# Patient Record
Sex: Female | Born: 1935 | Race: White | Hispanic: No | Marital: Married | Smoking: Never smoker
Health system: Southern US, Community
[De-identification: ages and names within clinical notes are randomized; demographics above are authoritative.]

## PROBLEM LIST (undated history)

## (undated) DIAGNOSIS — T8859XA Other complications of anesthesia, initial encounter: Secondary | ICD-10-CM

## (undated) DIAGNOSIS — I495 Sick sinus syndrome: Secondary | ICD-10-CM

## (undated) DIAGNOSIS — N183 Chronic kidney disease, stage 3 unspecified: Secondary | ICD-10-CM

## (undated) DIAGNOSIS — I209 Angina pectoris, unspecified: Secondary | ICD-10-CM

## (undated) DIAGNOSIS — M1990 Unspecified osteoarthritis, unspecified site: Secondary | ICD-10-CM

## (undated) DIAGNOSIS — M4800 Spinal stenosis, site unspecified: Secondary | ICD-10-CM

## (undated) DIAGNOSIS — I8390 Asymptomatic varicose veins of unspecified lower extremity: Secondary | ICD-10-CM

## (undated) DIAGNOSIS — I10 Essential (primary) hypertension: Secondary | ICD-10-CM

## (undated) DIAGNOSIS — R5383 Other fatigue: Secondary | ICD-10-CM

## (undated) DIAGNOSIS — F0280 Dementia in other diseases classified elsewhere without behavioral disturbance: Secondary | ICD-10-CM

## (undated) DIAGNOSIS — Z950 Presence of cardiac pacemaker: Secondary | ICD-10-CM

## (undated) DIAGNOSIS — D649 Anemia, unspecified: Secondary | ICD-10-CM

## (undated) DIAGNOSIS — E119 Type 2 diabetes mellitus without complications: Secondary | ICD-10-CM

## (undated) DIAGNOSIS — K219 Gastro-esophageal reflux disease without esophagitis: Secondary | ICD-10-CM

## (undated) DIAGNOSIS — K5790 Diverticulosis of intestine, part unspecified, without perforation or abscess without bleeding: Secondary | ICD-10-CM

## (undated) DIAGNOSIS — T4145XA Adverse effect of unspecified anesthetic, initial encounter: Secondary | ICD-10-CM

## (undated) DIAGNOSIS — C50919 Malignant neoplasm of unspecified site of unspecified female breast: Secondary | ICD-10-CM

## (undated) DIAGNOSIS — G309 Alzheimer's disease, unspecified: Secondary | ICD-10-CM

## (undated) DIAGNOSIS — I482 Chronic atrial fibrillation, unspecified: Secondary | ICD-10-CM

## (undated) DIAGNOSIS — N200 Calculus of kidney: Secondary | ICD-10-CM

## (undated) DIAGNOSIS — Z8673 Personal history of transient ischemic attack (TIA), and cerebral infarction without residual deficits: Secondary | ICD-10-CM

## (undated) DIAGNOSIS — N390 Urinary tract infection, site not specified: Secondary | ICD-10-CM

## (undated) DIAGNOSIS — E039 Hypothyroidism, unspecified: Secondary | ICD-10-CM

## (undated) HISTORY — DX: Chronic atrial fibrillation, unspecified: I48.20

## (undated) HISTORY — PX: INSERT / REPLACE / REMOVE PACEMAKER: SUR710

## (undated) HISTORY — DX: Personal history of transient ischemic attack (TIA), and cerebral infarction without residual deficits: Z86.73

## (undated) HISTORY — DX: Presence of cardiac pacemaker: Z95.0

## (undated) HISTORY — DX: Asymptomatic varicose veins of unspecified lower extremity: I83.90

## (undated) HISTORY — DX: Diverticulosis of intestine, part unspecified, without perforation or abscess without bleeding: K57.90

## (undated) HISTORY — DX: Anemia, unspecified: D64.9

## (undated) HISTORY — PX: BREAST LUMPECTOMY: SHX2

## (undated) HISTORY — DX: Other fatigue: R53.83

## (undated) HISTORY — DX: Spinal stenosis, site unspecified: M48.00

## (undated) HISTORY — DX: Gastro-esophageal reflux disease without esophagitis: K21.9

## (undated) HISTORY — PX: TONSILLECTOMY: SUR1361

## (undated) HISTORY — DX: Sick sinus syndrome: I49.5

## (undated) HISTORY — DX: Urinary tract infection, site not specified: N39.0

## (undated) HISTORY — DX: Essential (primary) hypertension: I10

## (undated) HISTORY — DX: Unspecified osteoarthritis, unspecified site: M19.90

## (undated) HISTORY — PX: EYE SURGERY: SHX253

---
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
## 1998-02-03 ENCOUNTER — Other Ambulatory Visit: Admission: RE | Admit: 1998-02-03 | Discharge: 1998-02-03 | Payer: Self-pay | Admitting: *Deleted

## 1998-02-22 ENCOUNTER — Inpatient Hospital Stay (HOSPITAL_COMMUNITY): Admission: EM | Admit: 1998-02-22 | Discharge: 1998-03-02 | Payer: Self-pay | Admitting: Emergency Medicine

## 1998-03-20 ENCOUNTER — Ambulatory Visit (HOSPITAL_COMMUNITY): Admission: RE | Admit: 1998-03-20 | Discharge: 1998-03-20 | Payer: Self-pay | Admitting: *Deleted

## 1998-04-22 ENCOUNTER — Ambulatory Visit (HOSPITAL_BASED_OUTPATIENT_CLINIC_OR_DEPARTMENT_OTHER): Admission: RE | Admit: 1998-04-22 | Discharge: 1998-04-22 | Payer: Self-pay | Admitting: Orthopedic Surgery

## 1998-06-18 ENCOUNTER — Ambulatory Visit (HOSPITAL_COMMUNITY): Admission: RE | Admit: 1998-06-18 | Discharge: 1998-06-18 | Payer: Self-pay | Admitting: Cardiology

## 1998-06-18 ENCOUNTER — Encounter: Payer: Self-pay | Admitting: Cardiology

## 1998-07-22 ENCOUNTER — Ambulatory Visit (HOSPITAL_COMMUNITY): Admission: RE | Admit: 1998-07-22 | Discharge: 1998-07-22 | Payer: Self-pay | Admitting: *Deleted

## 1998-10-21 ENCOUNTER — Encounter: Payer: Self-pay | Admitting: *Deleted

## 1998-10-21 ENCOUNTER — Ambulatory Visit (HOSPITAL_COMMUNITY): Admission: RE | Admit: 1998-10-21 | Discharge: 1998-10-21 | Payer: Self-pay | Admitting: *Deleted

## 1999-03-11 ENCOUNTER — Other Ambulatory Visit: Admission: RE | Admit: 1999-03-11 | Discharge: 1999-03-11 | Payer: Self-pay | Admitting: *Deleted

## 1999-08-24 ENCOUNTER — Encounter: Admission: RE | Admit: 1999-08-24 | Discharge: 1999-08-24 | Payer: Self-pay | Admitting: *Deleted

## 1999-08-24 ENCOUNTER — Encounter: Payer: Self-pay | Admitting: *Deleted

## 1999-10-23 ENCOUNTER — Ambulatory Visit (HOSPITAL_COMMUNITY): Admission: RE | Admit: 1999-10-23 | Discharge: 1999-10-23 | Payer: Self-pay | Admitting: Cardiology

## 1999-10-23 ENCOUNTER — Encounter: Payer: Self-pay | Admitting: Cardiology

## 2000-03-23 ENCOUNTER — Other Ambulatory Visit: Admission: RE | Admit: 2000-03-23 | Discharge: 2000-03-23 | Payer: Self-pay | Admitting: *Deleted

## 2000-03-28 ENCOUNTER — Encounter: Admission: RE | Admit: 2000-03-28 | Discharge: 2000-03-28 | Payer: Self-pay | Admitting: *Deleted

## 2000-03-28 ENCOUNTER — Encounter: Payer: Self-pay | Admitting: *Deleted

## 2000-04-28 ENCOUNTER — Encounter: Payer: Self-pay | Admitting: Emergency Medicine

## 2000-04-28 ENCOUNTER — Inpatient Hospital Stay (HOSPITAL_COMMUNITY): Admission: EM | Admit: 2000-04-28 | Discharge: 2000-04-30 | Payer: Self-pay | Admitting: Emergency Medicine

## 2000-04-29 ENCOUNTER — Encounter: Payer: Self-pay | Admitting: *Deleted

## 2000-07-01 ENCOUNTER — Encounter: Payer: Self-pay | Admitting: Neurology

## 2000-07-01 ENCOUNTER — Encounter: Admission: RE | Admit: 2000-07-01 | Discharge: 2000-07-01 | Payer: Self-pay | Admitting: Neurology

## 2000-08-14 ENCOUNTER — Inpatient Hospital Stay (HOSPITAL_COMMUNITY): Admission: AD | Admit: 2000-08-14 | Discharge: 2000-08-15 | Payer: Self-pay | Admitting: Internal Medicine

## 2000-08-14 ENCOUNTER — Encounter: Payer: Self-pay | Admitting: Internal Medicine

## 2000-09-09 ENCOUNTER — Ambulatory Visit (HOSPITAL_COMMUNITY): Admission: RE | Admit: 2000-09-09 | Discharge: 2000-09-09 | Payer: Self-pay | Admitting: Cardiology

## 2001-04-25 ENCOUNTER — Encounter: Payer: Self-pay | Admitting: Cardiology

## 2001-04-25 ENCOUNTER — Ambulatory Visit (HOSPITAL_COMMUNITY): Admission: RE | Admit: 2001-04-25 | Discharge: 2001-04-25 | Payer: Self-pay | Admitting: Cardiology

## 2001-05-05 ENCOUNTER — Other Ambulatory Visit: Admission: RE | Admit: 2001-05-05 | Discharge: 2001-05-05 | Payer: Self-pay | Admitting: *Deleted

## 2001-06-06 ENCOUNTER — Encounter: Payer: Self-pay | Admitting: Cardiology

## 2001-06-06 ENCOUNTER — Encounter: Payer: Self-pay | Admitting: *Deleted

## 2001-06-06 ENCOUNTER — Emergency Department (HOSPITAL_COMMUNITY): Admission: EM | Admit: 2001-06-06 | Discharge: 2001-06-06 | Payer: Self-pay | Admitting: *Deleted

## 2001-12-20 ENCOUNTER — Encounter: Payer: Self-pay | Admitting: *Deleted

## 2001-12-20 ENCOUNTER — Ambulatory Visit (HOSPITAL_COMMUNITY): Admission: RE | Admit: 2001-12-20 | Discharge: 2001-12-20 | Payer: Self-pay | Admitting: *Deleted

## 2002-01-02 ENCOUNTER — Inpatient Hospital Stay (HOSPITAL_COMMUNITY): Admission: RE | Admit: 2002-01-02 | Discharge: 2002-01-03 | Payer: Self-pay | Admitting: Family Medicine

## 2002-01-03 ENCOUNTER — Encounter: Payer: Self-pay | Admitting: Internal Medicine

## 2002-01-11 ENCOUNTER — Encounter: Admission: RE | Admit: 2002-01-11 | Discharge: 2002-01-11 | Payer: Self-pay | Admitting: *Deleted

## 2002-01-11 ENCOUNTER — Encounter: Payer: Self-pay | Admitting: *Deleted

## 2002-04-18 ENCOUNTER — Inpatient Hospital Stay (HOSPITAL_COMMUNITY): Admission: AD | Admit: 2002-04-18 | Discharge: 2002-04-20 | Payer: Self-pay | Admitting: Cardiology

## 2002-05-16 ENCOUNTER — Other Ambulatory Visit: Admission: RE | Admit: 2002-05-16 | Discharge: 2002-05-16 | Payer: Self-pay | Admitting: *Deleted

## 2002-06-08 ENCOUNTER — Encounter: Payer: Self-pay | Admitting: Emergency Medicine

## 2002-06-09 ENCOUNTER — Inpatient Hospital Stay (HOSPITAL_COMMUNITY): Admission: EM | Admit: 2002-06-09 | Discharge: 2002-06-11 | Payer: Self-pay | Admitting: Emergency Medicine

## 2002-06-09 ENCOUNTER — Encounter: Payer: Self-pay | Admitting: Endocrinology

## 2002-06-16 ENCOUNTER — Emergency Department (HOSPITAL_COMMUNITY): Admission: EM | Admit: 2002-06-16 | Discharge: 2002-06-16 | Payer: Self-pay | Admitting: Emergency Medicine

## 2002-06-16 ENCOUNTER — Encounter: Payer: Self-pay | Admitting: Emergency Medicine

## 2002-10-02 ENCOUNTER — Encounter: Admission: RE | Admit: 2002-10-02 | Discharge: 2002-10-02 | Payer: Self-pay | Admitting: Family Medicine

## 2002-10-02 ENCOUNTER — Encounter: Payer: Self-pay | Admitting: Family Medicine

## 2003-06-06 ENCOUNTER — Encounter: Payer: Self-pay | Admitting: Cardiology

## 2003-06-06 ENCOUNTER — Inpatient Hospital Stay (HOSPITAL_COMMUNITY): Admission: RE | Admit: 2003-06-06 | Discharge: 2003-06-08 | Payer: Self-pay | Admitting: Cardiology

## 2003-06-07 ENCOUNTER — Encounter: Payer: Self-pay | Admitting: Cardiology

## 2003-06-08 ENCOUNTER — Encounter: Payer: Self-pay | Admitting: Cardiology

## 2004-09-01 ENCOUNTER — Other Ambulatory Visit: Admission: RE | Admit: 2004-09-01 | Discharge: 2004-09-01 | Payer: Self-pay | Admitting: *Deleted

## 2004-09-22 ENCOUNTER — Encounter: Admission: RE | Admit: 2004-09-22 | Discharge: 2004-09-22 | Payer: Self-pay | Admitting: Family Medicine

## 2005-05-15 ENCOUNTER — Emergency Department (HOSPITAL_COMMUNITY): Admission: EM | Admit: 2005-05-15 | Discharge: 2005-05-15 | Payer: Self-pay | Admitting: Emergency Medicine

## 2005-08-31 ENCOUNTER — Encounter: Admission: RE | Admit: 2005-08-31 | Discharge: 2005-08-31 | Payer: Self-pay | Admitting: Family Medicine

## 2005-10-21 ENCOUNTER — Encounter: Admission: RE | Admit: 2005-10-21 | Discharge: 2005-10-21 | Payer: Self-pay | Admitting: Orthopedic Surgery

## 2006-01-18 ENCOUNTER — Other Ambulatory Visit: Admission: RE | Admit: 2006-01-18 | Discharge: 2006-01-18 | Payer: Self-pay | Admitting: Obstetrics & Gynecology

## 2006-03-07 ENCOUNTER — Ambulatory Visit (HOSPITAL_COMMUNITY): Admission: RE | Admit: 2006-03-07 | Discharge: 2006-03-07 | Payer: Self-pay | Admitting: Orthopaedic Surgery

## 2006-04-07 ENCOUNTER — Encounter: Admission: RE | Admit: 2006-04-07 | Discharge: 2006-04-07 | Payer: Self-pay | Admitting: Orthopaedic Surgery

## 2006-05-18 ENCOUNTER — Encounter (INDEPENDENT_AMBULATORY_CARE_PROVIDER_SITE_OTHER): Payer: Self-pay | Admitting: Diagnostic Radiology

## 2006-05-18 ENCOUNTER — Encounter (INDEPENDENT_AMBULATORY_CARE_PROVIDER_SITE_OTHER): Payer: Self-pay | Admitting: Specialist

## 2006-05-18 ENCOUNTER — Encounter: Admission: RE | Admit: 2006-05-18 | Discharge: 2006-05-18 | Payer: Self-pay | Admitting: Family Medicine

## 2006-06-14 ENCOUNTER — Encounter: Admission: RE | Admit: 2006-06-14 | Discharge: 2006-06-14 | Payer: Self-pay | Admitting: Surgery

## 2006-06-15 ENCOUNTER — Ambulatory Visit (HOSPITAL_BASED_OUTPATIENT_CLINIC_OR_DEPARTMENT_OTHER): Admission: RE | Admit: 2006-06-15 | Discharge: 2006-06-15 | Payer: Self-pay | Admitting: Surgery

## 2006-06-15 ENCOUNTER — Encounter (INDEPENDENT_AMBULATORY_CARE_PROVIDER_SITE_OTHER): Payer: Self-pay | Admitting: *Deleted

## 2006-06-15 ENCOUNTER — Encounter: Admission: RE | Admit: 2006-06-15 | Discharge: 2006-06-15 | Payer: Self-pay | Admitting: Surgery

## 2006-07-05 ENCOUNTER — Ambulatory Visit: Payer: Self-pay | Admitting: Oncology

## 2006-07-20 LAB — COMPREHENSIVE METABOLIC PANEL
Albumin: 3.9 g/dL (ref 3.5–5.2)
Alkaline Phosphatase: 92 U/L (ref 39–117)
CO2: 27 mEq/L (ref 19–32)
Glucose, Bld: 121 mg/dL — ABNORMAL HIGH (ref 70–99)
Potassium: 4.4 mEq/L (ref 3.5–5.3)
Sodium: 136 mEq/L (ref 135–145)
Total Protein: 6.4 g/dL (ref 6.0–8.3)

## 2006-07-20 LAB — CBC WITH DIFFERENTIAL/PLATELET
Eosinophils Absolute: 0.1 10*3/uL (ref 0.0–0.5)
MONO#: 0.6 10*3/uL (ref 0.1–0.9)
MONO%: 12.5 % (ref 0.0–13.0)
NEUT#: 3.2 10*3/uL (ref 1.5–6.5)
RBC: 3.68 10*6/uL — ABNORMAL LOW (ref 3.70–5.32)
RDW: 15.6 % — ABNORMAL HIGH (ref 11.3–14.5)
WBC: 4.8 10*3/uL (ref 3.9–10.0)
lymph#: 0.9 10*3/uL (ref 0.9–3.3)

## 2006-07-20 LAB — LACTATE DEHYDROGENASE: LDH: 221 U/L (ref 94–250)

## 2006-08-31 ENCOUNTER — Encounter: Admission: RE | Admit: 2006-08-31 | Discharge: 2006-08-31 | Payer: Self-pay | Admitting: Family Medicine

## 2006-10-09 ENCOUNTER — Inpatient Hospital Stay (HOSPITAL_COMMUNITY): Admission: EM | Admit: 2006-10-09 | Discharge: 2006-10-10 | Payer: Self-pay | Admitting: Emergency Medicine

## 2006-10-19 ENCOUNTER — Ambulatory Visit: Payer: Self-pay | Admitting: Oncology

## 2006-11-02 ENCOUNTER — Ambulatory Visit (HOSPITAL_COMMUNITY): Admission: RE | Admit: 2006-11-02 | Discharge: 2006-11-02 | Payer: Self-pay | Admitting: Gastroenterology

## 2006-11-08 ENCOUNTER — Encounter (HOSPITAL_COMMUNITY): Admission: RE | Admit: 2006-11-08 | Discharge: 2007-02-06 | Payer: Self-pay | Admitting: Gastroenterology

## 2006-12-21 ENCOUNTER — Encounter: Admission: RE | Admit: 2006-12-21 | Discharge: 2006-12-21 | Payer: Self-pay | Admitting: Family Medicine

## 2007-01-05 ENCOUNTER — Ambulatory Visit: Payer: Self-pay | Admitting: Oncology

## 2007-01-10 LAB — COMPREHENSIVE METABOLIC PANEL
Albumin: 3.7 g/dL (ref 3.5–5.2)
Alkaline Phosphatase: 95 U/L (ref 39–117)
CO2: 27 mEq/L (ref 19–32)
Calcium: 9 mg/dL (ref 8.4–10.5)
Chloride: 101 mEq/L (ref 96–112)
Glucose, Bld: 116 mg/dL — ABNORMAL HIGH (ref 70–99)
Potassium: 4.3 mEq/L (ref 3.5–5.3)
Sodium: 138 mEq/L (ref 135–145)
Total Protein: 6.5 g/dL (ref 6.0–8.3)

## 2007-01-10 LAB — CBC WITH DIFFERENTIAL/PLATELET
Basophils Absolute: 0 10*3/uL (ref 0.0–0.1)
Eosinophils Absolute: 0 10*3/uL (ref 0.0–0.5)
HGB: 10.5 g/dL — ABNORMAL LOW (ref 11.6–15.9)
MONO#: 0.6 10*3/uL (ref 0.1–0.9)
NEUT#: 2.5 10*3/uL (ref 1.5–6.5)
RBC: 3.86 10*6/uL (ref 3.70–5.32)
RDW: 16.6 % — ABNORMAL HIGH (ref 11.3–14.5)
WBC: 4.2 10*3/uL (ref 3.9–10.0)
lymph#: 1 10*3/uL (ref 0.9–3.3)

## 2007-01-10 LAB — CANCER ANTIGEN 27.29: CA 27.29: 41 U/mL — ABNORMAL HIGH (ref 0–39)

## 2007-01-27 ENCOUNTER — Other Ambulatory Visit: Admission: RE | Admit: 2007-01-27 | Discharge: 2007-01-27 | Payer: Self-pay | Admitting: Obstetrics & Gynecology

## 2007-04-18 ENCOUNTER — Encounter: Admission: RE | Admit: 2007-04-18 | Discharge: 2007-04-18 | Payer: Self-pay | Admitting: Cardiology

## 2007-06-26 ENCOUNTER — Encounter: Admission: RE | Admit: 2007-06-26 | Discharge: 2007-06-26 | Payer: Self-pay | Admitting: Family Medicine

## 2007-07-28 ENCOUNTER — Encounter: Admission: RE | Admit: 2007-07-28 | Discharge: 2007-07-28 | Payer: Self-pay | Admitting: Obstetrics & Gynecology

## 2007-09-10 ENCOUNTER — Inpatient Hospital Stay (HOSPITAL_COMMUNITY): Admission: EM | Admit: 2007-09-10 | Discharge: 2007-09-11 | Payer: Self-pay | Admitting: Emergency Medicine

## 2008-02-27 ENCOUNTER — Ambulatory Visit: Payer: Self-pay | Admitting: Oncology

## 2008-04-03 ENCOUNTER — Ambulatory Visit: Payer: Self-pay | Admitting: Oncology

## 2008-04-05 ENCOUNTER — Encounter: Admission: RE | Admit: 2008-04-05 | Discharge: 2008-04-05 | Payer: Self-pay | Admitting: Family Medicine

## 2008-04-08 LAB — CBC WITH DIFFERENTIAL/PLATELET
BASO%: 0.4 % (ref 0.0–2.0)
EOS%: 1.1 % (ref 0.0–7.0)
MCH: 31.2 pg (ref 26.0–34.0)
MCV: 92.1 fL (ref 81.0–101.0)
MONO%: 13.9 % — ABNORMAL HIGH (ref 0.0–13.0)
RBC: 3.82 10*6/uL (ref 3.70–5.32)
RDW: 13.8 % (ref 11.3–14.5)

## 2008-05-04 ENCOUNTER — Ambulatory Visit (HOSPITAL_COMMUNITY): Admission: RE | Admit: 2008-05-04 | Discharge: 2008-05-04 | Payer: Self-pay | Admitting: Family Medicine

## 2008-07-19 ENCOUNTER — Encounter: Admission: RE | Admit: 2008-07-19 | Discharge: 2008-07-19 | Payer: Self-pay | Admitting: Obstetrics & Gynecology

## 2009-01-02 ENCOUNTER — Ambulatory Visit: Payer: Self-pay | Admitting: Oncology

## 2009-01-06 LAB — CANCER ANTIGEN 27.29: CA 27.29: 35 U/mL (ref 0–39)

## 2009-01-06 LAB — COMPREHENSIVE METABOLIC PANEL
ALT: 14 U/L (ref 0–35)
AST: 21 U/L (ref 0–37)
Albumin: 3.6 g/dL (ref 3.5–5.2)
BUN: 19 mg/dL (ref 6–23)
CO2: 29 mEq/L (ref 19–32)
Calcium: 8.7 mg/dL (ref 8.4–10.5)
Chloride: 103 mEq/L (ref 96–112)
Potassium: 4.2 mEq/L (ref 3.5–5.3)

## 2009-01-06 LAB — CBC WITH DIFFERENTIAL/PLATELET
Basophils Absolute: 0 10*3/uL (ref 0.0–0.1)
EOS%: 0.8 % (ref 0.0–7.0)
HCT: 33.9 % — ABNORMAL LOW (ref 34.8–46.6)
HGB: 11.4 g/dL — ABNORMAL LOW (ref 11.6–15.9)
MCH: 30.7 pg (ref 25.1–34.0)
MONO#: 0.5 10*3/uL (ref 0.1–0.9)
NEUT#: 2.1 10*3/uL (ref 1.5–6.5)
RDW: 13.4 % (ref 11.2–14.5)
WBC: 3.7 10*3/uL — ABNORMAL LOW (ref 3.9–10.3)
lymph#: 1 10*3/uL (ref 0.9–3.3)

## 2009-01-22 ENCOUNTER — Encounter: Admission: RE | Admit: 2009-01-22 | Discharge: 2009-01-22 | Payer: Self-pay | Admitting: Cardiology

## 2009-02-05 ENCOUNTER — Encounter: Admission: RE | Admit: 2009-02-05 | Discharge: 2009-02-05 | Payer: Self-pay | Admitting: Family Medicine

## 2009-03-28 ENCOUNTER — Emergency Department (HOSPITAL_COMMUNITY): Admission: EM | Admit: 2009-03-28 | Discharge: 2009-03-29 | Payer: Self-pay | Admitting: Emergency Medicine

## 2009-05-21 ENCOUNTER — Ambulatory Visit (HOSPITAL_COMMUNITY): Admission: RE | Admit: 2009-05-21 | Discharge: 2009-05-21 | Payer: Self-pay | Admitting: Gastroenterology

## 2009-10-29 ENCOUNTER — Encounter: Admission: RE | Admit: 2009-10-29 | Discharge: 2009-10-29 | Payer: Self-pay | Admitting: Obstetrics & Gynecology

## 2009-10-30 ENCOUNTER — Ambulatory Visit: Payer: Self-pay | Admitting: Diagnostic Radiology

## 2009-10-30 ENCOUNTER — Emergency Department (HOSPITAL_BASED_OUTPATIENT_CLINIC_OR_DEPARTMENT_OTHER)
Admission: EM | Admit: 2009-10-30 | Discharge: 2009-10-30 | Payer: Self-pay | Source: Home / Self Care | Admitting: Emergency Medicine

## 2010-01-06 ENCOUNTER — Ambulatory Visit: Payer: Self-pay | Admitting: Interventional Radiology

## 2010-01-06 ENCOUNTER — Ambulatory Visit (HOSPITAL_BASED_OUTPATIENT_CLINIC_OR_DEPARTMENT_OTHER): Admission: RE | Admit: 2010-01-06 | Discharge: 2010-01-06 | Payer: Self-pay | Admitting: Family Medicine

## 2010-01-22 ENCOUNTER — Ambulatory Visit: Payer: Self-pay | Admitting: Diagnostic Radiology

## 2010-01-22 ENCOUNTER — Ambulatory Visit (HOSPITAL_BASED_OUTPATIENT_CLINIC_OR_DEPARTMENT_OTHER)
Admission: RE | Admit: 2010-01-22 | Discharge: 2010-01-22 | Payer: Self-pay | Source: Home / Self Care | Admitting: Family Medicine

## 2010-07-07 ENCOUNTER — Inpatient Hospital Stay (HOSPITAL_COMMUNITY): Admission: EM | Admit: 2010-07-07 | Discharge: 2010-07-07 | Payer: Self-pay | Admitting: Emergency Medicine

## 2010-08-14 ENCOUNTER — Ambulatory Visit (HOSPITAL_BASED_OUTPATIENT_CLINIC_OR_DEPARTMENT_OTHER)
Admission: RE | Admit: 2010-08-14 | Discharge: 2010-08-14 | Payer: Self-pay | Source: Home / Self Care | Admitting: Family Medicine

## 2010-10-03 ENCOUNTER — Encounter: Payer: Self-pay | Admitting: Surgery

## 2010-10-04 ENCOUNTER — Encounter: Payer: Self-pay | Admitting: Family Medicine

## 2010-11-25 LAB — DIFFERENTIAL
Basophils Absolute: 0 10*3/uL (ref 0.0–0.1)
Eosinophils Absolute: 0 10*3/uL (ref 0.0–0.7)
Lymphocytes Relative: 27 % (ref 12–46)
Neutro Abs: 1.6 10*3/uL — ABNORMAL LOW (ref 1.7–7.7)
Neutrophils Relative %: 60 % (ref 43–77)

## 2010-11-25 LAB — COMPREHENSIVE METABOLIC PANEL
ALT: 12 U/L (ref 0–35)
AST: 18 U/L (ref 0–37)
BUN: 10 mg/dL (ref 6–23)
CO2: 29 mEq/L (ref 19–32)
GFR calc Af Amer: >60 mL/min (ref >60)
Glucose, Bld: 99 mg/dL (ref 70–99)
Potassium: 4 mEq/L (ref 3.5–5.1)
Total Bilirubin: 0.8 mg/dL (ref 0.3–1.2)

## 2010-11-25 LAB — POCT CARDIAC MARKERS
CKMB, poc: 1.8 ng/mL (ref 1.0–8.0)
CKMB, poc: 2 ng/mL (ref 1.0–8.0)
Myoglobin, poc: 88.6 ng/mL (ref 12–200)
Troponin i, poc: <0.05 ng/mL (ref 0.00–0.09)
Troponin i, poc: <0.05 ng/mL (ref 0.00–0.09)

## 2010-11-25 LAB — CBC
Hemoglobin: 10.5 g/dL — ABNORMAL LOW (ref 12.0–15.0)
MCH: 28.7 pg (ref 26.0–34.0)
MCHC: 31.4 g/dL (ref 30.0–36.0)
RBC: 3.66 MIL/uL — ABNORMAL LOW (ref 3.87–5.11)

## 2010-11-26 ENCOUNTER — Emergency Department (HOSPITAL_BASED_OUTPATIENT_CLINIC_OR_DEPARTMENT_OTHER)
Admission: EM | Admit: 2010-11-26 | Discharge: 2010-11-26 | Disposition: A | Discharge status: Nursing Home (Includes ICF) | Payer: Medicare Other | Source: Home / Self Care | Attending: Emergency Medicine | Admitting: Emergency Medicine

## 2010-11-26 ENCOUNTER — Observation Stay (HOSPITAL_COMMUNITY)
Admission: AD | Admit: 2010-11-26 | Discharge: 2010-11-28 | Disposition: A | Discharge status: Home / Self Care | Payer: Medicare Other | Source: Other Acute Inpatient Hospital | Attending: Internal Medicine | Admitting: Internal Medicine

## 2010-11-26 ENCOUNTER — Emergency Department (INDEPENDENT_AMBULATORY_CARE_PROVIDER_SITE_OTHER): Payer: Medicare Other

## 2010-11-26 DIAGNOSIS — D649 Anemia, unspecified: Secondary | ICD-10-CM | POA: Insufficient documentation

## 2010-11-26 DIAGNOSIS — R0609 Other forms of dyspnea: Secondary | ICD-10-CM | POA: Insufficient documentation

## 2010-11-26 DIAGNOSIS — R079 Chest pain, unspecified: Secondary | ICD-10-CM

## 2010-11-26 DIAGNOSIS — Z8673 Personal history of transient ischemic attack (TIA), and cerebral infarction without residual deficits: Secondary | ICD-10-CM | POA: Insufficient documentation

## 2010-11-26 DIAGNOSIS — M4800 Spinal stenosis, site unspecified: Secondary | ICD-10-CM | POA: Insufficient documentation

## 2010-11-26 DIAGNOSIS — I4891 Unspecified atrial fibrillation: Secondary | ICD-10-CM | POA: Insufficient documentation

## 2010-11-26 DIAGNOSIS — I2510 Atherosclerotic heart disease of native coronary artery without angina pectoris: Secondary | ICD-10-CM | POA: Insufficient documentation

## 2010-11-26 DIAGNOSIS — I779 Disorder of arteries and arterioles, unspecified: Secondary | ICD-10-CM | POA: Insufficient documentation

## 2010-11-26 DIAGNOSIS — R0989 Other specified symptoms and signs involving the circulatory and respiratory systems: Secondary | ICD-10-CM | POA: Insufficient documentation

## 2010-11-26 DIAGNOSIS — Z86718 Personal history of other venous thrombosis and embolism: Secondary | ICD-10-CM | POA: Insufficient documentation

## 2010-11-26 DIAGNOSIS — Z950 Presence of cardiac pacemaker: Secondary | ICD-10-CM | POA: Insufficient documentation

## 2010-11-26 DIAGNOSIS — R1013 Epigastric pain: Secondary | ICD-10-CM

## 2010-11-26 DIAGNOSIS — R0602 Shortness of breath: Secondary | ICD-10-CM | POA: Insufficient documentation

## 2010-11-26 DIAGNOSIS — Z79899 Other long term (current) drug therapy: Secondary | ICD-10-CM | POA: Insufficient documentation

## 2010-11-26 DIAGNOSIS — M1990 Unspecified osteoarthritis, unspecified site: Secondary | ICD-10-CM | POA: Insufficient documentation

## 2010-11-26 DIAGNOSIS — E785 Hyperlipidemia, unspecified: Secondary | ICD-10-CM | POA: Insufficient documentation

## 2010-11-26 DIAGNOSIS — K449 Diaphragmatic hernia without obstruction or gangrene: Secondary | ICD-10-CM | POA: Insufficient documentation

## 2010-11-26 DIAGNOSIS — I10 Essential (primary) hypertension: Secondary | ICD-10-CM | POA: Insufficient documentation

## 2010-11-26 DIAGNOSIS — H8109 Meniere's disease, unspecified ear: Secondary | ICD-10-CM | POA: Insufficient documentation

## 2010-11-26 DIAGNOSIS — Z8679 Personal history of other diseases of the circulatory system: Secondary | ICD-10-CM | POA: Insufficient documentation

## 2010-11-26 LAB — POCT CARDIAC MARKERS
CKMB, poc: 2.5 ng/mL (ref 1.0–8.0)
Troponin i, poc: <0.05 ng/mL (ref 0.00–0.09)

## 2010-11-26 LAB — URINALYSIS, ROUTINE W REFLEX MICROSCOPIC
Bilirubin Urine: NEGATIVE
Hgb urine dipstick: NEGATIVE
Ketones, ur: NEGATIVE mg/dL
Nitrite: NEGATIVE
Protein, ur: NEGATIVE mg/dL
Urobilinogen, UA: 0.2 mg/dL (ref 0.0–1.0)

## 2010-11-26 LAB — CBC
Platelets: 238 10*3/uL (ref 150–400)
RBC: 3.48 MIL/uL — ABNORMAL LOW (ref 3.87–5.11)
WBC: 3.9 10*3/uL — ABNORMAL LOW (ref 4.0–10.5)

## 2010-11-26 LAB — DIFFERENTIAL
Basophils Relative: 0 % (ref 0–1)
Eosinophils Absolute: 0 10*3/uL (ref 0.0–0.7)
Eosinophils Relative: 1 % (ref 0–5)
Lymphocytes Relative: 26 % (ref 12–46)
Lymphs Abs: 1 10*3/uL (ref 0.7–4.0)
Monocytes Absolute: 0.5 10*3/uL (ref 0.1–1.0)
Monocytes Relative: 14 % — ABNORMAL HIGH (ref 3–12)
Neutro Abs: 2.3 10*3/uL (ref 1.7–7.7)

## 2010-11-27 LAB — COMPREHENSIVE METABOLIC PANEL WITH GFR
ALT: 14 U/L (ref 0–35)
AST: 22 U/L (ref 0–37)
Albumin: 3.2 g/dL — ABNORMAL LOW (ref 3.5–5.2)
Alkaline Phosphatase: 75 U/L (ref 39–117)
BUN: 14 mg/dL (ref 6–23)
CO2: 30 meq/L (ref 19–32)
Calcium: 8.5 mg/dL (ref 8.4–10.5)
Chloride: 106 meq/L (ref 96–112)
Creatinine, Ser: 1.19 mg/dL (ref 0.4–1.2)
GFR calc non Af Amer: 43 mL/min — ABNORMAL LOW
Glucose, Bld: 199 mg/dL — ABNORMAL HIGH (ref 70–99)
Potassium: 3.7 meq/L (ref 3.5–5.1)
Sodium: 141 meq/L (ref 135–145)
Total Bilirubin: 0.6 mg/dL (ref 0.3–1.2)
Total Protein: 5.7 g/dL — ABNORMAL LOW (ref 6.0–8.3)

## 2010-11-27 LAB — CARDIAC PANEL(CRET KIN+CKTOT+MB+TROPI)
CK, MB: 4.8 ng/mL — ABNORMAL HIGH (ref 0.3–4.0)
Total CK: 136 U/L (ref 7–177)
Total CK: 126 U/L (ref 7–177)
Troponin I: 0.02 ng/mL (ref 0.00–0.06)

## 2010-11-27 LAB — CBC
HCT: 28.5 % — ABNORMAL LOW (ref 36.0–46.0)
Hemoglobin: 8.7 g/dL — ABNORMAL LOW (ref 12.0–15.0)
MCH: 25.9 pg — ABNORMAL LOW (ref 26.0–34.0)
MCHC: 30.5 g/dL (ref 30.0–36.0)
MCV: 84.8 fL (ref 78.0–100.0)
Platelets: 215 10*3/uL (ref 150–400)
RBC: 3.36 MIL/uL — ABNORMAL LOW (ref 3.87–5.11)
RDW: 16.5 % — ABNORMAL HIGH (ref 11.5–15.5)
WBC: 4 10*3/uL (ref 4.0–10.5)

## 2010-11-27 LAB — LIPID PANEL
HDL: 47 mg/dL
Total CHOL/HDL Ratio: 5.1 ratio
Triglycerides: 118 mg/dL
VLDL: 24 mg/dL (ref 0–40)

## 2010-11-27 LAB — FOLATE: Folate: >20 ng/mL

## 2010-11-27 LAB — FERRITIN: Ferritin: 16 ng/mL (ref 10–291)

## 2010-11-27 LAB — VITAMIN B12: Vitamin B-12: 308 pg/mL (ref 211–911)

## 2010-11-27 LAB — MAGNESIUM: Magnesium: 2.1 mg/dL (ref 1.5–2.5)

## 2010-11-27 LAB — D-DIMER, QUANTITATIVE

## 2010-11-28 LAB — URINE CULTURE
Culture: NO GROWTH
Special Requests: NEGATIVE

## 2010-11-28 LAB — PROTIME-INR: Prothrombin Time: 17.8 seconds — ABNORMAL HIGH (ref 11.6–15.2)

## 2010-12-03 LAB — PROTIME-INR
INR: 1.85 — ABNORMAL HIGH (ref 0.00–1.49)
Prothrombin Time: 21.2 seconds — ABNORMAL HIGH (ref 11.6–15.2)

## 2010-12-03 LAB — POCT CARDIAC MARKERS
CKMB, poc: 2.5 ng/mL (ref 1.0–8.0)
Myoglobin, poc: 72.1 ng/mL (ref 12–200)
Troponin i, poc: <0.05 ng/mL (ref 0.00–0.09)

## 2010-12-03 LAB — CBC
MCV: 89.4 fL (ref 78.0–100.0)
Platelets: 164 10*3/uL (ref 150–400)
RBC: 3.58 MIL/uL — ABNORMAL LOW (ref 3.87–5.11)
WBC: 3.9 10*3/uL — ABNORMAL LOW (ref 4.0–10.5)

## 2010-12-03 LAB — DIFFERENTIAL
Eosinophils Absolute: 0 10*3/uL (ref 0.0–0.7)
Lymphocytes Relative: 21 % (ref 12–46)
Lymphs Abs: 0.8 10*3/uL (ref 0.7–4.0)
Monocytes Relative: 14 % — ABNORMAL HIGH (ref 3–12)
Neutrophils Relative %: 63 % (ref 43–77)

## 2010-12-03 LAB — BASIC METABOLIC PANEL
BUN: 15 mg/dL (ref 6–23)
Chloride: 103 mEq/L (ref 96–112)
Creatinine, Ser: 0.9 mg/dL (ref 0.4–1.2)
GFR calc Af Amer: >60 mL/min (ref >60)
GFR calc non Af Amer: 59 mL/min — ABNORMAL LOW (ref >60)
Potassium: 4 mEq/L (ref 3.5–5.1)

## 2010-12-10 NOTE — Discharge Summary (Signed)
  [name], [name]           ACCOUNT NO.:  [id number]  MEDICAL RECORD NO.:  [id number]           PATIENT TYPE:  I  LOCATION:  4733                         FACILITY:  [facility]  PHYSICIAN:  Zannie Cove, MD     DATE OF BIRTH:  Jul 31, 1920  DATE OF ADMISSION:  11/26/2010 DATE OF DISCHARGE:  11/28/2010                              DISCHARGE SUMMARY   PRIMARY CARE PHYSICIAN:  Robert L. Foy Guadalajara, MD  CARDIOLOGIST:  Jake Bathe, MD  DISCHARGE DIAGNOSES: 1. Transient chest tightness and shortness of breath resolved. 2. History of paroxysmal atrial fibrillation status post pacemaker for     tachybrady syndrome. 3. History of cerebrovascular accident. 4. Remote history of deep venous thrombosis. 5. History of pacemaker. 6. Hiatal hernia. 7. History of carotid artery disease. 8. History of osteoarthritis. 9. History of spinal stenosis. 10.History of Meniere's disease. 11.Dyslipidemia.  DIAGNOSTIC INVESTIGATIONS: 1. Chest x-ray March 15, no acute abnormalities.  D-dimer negative. 2. A 2-D echocardiogram March 16 showed LV cavity size normal, mild     LVH, ejection fraction 55-60%, no regional wall motion     abnormalities and no significant valvular disease.  HOSPITAL COURSE: 1. [name] is an 77 year old female with paroxysmal atrial     fibrillation status post pacemaker on Coumadin presented to the     hospital with transient chest discomfort and shortness of breath.     She was ruled out for acute MI based on EKG, normal cardiac     enzymes, and normal 2-D echocardiogram.  She also had a D-dimer     done which was negative.  Her symptoms actually improved prior to     arrival to the emergency room.  She was admitted for observation     for this.  The patient has been adamant to leave the hospital since     clinical improvement and hence we will defer the need for possible     outpatient for possible stress testing to primary cardiologist and     primary physician.   The patient is clinically stable and I do not     suspect acute MI as the etiology of her symptoms.  However, we will     defer the need for stress testing to her regular doctor since she     is adamant to leave at this point. 2. Paroxysmal atrial fibrillation rate controlled.  Continued on     diltiazem and Coumadin.  It is recommended to get her INR checked     in 1 week.  Rest of chronic medical problems remained stable.  DISCHARGE CONDITION:  Stable.  Vital Signs:  Temperature is 97, pulse 87, blood pressure 140/87, respirations 18, satting 98% room air.  DISCHARGE FOLLOWUP: 1. Primary physician, Dr. Marinda Elk in 1 week. 2. Dr. Donato Schultz in 2 weeks.     Zannie Cove, MD     PJ/MEDQ  D:  11/28/2010  T:  11/29/2010  Job:  [id number]  cc:   Molly Maduro L. Foy Gu[place]ara, M.D. Jake Bathe, MD  Electronically Signed by Zannie Cove  on 12/10/2010 05:27:59 PM

## 2010-12-14 NOTE — H&P (Signed)
[name], [name]           ACCOUNT NO.:  [id number]  MEDICAL RECORD NO.:  [id number]           PATIENT TYPE:  LOCATION:                                 FACILITY:  PHYSICIAN:  [name], MDDATE OF BIRTH:  1920-06-28  DATE OF ADMISSION: DATE OF DISCHARGE:                             HISTORY & PHYSICAL   PRIMARY CARE PHYSICIAN:  Dawn A. Reade, MD  CHIEF COMPLAINTS:  Chest discomfort and shortness of breath.  HISTORY OF PRESENT ILLNESS:  An 77 year old female with known history of atrial fibrillation status post pacemaker placement, hypertension, history of cardiac surgery for a septal defect in 1965.  The patient does not know to tell exactly what was the reason.  Previous history of DVT presents with complaint of sudden onset of shortness of breath and chest pain.  The patient states the whole thing happened around 2 o'clock afternoon, lasted until she came to the ER and presently she is symptom free.  The chest pain was retrosternal, radiating to throat, had no associated diaphoresis, nausea, vomiting.  Denies any dizziness or loss of consciousness.  Along with that, she had mild shortness of breath which was present even at rest.  In the ER, the patient had cardiac enzymes which were negative.  EKG did not show anything acute. Cardiology on-call, Dr. Aldona Bryan, was called and Dr. Mayford Bryan had advised medical admission.  The patient denies any nausea, vomiting, abdominal pain, dysuria, discharge, diarrhea.  Denies any focal deficit.  Presently, the patient is not short of breath, is having her dinner.  PAST MEDICAL HISTORY: 1. Meniere disease. 2. Arthritis. 3. Carotid disease. 4. DVT. 5. Headache. 6. Hiatal hernia. 7. Hypertension. 8. Pacemaker.  PAST SURGICAL HISTORY: 1. Pacemaker placement. 2. Appendectomy. 3. Tumor removal from ovary. 4. Has had colonoscopy in 2010. 5. Three surgeries for Meniere disease.  MEDICATIONS ON ADMISSION:  At  this time, we do not know the exact dose. Medications include diltiazem, Levoxyl, Coumadin, oxycodone, and ursodiol.  FAMILY HISTORY:  Nothing contributory.  ALLERGIES:  AMIODARONE, ASPIRIN, ATIVAN, CAFFEINE, CODEINE, DARVOCET, DILAUDID, DIMETAPP, DYAZIDE.  SOCIAL HISTORY:  The patient lives alone.  Her husband just died couple of months ago.  Denies smoking cigarettes, drinking alcohol, or using illegal drugs.  The patient is a full code.  The patient does not have any family members in [place].  Does not have children.  REVIEW OF SYSTEMS:  As per history of present illness nothing else significant.  PHYSICAL EXAMINATION:  GENERAL:  The patient examined at bedside, not in acute distress. VITAL SIGNS:  Blood pressure is 154/86, pulse is 89 per minute, temperature 98.2, respiration is 20 per minute, O2 sat is 100%. HEENT:  Anicteric.  No pallor.  No discharge from ears, eyes, nose, or mouth. CHEST:  Bilateral air entry present.  No rhonchi.  No crepitation. HEART:  S1 and S2 heard. ABDOMEN:  Soft.  There is mild tenderness in the right upper quadrant which the patient states is chronic from her chronic gallstones.  Bowel sounds present.  No guarding.  No rigidity. CNS:  The patient alert, awake, oriented to time, place, and person. Moves  upper and lower extremities 5/5. EXTREMITIES:  Peripheral pulses felt.  There is no acute ischemic changes, cyanosis, or clubbing.  LABORATORY DATA:  EKG shows atrial fibrillation versus junctional rhythm, heart rate is around 84 beats per minute.  Narrow QRS complex with nonspecific ST-T changes.  Chest x-ray shows no acute abnormalities, probable pulmonary arterial hypertension, and chronic cardiomegaly.  CBC, WBC is 3.9, hemoglobin is 9.2, hematocrit is 29.2, platelets 238,000.  PT and INR is 22.1 and 1.9.  CK-MB is 2.5, troponin- I less than 0.05.  Hemoglobin 17.4.  UA shows moderate leukocytes, wbc 7- 10, bacteria  rare.  ASSESSMENT: 1. Shortness of breath with chest discomfort rule out acute coronary     syndrome. 2. History of atrial fibrillation status post pacemaker placed on     Coumadin. 3. Anemia which is chronic, guaiac is negative. 4. History of hypertension. 5. History of hypothyroidism. 6. Previous history of deep vein thrombosis. 7. Possible urinary tract infection.  PLAN: 1. At this time, we will admit the patient to telemetry. 2. For chest discomfort and shortness of breath, at this time we are     going to get d-Dimer.  We will cycle cardiac markers, 2-D echo.     The patient allergic to ASPIRIN, so we will continue on the     Coumadin as per pharmacy at this time. 3. If her D-dimer is positive, we will need to get a CT angio chest or     VQ scan based on her creatinine for which I am going to order a     stat CMET which has not been done yet.  We will recheck CBC and     make sure her hemoglobin is stable. 4. History of hypertension, hypothyroidism, and chronic gallstones.     We will continue present medications.  Her     present medication dose is not known.  We need to restart as     appropriate. 5. At this time, Coumadin will be continued as per pharmacy. 6. Further recommendation as condition evolves.  The patient is a full     code.     Dawn Clos, MD     ANK/MEDQ  D:  11/27/2010  T:  11/27/2010  Job:  [id number]  cc:   [name], M.D.  Electronically Signed by Midge Minium MD on 12/14/2010 07:51:12 AM

## 2011-01-11 ENCOUNTER — Other Ambulatory Visit: Payer: Self-pay | Admitting: Family Medicine

## 2011-01-11 DIAGNOSIS — N6321 Unspecified lump in the left breast, upper outer quadrant: Secondary | ICD-10-CM

## 2011-01-20 ENCOUNTER — Ambulatory Visit
Admission: RE | Admit: 2011-01-20 | Discharge: 2011-01-20 | Disposition: A | Discharge status: Home / Self Care | Payer: Medicare Other | Source: Ambulatory Visit | Attending: Family Medicine | Admitting: Family Medicine

## 2011-01-20 ENCOUNTER — Ambulatory Visit
Admission: RE | Admit: 2011-01-20 | Discharge: 2011-01-20 | Disposition: A | Discharge status: Home / Self Care | Payer: BC Managed Care – PPO | Source: Ambulatory Visit | Attending: Family Medicine | Admitting: Family Medicine

## 2011-01-20 ENCOUNTER — Other Ambulatory Visit: Payer: Self-pay | Admitting: Family Medicine

## 2011-01-20 DIAGNOSIS — N6321 Unspecified lump in the left breast, upper outer quadrant: Secondary | ICD-10-CM

## 2011-01-26 NOTE — Discharge Summary (Signed)
[name], [name]           ACCOUNT NO.:  [id number]   MEDICAL RECORD NO.:  [id number]          PATIENT TYPE:  INP   LOCATION:  3730                         FACILITY:  [facility]   PHYSICIAN:  Corinna L. Lendell Caprice, MDDATE OF BIRTH:  05-Apr-1920   DATE OF ADMISSION:  09/10/2007  DATE OF DISCHARGE:  09/10/2007                               DISCHARGE SUMMARY   DISCHARGE DIAGNOSES:  1. Left shoulder and jaw pain, myocardial infarction ruled out.  2. History of spinal stenosis.  3. History of atrial fibrillation on Coumadin.  4. Pacemaker.  5. Hypertension.  6. Hypothyroidism.  7. Meniere's disease.  8. Hyperlipidemia.   DISCHARGE MEDICATIONS:  Are the same as upon admission.   CONDITION ON DISCHARGE:  Stable.   ACTIVITY:  Ad lib.   FOLLOWUP:  Followup with Dr. Foy Bryan as needed.   PROCEDURES:  None.   DIET:  Diet should be cardiac, low cholesterol.   PERTINENT LABS:  Serial cardiac enzymes were negative.  CBC significant  for white blood cell count of 3.4 with 52% neutrophils, 31% lymphocytes,  14% monocytes, otherwise unremarkable CBC.  INR 2.6.  Basic metabolic  panel unremarkable.   SPECIAL STUDIES AND RADIOLOGY:  EKG showed no change from previous per  H&P.  The EKG is not currently on the chart.   HISTORY AND HOSPITAL COURSE:  [name] is an 77 year old white  female who presented to the emergency room with left shoulder and jaw  pain.  Please see H&P for complete admission details.  She was  admitted to rule out anginal equivalent. Her pain resolved  spontaneously.  She had serial cardiac enzymes, which were unremarkable.  At the time of discharge she was feeling much better.  I suspect this  was musculoskeletal in nature. Her other medical problems remained  stable during her hospitalization.      Corinna L. Lendell Caprice, MD  Electronically Signed     CLS/MEDQ  D:  09/11/2007  T:  09/11/2007  Job:  [id number]   cc:   [name], M.D.  [name],  M.D.

## 2011-01-26 NOTE — H&P (Signed)
NAME:  [name], [name]           ACCOUNT NO.:  [id number]   MEDICAL RECORD NO.:  [id number]          PATIENT TYPE:  EMS   LOCATION:  MAJO                         FACILITY:  [facility]   PHYSICIAN:  Michelene Gardener, MD    DATE OF BIRTH:  [birth year]   DATE OF ADMISSION:  09/10/2007  DATE OF DISCHARGE:                              HISTORY & PHYSICAL   PRIMARY PHYSICIAN:  Robert L. Foy Guadalajara, M.D.   CHIEF COMPLAINT:  Left shoulder and jaw pain.   HISTORY OF PRESENT ILLNESS:  This is an 77 year old female with a past  medical history of arthritis, chronic headache, hypertension, and atrial  fibrillation who presented with the above-mentioned complaint.  The  patient's last hospitalization was on January 28th and at that time she  was admitted with a transient ischemic attack.  She woke up around 5 in  the morning today with severe pain and numbness that affected her left  upper extremity and extended to her left shoulder.  Her pain was 7-8 out  of 10, as mentioned it radiated to her shoulder and her left jaw.  She  denied chest pain.  She denied shortness of breath.  There were no  palpitations and there was no syncope.  The patient has never had such  kind of pain before.  She came into the hospital where her EKG was done  and it showed the same findings from her previous.  The hospitalist  service was called for further evaluation.   PAST MEDICAL HISTORY:  1. History of atrial fibrillation, status post pacemaker around 3-1/2      years ago.  Currently, the patient is on chronic Coumadin.  2. Hypertension.  3. Hypothyroidism.  4. Spinal stenosis.  5. Meniere's disease.  6. History of hyperlipidemia which is diet-controlled.   CURRENT MEDICATIONS:  1. Betapace 80 mg once a day.  2. Mavik 4 mg once a day.  3. Diltiazem 120 mg once a day.  4. Coumadin dosed by INR.  5. Multivitamin one tab p.o. once daily.  6. The patient thinks that she might be on thyroid medication.   PAST SURGICAL  HISTORY:  1. Status post appendectomy at age 53.  2. Status post tonsillectomy.   ALLERGIES:  The patient has multiple allergies close to 40 medications,  some of those medications include:  1. PENICILLIN.  2. CODEINE.  3. PREDNISONE.  4. DILAUDID.  5. NITROGLYCERIN.  6. AMIODARONE.  7. ASPIRIN.  8. ATIVAN.  9. CAFFEINE.  10.DARVOCET.  11.DYAZIDE.  The rest of the medications were already listed on this patient's  previous records.   SOCIAL HISTORY:  Denies smoking, denies alcohol drinking, and denies  recreational drugs.   FAMILY HISTORY:  Noncontributory.   REVIEW OF SYSTEMS:  As per HPI.   PHYSICAL EXAMINATION:  VITAL SIGNS:  Temperature is 97.1, blood pressure  is 148/84, pulse is 92, respiratory rate 18.  GENERAL APPEARANCE:  This is an elderly Caucasian female, not in acute  distress.  HEENT:  Her conjunctivae are pink.  Her pupils are equal, reactive to  light.  There is no ptosis.  Hearing  is intact. There is no ear  discharge or infection.  There is no nose discharge, infection, or  bleeding.  Oral mucosa is dry.  No pharyngeal erythema.  NECK:  Supple.  No JVD.  No carotid bruits.  No lymphadenopathy.  No  thyroid enlargement.  No thyroid tenderness.  CARDIOVASCULAR:  S1 and S2 are regular.  There are no murmurs, no  gallops, and no thrills.  RESPIRATORY:  The patient is breathing between 16-18.  There is no use  of accessory muscles.  There are no intercostal retractions.  There is  no dullness, no rales, no rhonchi, no wheeze.  ABDOMEN:  The abdomen is soft, not distended, no tenderness, no  hepatosplenomegaly.  Bowel sounds are normal.  The umbilicus is central.  LOWER EXTREMITIES:  No edema.  No rash.  No varicose veins.  SKIN:  No rash and no erythema.  NEURO:  Cranial nerves are intact from II-XII.  There are no motor or  sensory deficits.   LABORATORY RESULTS:  Sodium 138, potassium 4.1, chloride 106, bicarb  28.3, BUN 12, creatinine is 1.1.  WBC  3.4, hemoglobin 12.5, hematocrit  38.8, platelet count is 243.  Myoglobin 115, CK-MB 1.7, troponin less  than 0.05.  INR 2.6.  EKG showed no acute findings and no change from  the last EKG.   IMPRESSION:  1. Acute coronary syndrome.  2. Atrial fibrillation.  3. Hypertension.  4. Hypothyroidism.  5. Spinal stenosis.  6. Meniere's disease.   PLAN:  This patient presented with pain and numbness that include her  left upper extremity and her left shoulder and the left side of her jaw.  With her age and her atrial fibrillation, definitely I would like to  rule her out for an MI.  I will admit her to telemetry.  We will get 3  sets of troponin and cardiac enzymes.  We will get serial EKGs.  This  patient has multiple allergies, close to 40 medications.  I will not  start her on any new medications at this time because of her multiple  allergies.  She is already allergic to aspirin.  She is allergic to  __________  .  She is allergic to nitroglycerin.  I will discontinue her  Betapace for now and will watch her very closely.  If her condition  worsens or we detect anything abnormal in her pending tests, then we  will consider a cardiology evaluation with her cardiologist, Dr.  Amil Amen.  This patient had an admission in January 2008, for left sided numbness  and at that time she was ruled out and there is no evidence of any  neurological abnormalities.  Her symptoms were attributed to her spinal  stenosis.  This again might explain her current condition but first of  all I would like to rule her out and if she develops any neurological  symptoms, then I will rule her out for transient ischemic attack.   DISPOSITION:  Otherwise, other medical conditions remain stable and we  will continue the rest of her medications taken at home.   Total assessment time is 1 hour.      Michelene Gardener, MD  Electronically Signed     NAE/MEDQ  D:  09/10/2007  T:  09/10/2007  Job:  [id number]   cc:    Molly Maduro L. Foy [place], M.D.

## 2011-01-29 NOTE — Op Note (Signed)
[name], [name]           ACCOUNT NO.:  [id number]   MEDICAL RECORD NO.:  [id number]          PATIENT TYPE:  AMB   LOCATION:  DSC                          FACILITY:  [facility]   PHYSICIAN:  Thomas A. Cornett, M.D.DATE OF BIRTH:  1920/07/12   DATE OF PROCEDURE:  06/15/2006  DATE OF DISCHARGE:  06/15/2006                                 OPERATIVE REPORT   PREOPERATIVE DIAGNOSIS:  Right breast cancer.   POSTOPERATIVE DIAGNOSIS:  Right breast cancer.   PROCEDURE:  1. Right breast lumpectomy and needle localization.  2. Right sentinel lymph node mapping with injection of 4 mL of methylene      blue dye.   SURGEON:  Maisie Fus A. Cornett, M.D.   ANESTHESIA:  LMA with 20 mL of 0.25% bupivacaine with epinephrine.   ESTIMATED BLOOD LOSS:  20 mL.   SPECIMEN:  1. Right axillary sentinel lymph nodes x2, negative by touch prep for any      significant disease, some question of cellular atypia but not enough to      call it metastatic disease.  2. Right breast lumpectomy specimen verified to be adequate by      radiographic analysis of the specimen.   DRAINS:  None.   INDICATIONS FOR PROCEDURE:  The patient is an 77 year old female with a very  small right breast cancer in the upper mid breast on the right.  She is  brought in today for lumpectomy by needle localization and right sentinel  lymph node mapping.  The procedure was explained to the patient as well as  potential complications.  She voiced understanding and agreed to proceed.   DESCRIPTION OF PROCEDURE:  The patient was brought to the operating suite  after [facility]  and preoperative injection of technetium sulfur colloid for sentinel lymph  node mapping.  After induction of general anesthesia, the right breast and  right axilla were prepped and draped in sterile fashion with chlorhexidine.  The sentinel node was addressed first.  The NeoProbe was used to get  tumor  counts and them right axillary counts.  A small incision was made in the  right axilla.  Dissection was carried down into the area of the level I  nodes.  The NeoProbe was used.  Two blue and hot sentinel nodes were  removed.  Background counts were essentially 0 after removal of these nodes.  A touch prep revealed no obvious metastatic disease, question, these are  going to be put through for permanent sections since there was some  questionable cellular atypia but nothing that could be called metastatic  disease.  At this point, I completed the sentinel node biopsy.  Of note, we  used 4 mL of methylene blue dye injected a subareolar position to help with  this.  This was done prior to prepping the skin.   Next, the lumpectomy site was identified.  An incision was made around the  localizing wire.  The tissue around the wire was excised in its entirety and  sent to radiology for a specimen radiograph which showed the previous  clip  to be present from biopsy site.  Hemostasis was excellent.  I took some  additional lateral deep tissue essentially close to the wire.  This was  labeled and sent to pathology for further evaluation.  The remainder of the  lumpectomy cavity appeared hemostatic with no signs of bleeding.  This was  irrigated and suctioned out.  I saw no evidence of any residual tumor.  The  cavity was closed with 3-0 Vicryl and the skin was closed with 4-0 Monocryl  subcuticular stitches.  The right axilla was closed with 3-0 Vicryl and 4-0  Monocryl.  All final counts of sponge, needle, and instruments were found to  be correct for this portion of the case.  The patient was awakened and taken  to recovery in satisfactory condition.  All final counts were correct.      Thomas A. Cornett, M.D.  Electronically Signed     TAC/MEDQ  D:  06/15/2006  T:  06/17/2006  Job:  [id number]   cc:   Molly Maduro L. Foy [place], M.D.

## 2011-01-29 NOTE — H&P (Signed)
NAME:  [name], [name]                     ACCOUNT NO.:  [id number]   MEDICAL RECORD NO.:  [id number]                   PATIENT TYPE:  INP   LOCATION:  3708                                 FACILITY:  [facility]   PHYSICIAN:  Sean A. Everardo All, M.D. [facility]           DATE OF BIRTH:  1920/02/26   DATE OF ADMISSION:  06/08/2002  DATE OF DISCHARGE:  06/11/2002                                HISTORY & PHYSICAL   REASON FOR ADMISSION:  Hypertension.   HISTORY OF PRESENT ILLNESS:  The patient is a 77 year old woman with one day  of nonvertiginous lightheadedness with associated bitemporal headache which  is nonpulsatile in quality.  There is also slight nausea.  Home health nurse  noted elevated blood pressure and referred patient to the emergency room.  The patient states she takes her medications as prescribed.   PAST MEDICAL HISTORY:  1. Atrial fibrillation, resolved.  2. Hypothyroidism.  3. Hypertension.   MEDICATIONS:  1. Hydrochlorothiazide 25 mg a day.  2. Potassium chloride, uncertain dose.  3. Synthroid 50 mcg daily.  4. Coumadin 2.5 mg daily.  5. Betapace 80 mg q.a.m., 40 mg q.p.m.   SOCIAL HISTORY:  The patient is married.  She is retired.   FAMILY HISTORY:  The patient's father had a massive stroke at the age of 62  and died.   REVIEW OF SYSTEMS:  Denies the following, chest pain, shortness of breath,  blurry vision, loss of consciousness, fever, chills, abdominal pain, melena,  bright red blood per rectum, and hematuria.   PHYSICAL EXAMINATION:  VITAL SIGNS:  Blood pressure 204/116, heart rate 66,  respiratory rate 20, temperature 97.5,  GENERAL:  No distress.  SKIN:  Not diaphoretic.  HEENT  Head is atraumatic. Sclerae nonicteric.  Pharynx clear.  NECK:  Supple.  CHEST: Clear to auscultation.  CARDIOVASCULAR:  No JVD nor edema.  Regular rate and rhythm.  No murmur.  Pedal pulses are intact.  ABDOMEN: Soft, obese, nontender.  No hepatosplenomegaly,no mass.  BREASTS/GYN/RECTAL:  Examinations not done at this time due to patient's  condition.  EXTREMITIES:  Mild osteoarthritic changes.  NEUROLOGIC:  Alert and well oriented.  Cranial nerves grossly intact.  The patient moves all fours.   LABORATORY DATA:  Electrocardiogram shows prolonged QT interval.   Sodium 122.  Remainder of BMET normal.   IMPRESSION:  1. Episode of severe hypertension of uncertain etiology.  2. Hyponatremia.   PLAN:  1. Discontinue hydrochlorothiazide.  2. Fluid restriction.  3. Increase Betapace.  4. Check TSH and cortisol.  5. Catapres as needed to control blood pressure to the slightly hypertensive     range.                                               Sean A. Everardo All, M.D. [facility]  SAE/MEDQ  D:  06/09/2002  T:  06/11/2002  Job:  [id number]

## 2011-01-29 NOTE — Op Note (Signed)
NAME:  [name], [name]                     ACCOUNT NO.:  [id number]   MEDICAL RECORD NO.:  [id number]                   PATIENT TYPE:  OIB   LOCATION:  2893                                 FACILITY:  [facility]   PHYSICIAN:  Francisca December, M.D.               DATE OF BIRTH:  1920/01/25   DATE OF PROCEDURE:  06/06/2003  DATE OF DISCHARGE:                                 OPERATIVE REPORT   PROCEDURE PERFORMED:  1. Insertion of dual-chamber permanent transvenous pacemaker.  2. Left subclavian venogram.   INDICATIONS:  [name] is an 77 year old woman with a long history  of paroxysmal atrial fibrillation.  This has most recently been controlled  with Sotalol 80 mg twice daily.  This has resulted in resting heart rate in  the 40 to 50 beats per minute range.  She is quite symptomatic with this.  She experiences excessive fatigue.  She is brought now to the  catheterization laboratory for insertion of dual-chamber permanent  transvenous pacemaker with a diagnosis of tachy-brady syndrome.   PROCEDURAL NOTE:  The patient was brought to the cardiac catheterization  laboratory in the fasting state.  The left prepectoral region was prepped  and draped in the usual sterile fashion.  Local anesthesia was obtained with  the infiltration of 1% lidocaine with epinephrine throughout.  A 6 to 7-cm  incision was made in the deltopectoral groove and this was carried down by  sharp dissection and electrocautery to the prepectoral fascia.  There a  plane was lifted and a pocket formed inferiorly and medially using blunt  dissection and electrocautery.   Through a previously placed left arm peripheral IV, the patient received 20  cc of Omnipaque by manual injection.  A digital cineangiogram was obtained  and road mapped to guide future left subclavian puncture.  The left  subclavian venogram did reveal the left subclavian vein to be widely patent  and coursing in a normal fashion over the  anterior surface of the first rib  and beneath the middle third of the clavicle.  There was no evidence for  persistence of the left superior vena cava.   The left subclavian vein was then punctured using a micro puncture needle.  The vein was subsequently cannulated via a 4 French catheter and a 0.038  wire advanced without difficulty.  A 9 French tear-away sheath and dilator  were advanced over this wire.  The dilator was removed.  The wire was  allowed to remain in place.  The ventricular lead was advanced to the level  of the right atrium.  The sheath was then torn away.  Using standard  technique and fluoroscopic landmarks, the lead was manipulated into the  right ventricular apex.  There, excellent pacing parameters were obtained as  will be noted below.  The lead was tested for diaphragmatic pacing and none  was found.  The lead was then sutured into place using  three separate 0 silk  ligatures.  Over the remaining guide wire, a second tear-away sheath and  dilator were advanced.  The dilator was removed.  Again, the wire was  allowed to remain in place.  The atrial lead was advanced to the level of  the right atrium and the sheath was torn away.  A figure-of-eight hemostasis  suture was placed around the entry site of the leads into the pectoralis  muscle.  Again, using standard technique and fluoroscopic landmarks, the  lead was manipulated into the right atrial appendage.  There, excellent  pacing parameters were obtained as will be noted below.  The lead was tested  for diaphragmatic pacing at 10 volts and none was found.  This was an active  fixation lead and the screw was advanced appropriately.  The lead was then  sutured into place using three separate 0 silk ligatures.  The remaining  guide wire and the Kanamycin-soaked gauze was removed from the pocket and  the pocket was copiously irrigated using 1% Kanamycin solution.  The leads  were then attached to the pacing  generator carefully identifying each by its  serial number and placing each into the appropriate receptacle.  The leads  were then tightened into place and wound beneath the pacing generator.  The  generator was placed in the pocket.  The pocket was inspected for bleeding  and none was found.  The pocket was then closed with 2-0 Dexon in a running  fashion for the subcutaneous layer.  The skin was approximated using 5-0  Dexon in a running subcuticular fashion.  Steri-Strips and a sterile  dressing were applied and the patient was transported to the recovery area  in stable condition in an A pace, V sense mode.   EQUIPMENT DATA:  The pacing generator is a Medtronic Kappa KDR 901, serial  [id number].  The atrial lead is a Medtronic model U9629235, serial  [id number].  The ventricular lead is a Medtronic model M5895571, serial  [id number].   PACING DATA:  The ventricular lead detected and 11.5 millivolt R wave.  The  pacing threshold was 0.4 volts at 0.5 millisecond pulse width.  The  impedence was 842 ohms resulting in a current at capture threshold of 0.6  MA.  The atrial lead detected at 2.3 millivolt P wave.  The pacing threshold  was 0.9 volts at 0.5 millisecond pulse width.  The impedence was 553 ohms  resulting a current at capture threshold of 2.8 MA.                                               Francisca December, M.D.    JHE/MEDQ  D:  06/06/2003  T:  06/07/2003  Job:  [id number]   cc:   Molly Maduro L. Foy Guadalajara, M.D.  [address] [place]  [place] [address]  Fax: [phone]   Cardiac catheterization laboratory

## 2011-01-29 NOTE — Procedures (Signed)
EEG NUMBER:  [id number].   CLINICAL HISTORY:  This 77 year old lady being evaluated for TIA versus  stroke.   MEDICATION LISTED:  Betapace, Levothroid, Mavik, Dilantin, Coumadin,  Tylenol.   This is a 17-channel routine EEG recorded with the patient awake and  light sleep stages using standard 10/20 electrode placement.   Background awake rhythm consists of 8-9 Hz alpha which is a moderate  amplitude, synchronous, reactive to eye opening and closure.  No  paroxysmal epileptiform activity, spikes or sharp waves are seen.  Stages of light sleep only are achieved in this tracing and showed  normal physiological findings.  Length of this EEG is 23.7 minutes.  Technical component is average.  EKG tracing reveals regular sinus  rhythm.  Hyperventilation not performed.  Photic stimulation is  unremarkable.   IMPRESSION:  This EEG performed during awake and light sleep is within  normal limits.  No definite epileptiform activity is identified.           ______________________________  Sunny Schlein. Pearlean Brownie, MD     [id number]  D:  10/10/2006 18:03:45  T:  10/11/2006 01:35:53  Job #:  [id number]

## 2011-01-29 NOTE — Discharge Summary (Signed)
[name], [name]           ACCOUNT NO.:  [id number]   MEDICAL RECORD NO.:  [id number]          PATIENT TYPE:  INP   LOCATION:  3010                         FACILITY:  [facility]   PHYSICIAN:  Hollice Espy, M.D.DATE OF BIRTH:  09-24-1919   DATE OF ADMISSION:  10/09/2006  DATE OF DISCHARGE:  10/10/2006                               DISCHARGE SUMMARY   DISCHARGE DIAGNOSES:  1. Suspected transient ischemic attack, status post left sided      numbness, now resolved.  2. Anemia.  3. History of atrial fibrillation, status post pacemaker.  4. Hypertension.  5. Hypothyroidism.  6. History of spinal stenosis.   DISCHARGE MEDICATIONS:  Patient will resume all of her previous  medications, these are as follows:  1. Coumadin 3 mg p.o. daily.  2. Multivitamin p.o. daily.  3. Betapace 80 p.o. b.i.d.  4. Levoxyl 50 mcg p.o. daily.  5. Mavik 4 mg p.o. daily.  6. Cardizem 120 p.o. daily.   HOSPITAL COURSE:  The patient is an 77 year old white female with past  medical history of afib, status post pacemaker on Coumadin who presented  to the emergency room complaining of left sided numbness.  This soon  resolved, but there was a concern she may have had a TIA.  She was  admitted for evaluation.  Dr. Sandria Manly from Neurology saw the patient and  had recommended workup with echo carotid Doppler.  She initially was  amenable to testing and although the results of the Doppler and echo  were pending, the patient herself decided to leave against medical  advice on October 10, 2006, stating that she had a husband who was at  home whom she cares for who has Alzheimer's disease.  She said she had a  friend who was able to stay with him, but the friend cannot stay any  longer, the patient needs to get home.  I advised the patient that she  needs to stay in the hospital for completion of testing, taking of note  that if her INR is subtherapeutic on the day of discharge at 1.8.  She  is, however,  quite adamant.  After extensive discussion, I advised the  patient to please followup right away with Dr. Molly Maduro L. Foy Guadalajara this  coming week.  She tells me she will do so.  In the meantime she tells me  that she will do so and I advised her should she have any problems such  as chest pain, shortness of breath or any type of weakness, dizziness,  numbness, she should come back to the emergency room right away.  She  tells me she will do so.  I do have my significant concerns about the  patient leaving, as she has had brief episodes of what appears to be a  flutter and elevated heart rate, though patient is adamant about  leaving, despite the fact that we did not have any results for her lab  studies and her INR is subtherapeutic.  She has insisted and removed her  telemetry and is already dressed for leaving.  Patient will leave  against medical advice.  Her disposition since her initial presentation has improved.  Her  numbness has resolved.  Her discharge diet will be a heart healthy diet.  Her activity will be as she can tolerate and she will followup with Dr.  Foy Guadalajara ideally this week.      Hollice Espy, M.D.  Electronically Signed     SKK/MEDQ  D:  10/10/2006  T:  10/10/2006  Job:  [id number]   cc:   Molly Maduro L. Foy Guadalajara, M.D.

## 2011-01-29 NOTE — H&P (Signed)
[facility]  Patient:    [name], [name] Visit Number: [id number] MRN: [id number]          Service Type: EMS Location: [facility] Attending Physician:  [name] Dictated by:   [name], N.P. Admit Date:  06/06/2001   CC:         [name], M.D.  [name], M.D.  [name], M.D.   History and Physical  ER NOTE  PRIMARY CARE PHYSICIANS: 1. [name], M.D. 2. [name], M.D. 3. [name], M.D.  DATE OF BIRTH:  June 30, 1920  IMPRESSION:  (As dictated by Dr. Corliss Bryan).  1. Epigastric chest discomfort with associated nausea and slight shortness of     breath, initially improved with oxygen and morphine and then eventually     after GI cocktail in the emergency room.  She is status post a Cardiolite     August of last year which was negative for ischemia with ejection fraction     of 58%.  Her first set of heart enzymes are negative, and her     electrocardiograms with nonspecific ST-T wave abnormalities is essentially     unchanged from baseline.  2. Nausea exacerbated by morphine.  She has had intermittent nausea for the     last few weeks.  3. History of atrial flutter; currently maintaining normal sinus rhythm and     Tambocor.     A. (August 2002) successful direct current cardioversion setting of        Tambocor and Covera.     B. (December 2001) successful direct current cardioversion atrial flutter        and normal sinus rhythm setting in amiodarone.     C. (February 2001) direct current cardioversion from atrial flutter to        normal sinus rhythm setting in amiodarone.  A 2-D echocardiogram         August 2001, revealed left ventricle hypertrophy, moderate left atrial        enlargement.  4. Hypertension; blood pressure early morning was 220/94, currently 131/62.  5. Multiple drug sensitivities/allergies.  6. History of hypothyroidism which is supplemented.  7. Remote  history of atrial septal duct repair in 1965.  8. Systemic anticoagulation secondary to history of atrial fibrillation     followed by Dawn in our [facility].  9. Colonic diverticula by barium enema December 2000. 10. 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  She     has had ear shunts for fluid drainage in the past which helped. 11. History of hematuria thought secondary to kidney stone.  PLAN:  (As dictated by Dr. Corliss Bryan).  Will give IV Protonix.  If her second set of enzymes are negative, we will discharge to home with follow-up office visit.  HISTORY OF PRESENT ILLNESS:  [name] is a very pleasant 77 year old female with history of atrial fibrillation maintaining NSR on Tambocor therapy.  Chronic systemic anticoagulation secondary to this.  Also a history of hypertension, hypothyroidism, and Menieres disease.  She has been battling episodic nausea thought possibly related to Tambocor but has multiple drug allergies and sensitivities.  This early morning she awoke by epigastric pressure/nausea and slight shortness of breath.  No history of the same.  She does have a remote history of GERD, experiences burning with sour-tasting mouth unlike this presentation. EMS was summoned, and her blood pressure was found to  be elevated at 220/94. She noted to the EMS her intolerance to nitrates and aspirin, so they administered O2 with some improvement in her chest discomfort.  They gave increments of morphine of two 8 mg IV which improved chest discomfort but resulted in exacerbation of her nausea.  She was transported to the emergency room where a GI cocktail afforded the most relief, and she is currently discomfort-free.  She does continue with nausea.  PAST SURGICAL HISTORY: 1. Appendectomy. 2. Hysterectomy. 3. Tonsillectomy and adenoidectomy. 4. Shunts bilateral ears. 5. Hand operations x 3, ulnar nerve transplant.  ALLERGIES:  MULTIPLE -  AMIODARONE causing gait disturbance which has resolved off this drug.  PENICILLIN, CODEINE, ASPIRIN, CAFFEINE, LIBRAX, MOTRIN, TAGAMET, ERYTHROMYCIN, TALWIN, DIMETAPP, LODINE, DARVON, SELDANE, TOBACCO/SMOKE, PREDNISONE, GUAIFED, DILAUDID, NEOSPORIN, SULFA, ESTROGEN (secondary to ______ itis).  NITROGLYCERIN (sever migraines), seasonal allergies to grasses and trees.  MEDICATIONS: 1. Tambocor 100 mg tablet 1/2 in the morning and a whole tablet at night. 2. Covera-HS 180 mg p.o. q.h.s. 3. Hydrochlorothiazide 25 mg per day which is just today supposed to the    substituted with chlorthalidone 25 mg q.d. She is to take 10 mEq potassium    supplement with each chlorthalidone tablet. 4. Levoxyl 50 mcg 1/2 tablet alternating with 1 whole tab q.o.d. 5. Coumadin 5 mg 1 day and 2.5 mg otherwise.  SOCIAL HISTORY AND HABITS:  Tobacco:  Negative.  ETOH:  Negative.  The patient has been married for 54 years, has no children.  She has recently moved to a retirement community [name] in [place].  She is quite happy there.  Her sister lives across the street and helps drive patient about secondary to her Menieres disease and subsequent dizziness.  FAMILY HISTORY:  Father died at age 24 of an MI.  Husband died age 16 of a stroke.  One sister, again lives across the street.  REVIEW OF SYSTEMS:  An in HPI, surgical history and medical history. Otherwise does have episodic dysphagia to certain foods and pills.  She denies constipation or diarrhea.  Negative melena and no bright red blood PR. Negative dysuria or hematuria.  She does have a chronic right neck stiffness which is intermittent.  Lower extremity swelling, most prominent in the morning.  Sleeps with a few pillows on her right side.  PHYSICAL EXAMINATION:  (As performed by Dr. Corliss Bryan).   VITAL SIGNS:  Blood pressure was 220/94, currently 131/62.  Respiratory rate 20.  She is afebrile.  GENERAL:  She is a  well-nourished elderly female in no apparent distress. Alert and pleasantly conversant.  She does a little residual nausea.  HEENT:  Brisk bilateral carotid upstrokes without bruits.  NECK:  No JVD.  No thyromegaly.  CHEST:  Bilateral soft crackles.  No CVA tenderness.  CARDIAC:  Regular rate and rhythm without murmur, rub or gallop.  Normal S1 and S2.  ABDOMEN:  Soft, nondistended, normoactive bowel sounds.  Negative aortic, renal, or femoral bruit.  Nontender to applied pressure.  No masses. No organomegaly appreciated.  NEUROLOGIC:  Cranial nerve 2-12 are grossly intact.  Alert and oriented x 3.  GENITAL/RECTAL:  Deferred.  LABORATORY DATA:  Hemoglobin 12.6, hematocrit 36.4, WBC 4.5, platelets 621. CK 91, MB fraction 2.2, troponin I 0.03.  Sodium 132, potassium 3.8, chloride 94, CO2 26, BUN 18, creatinine 0.8, glucose 141.  EKG reveals sinus bradycardia at 54 beats per minute.  QTC okay at 419 milliseconds.  Right bundle-branch block pattern, borderline first-degree AV  block, nonspecific ST-T wave abnormality V1 through V6 and I and aVL which are about the same as compared to baseline.  Chest x-ray revealed vascular congestion without overt edema.  Linear atelectasis right upper lobe.  Right hilar prominence. Dictated by:   [name], N.P. Attending Physician:  [name] DD:  06/06/01 TD:  06/06/01 Job: [id number]

## 2011-01-29 NOTE — H&P (Signed)
[name], [name]           ACCOUNT NO.:  [id number]   MEDICAL RECORD NO.:  [id number]          PATIENT TYPE:  INP   LOCATION:  1823                         FACILITY:  [facility]   PHYSICIAN:  Deirdre Peer. Polite, M.D. DATE OF BIRTH:  1920/06/27   DATE OF ADMISSION:  10/09/2006  DATE OF DISCHARGE:                              HISTORY & PHYSICAL   CHIEF COMPLAINT:  Left-side numbness.   HISTORY OF PRESENT ILLNESS:  [age]-year-old female with multiple  medical problems, enters the emergency department at around 12 midnight  with left-side numbness.  The patient notes that just as I was asleep I got up move around to see  if symptoms would improve however the did not. The patient denied any  weakness. Patient called a nurse, however subsequently called 911. The  patient went to the ED for further evaluation. In the ED the patient's  evaluation revealed her to be afebrile with a temperature of 97.1, blood  pressure 159/84, pulse 91, respiratory rate of 20, saturation 94%. While  in the ED patient states she had resolution of her symptoms of numbness.  She denied any blurred vision, denied any dysarthria, denies any  drooling, denies any motor weakness. The patient denies any history of  CVA. Does have history of spinal stenosis which according to her  primarily affects the right side. In the ED patient had an EKG that  showed paced rhythm.  CT of head negative for any acute abnormality.  Chest x-ray showed chronic elevation of the right hemidiaphragm; pacer.  Electrolytes were within normal limits. Point of care enzymes within  normal limits. CBC: White count 3300, hemoglobin 9, hematocrit 27,  platelets 244,000. INR of 2.1. Admission is deemed necessary for  neurologic deficit characterized by left side numbness, to rule out  transient ischemic attack versus reversible progressive neurologic  deficit.   PAST MEDICAL HISTORY:  1. Significant for atrial fibrillation status post  pacemaker 3 years      ago.  2. Hypertension.  3. Hypothyroidism.  4. Spinal stenosis.  5. Meniere's disease.  6. Patient believes that she has a history of high cholesterol but is      not on any medications.   CURRENT MEDICATIONS:  1. Levoxyl 50 mg alternating whole and half-tablets daily.  2. Betapace 80 mg daily.  3. Mavik 4 mg daily.  4. Diltiazem 120 mg daily.  5. Coumadin 3 mg daily.  6. One vitamin.   SOCIAL HISTORY:  Negative for tobacco, alcohol or drugs.   PAST SURGICAL HISTORY:  Patient had an appendectomy at age 31.  Tonsillectomy in the past.   ALLERGIES:  PENICILLIN, CODEINE, PREDNISONE, DILAUDID, NITROGLYCERIN.  BEE STINGS (THERE ARE APPROXIMATELY 30 TO 40 ON THE LIST).   FAMILY HISTORY:  Noncontributory.   REVIEW OF SYSTEMS:  As stated in the HPI.   PHYSICAL EXAMINATION:  GENERAL: The patient is alert and oriented x3, in  no apparent distress.  VITALS: Blood pressure 144/74, temperature 97.1, pulse 89 with  respiratory rate of 21, saturating 98%.  HEENT: Pupils are equal, round and reactive to light. Moist oral mucosa.  NECK: Supple,  no adenopathy, without carotid bruit.  CHEST: Clear to auscultation bilaterally.  CARDIOVASCULAR: Regular, no S3.  ABDOMEN: Obese, nontender, no hepatosplenomegaly.  EXTREMITIES: The patient does have slight appearance of left leg being  larger than the right. According to patient this has been like this for  about a week or so, 2+ pulse.  NEURO EXAM: Cranial nerves II-XII intact. Motor 5 out of 5. Deep tendon  reflexes - difficult to elicit deep tendon reflexes on right lower  extremity, down going toes bilaterally. Gait not tested. Sensation  intact.  RECTAL EXAM: Deferred.   DATA:  As stated in the history of present illness.   ASSESSMENT:  1. Neurologic deficit characterized by left side numbness, now      resolved.  2. Atrial fibrillation status post pacemaker, on Coumadin with a      therapeutic INR of 2.1.  3.  Hypertension.  4. Anemia with an MCV of 81.9.  5. Hypothyroidism.  6. History of spinal stenosis.   RECOMMENDATIONS:  Recommend patient be admitted to a telemetry floor  bed. Patient will have further evaluation as to the cause for her  neurologic deficit. Due to patient's pacer cannot obtain an MRI. I will  obtain carotid Doppler studies, 2D echocardiogram, neurologic  evaluation. Please note, the patient has been seen by a neurologist, Dr.  Sandria Manly who also recommends transcranial Doppler's and CT scan of the  cervical spine to evaluate for possible cervical spinal stenosis leading  to her symptoms as well. The patient will have other labs to evaluate  reversible risk factors, i.e., homocysteine, hemoglobin A1C. Will make  further recommendations a deemed necessary.   Please note, because of patient's anemia will obtain iron studies and  check stools for heme. Will review old records to see how the patient's  baseline hemoglobin has been running.      Deirdre Peer. Polite, M.D.  Electronically Signed     RDP/MEDQ  D:  10/09/2006  T:  10/09/2006  Job:  [id number]

## 2011-01-29 NOTE — H&P (Signed)
NAME:  [name], [name]                     ACCOUNT NO.:  [id number]   MEDICAL RECORD NO.:  [id number]                   PATIENT TYPE:  INP   LOCATION:  2030                                 FACILITY:  [facility]   PHYSICIAN:  Francisca December, MD                 DATE OF BIRTH:  1919/10/20   DATE OF ADMISSION:  04/18/2002  DATE OF DISCHARGE:  04/20/2002                                HISTORY & PHYSICAL   IMPRESSION:  1. History of atrial fibrillation/flutter.     a. Status post cardioversion, April 14, 2002, setting of Tambocor; now        recurrent.     b. History of amiodarone-induced disequilibrium.     c. Patient with recurrent atrial fibrillation in setting of Rythmol        therapy in the past.  d  (August 2001) Episode of atrial flutter with associated substernal chest  pain:  Adenosine Cardiolite at that time without evidence of ischemia,  ejection fraction 58%.  She had gone spontaneously back into sinus rhythm  during that hospital admission, August 2001.  1. History of hypothyroidism; on supplement.  2. Systemic anticoagulation; INR slightly supratherapeutic at 3.29 on April 10, 2002.   PLAN:  1. Admit to telemetry for initiation of sotalol therapy 80 mg p.o. b.i.d.     Baseline QTc okay at 470 msec.  2. Daily pro time/INR.  3. Hold Covera if heart rate in the 70s.   HISTORY OF PRESENT ILLNESS:  The patient is a very pleasant 77 year old  female with history of atrial fibrillation recalcitrant to prior Rythmol and  amiodarone therapy and, most recently, recurrence of atrial fibrillation on  Tambocor, despite increasing of this medication.  She has multiple  complaints including sleepiness, fatigue and lack of energy, as well as  intermittent palpitations, especially when she is lying quietly.  She has  had some lightheadedness and a near-syncopal episode prompting a trip to the  walk-in clinic and seen by Dr. Molly Maduro L. Foy Guadalajara, who had suggested stopping  the Covera.   Upon evaluation by Dr. Francisca December with patient, and upon  discovery that her heart rate had risen on a few occasions to 136, she was  advised to restart her Covera at 180 mg per day; she had done this the  evening of April 09, 2002 with precipitation of a headache and feeling of  malaise.  She states she wishes to stay off the Covera.   Evaluation by Dr. Amil Amen in the office, April 10, 2002, included EKG  revealed A flutter with slow flutter wave at 140 beats per minute, 2:1 block  with ventricular rate of 77.   After long discussion with the patient regarding her options including  referral to electrophysiologist, a cardioversion and increasing dose of  Tambocor, a cardioversion and change to a different drug of choice for  atrial fibrillation or option  of rate control only with anticoagulation, the  patient was just inclined to accept the latter.  Ultimately, the patient was  agreeable to admission to the hospital with discontinuation of Tambocor and  initiation of sotalol with possible subsequent cardioversion.   PREVIOUS MEDICAL HISTORY:  As above; also:  1. History of superficial thrombophlebitis and history of remote DVT after     surgery.  2. History of Meniere's disease with subsequent shunts, both ears.   PREVIOUS SURGICAL HISTORY:  Appendectomy, hysterectomy, tonsillectomy, total  hysterectomy, ASD repair, shunts in ears for treatment of Meniere's disease,  hand operation x2, ulnar nerve transplant.   ALLERGIES:  PENICILLIN causing rash; CODEINE causing sleepiness and nausea;  intolerance in the past to ASPIRIN;  intolerance to DEMEROL, LIBRAX,  TAGAMET, MOTRIN, DARVOCET, DIMETAPP, ADHESIVE TAPE and MYCINS.   MEDICATIONS:  1. Coumadin 2.5 mg p.o. q.d. except for 5 mg on Saturday and Sunday.  2. Covera-HS 180 mg q.h.s.  3. Mavik 1 mg per day.  4. Levoxyl 15 mcg per day.  5. Tambocor -- which was discontinued April 17, 2002.  6. Hydrochlorothiazide 25 mg per day.   7. Potassium gluconate 500 mg per day.   SOCIAL HISTORY AND HABITS:  The patient is a resident at [facility]  in [place].  Tobacco/ETOH:  Negative.  The patient has no children.  She  is a retired Runner, broadcasting/film/video.   FAMILY HISTORY:  Father died at the age 1 of a heart attack.  Mother died  at age 17 of a stroke.   REVIEW OF SYSTEMS:  Complains of dyspnea with palpitations.  Denies melena  nor bright red blood PR.  Denies muscular aches or pains.  Has bilateral  cataracts, wears glasses.  She complains of sleeping difficulties and takes  Ambien p.r.n.   PHYSICAL EXAMINATION:  VITAL SIGNS:  Blood pressure is 120/72, heart rate 68  and irregular, respiratory rate 20, temperature 97.7.  She is 161.2 pounds  and her height is 5 feet 5 inches.  GENERAL:  In general, she appears well.  HEENT/NECK:  Brisk bilateral carotid upstrokes without bruit.  No  significant JVD.  CHEST:  Lung sounds clear with equal bilateral excursion.  CARDIAC:  Irregular rate and rhythm, normal S1 and S2.  No murmur, click nor  rub.  ABDOMEN:  Soft, nondistended, normoactive bowel sounds.  Negative  organomegaly.  LOWER EXTREMITIES:  Negative pedal edema.  Distal pulses intact.   LABORATORY AND ACCESSORY CLINICAL DATA:  EKG revealed atrial flutter with  slow flutter waves at 140 beats per minute.  She has 2:1 block with  ventricular rate 77.   INR of 2.29, office evaluation, April 10, 2002.  Labs from April 17, 2002  revealed sodium of 132, potassium of 4.3, chloride of 94, CO2 of 30, BUN of  12, creatinine 1.1, glucose 92.  Calcium 8.9.  Hemoglobin 12.2, hematocrit  of 35.2, WBC 3.2, platelets of 215,000.  Pro time of 21.1, INR of 3.29, PTT  of 37.     Salomon Fick, NP                         Francisca December, MD    MES/MEDQ  D:  05/31/2002  T:  06/02/2002  Job:  [id number]   cc:   Molly Maduro L. Foy Gu[place], M.D.  [address] [place]  [place] [address]  Fax: [phone]

## 2011-01-29 NOTE — Procedures (Signed)
[place]. [facility]  Patient:    [name], [name]                  MRN: [id number] Proc. Date: 10/23/99 Adm. Date:  [id number] Disc. Date: [id number] Attending:  Corliss Marcus CC:         Lum Babe, M.D.                           Procedure Report  PROCEDURE:  Elective cardioversion.  INDICATIONS:  Recurrent atrial flutter.  ANESTHESIA:  Provided by Dr. Sheldon Silvan, 175 mg of sodium pentothal.  MEDICINE ATTENDING:  Francisca December, M.D.  FINDINGS:  There was prompt return of sinus bradycardia rate of 55 beats per minute following a single transthoracic dose of energy at 200 watt seconds in a synchronized fashion.  COMPLICATIONS:  None.  IMPRESSION:  Successful elective cardioversion atrial flutter to sinus rhythm.  PLAN:  Will recommend increase amiodarone to 400 mg p.o. q.day x two weeks, then one p.o. q.day.  Patient previously had been on 1.5 (100 mg) daily.  Will have er follow up with Dr. Demetrius Revel in seven to 14 days. DD:  10/23/99 TD:  10/24/99 Job: [id number]

## 2011-01-29 NOTE — H&P (Signed)
[place]. [facility]  Patient:    [name], [name]                  MRN: [id number] Adm. Date:  [id number] Attending:  Lum Babe                         History and Physical  CHIEF COMPLAINT: The patient is a 77 year old white female admitted to the hospital with an episode of atrial flutter accompanied by substernal chest pain.  HISTORY OF PRESENT ILLNESS: The chest discomfort is new.  She has had four cardioversions for atrial flutter over the last couple of years.  Current drug regimen is amiodarone 200 mg q.d., hydrochlorothiazide 25 mg q.d., and Levoxyl, probably 50 mcg q.d.  She has had an ASD repaired in the past. Previous admission for a different chest discomfort, followed by a negative adenosine Cardiolite study.  Last night about midnight she developed an increased heart rate and blood pressure went up as high as 200.  Heart rate went as high as approximately 130 and was irregular.  She developed substernal chest discomfort which waxed and waned but really stayed until just a few minutes before being seen by me in the emergency room at 0800.  The patient was substernal and nonradiating but was accompanied by some nausea and diaphoresis.  She just had a 2D echocardiogram done last week.  In the past she had taken flecainide and she had been on Coumadin and had hematuria, probably related to a kidney stone.  She has been bothered by dizziness, which she feels may be secondary to amiodarone therapy.  She has been having difficulty walking.  Office work-up was negative including MRI.  Details of this are not available at the time of this dictation.  FAMILY HISTORY: Father died at age 27 with heart attack.  Mother died at age 85 with stroke.  One sister living.  SOCIAL HISTORY: Weight not recorded but has been stable.  She does not smoke or drink.  No coffee.  Decaffeinated tea, drinks ginger tea which helps with dizziness.  Inadequate  sleep, never good.  Husband is living and has pernicious anemia and memory problems.  No children.  She is a retired Runner, broadcasting/film/video.  MEDICATIONS:  1. Amiodarone.  2. Hydrochlorothiazide.  3. Levoxyl.  REVIEW OF SYSTEMS: Negative except for recent.  INFECTIOUS DISEASE: None.  CHILDHOOD DISEASE: Usual.  PAST SURGICAL HISTORY:  1. Appendectomy.  2. Hysterectomy.  3. Tonsillectomy and adenoidectomy.  4. Prior to hysterectomy had myomectomy.  5. ASD repair.  6. Shunts in ears.  7. Hand operation x 3.  8. Ulnar nerve transplant.  INJURIES: None.  ALLERGIES:  1. PENICILLIN causes rash.  2. CODEINE causes sleepiness and nausea.  3. Intolerance of ASPIRIN.  4. Intolerance of DEMEROL.  5. Intolerance of LIBRAX.  6. Intolerance of TAGAMET.  7. Intolerance of MOTRIN.  8. Intolerance of DARVOCET.  9. Intolerance of DIMETAPP. 10. Intolerance of ADHESIVE TAPE. 11. Intolerance of MYCINS.  REVIEW OF SYSTEMS: GENERAL: Negative for headaches, sinus headaches, migraines done.  HEENT: Spectacles.  Cataract of right eye.  Menieres disease.  NECK: Negative.  RESPIRATORY: Pleurisy x 1.  CARDIAC: See above.  EXTREMITIES: Ankle and feet swelling the last six months.  GI: Some heartburn.  GU: Kidney stones in the past.  BONES/JOINTS: Hand problems.  CNS: Ulnar nerve transplant. MISCELLANEOUS: Back trouble.  PHYSICAL EXAMINATION:  GENERAL: Elderly, well-developed female in no distress  at this time.  VITAL SIGNS: Pulse 68.  Cannot tell from the monitor if she is back in sinus rhythm or not but this is lower than the earlier 96 recorded on cardiogram. Blood pressure recorded earlier at 153/97.  SKIN: Warm and dry.  NECK: Supple.  Thyroid not enlarged.  No bruits.  HEENT: PERRL.  Optic fundi show some increase in arteriolar light reflex. Ears, nose, and throat negative.  BREAST: Negative (recent discomfort but negative mammogram).  HEART: S1 and S2 normal, S2 is single, probably an S4  which would indicate sinus mechanism, no S3.  ABDOMEN: Liver, kidneys, and spleen not felt.  No masses.  EXTREMITIES: Pulses intact without clubbing, cyanosis, or edema.  Some ecchymosis noted on right thigh anteriorly.  There is a -.  NEUROLOGIC: Grossly normal.  GU: Examination not done.  RECTAL: Brown stool, guaiac negative.  LABORATORY DATA: Early laboratory studies reveal normal hemoglobin. Electrolytes, BUN, and creatinine normal.  CK 154, MB 2.7.  Troponin I 0.06. PT and PTT normal.  Cardiogram showed atrial flutter.  IMPRESSION:  1. Atrial flutter.  2. Chest pain, rule out myocardial infarction.  DISPOSITION: Admit to hospital.  Will get serial enzymes.  Repeat cardiogram and see if she has gone into sinus rhythm.  Give IV heparin until we know for sure.  May wish to get inpatient adenosine Cardiolite if possible.  She is still complaining of light headedness and I am asking neurology to see the patient as well. DD:  04/28/00 TD:  04/28/00 Job: [id number]

## 2011-01-29 NOTE — Procedures (Signed)
[place]. [facility]  Patient:    [name], [name]                  MRN: [id number] Adm. Date:  [id number] Attending:  Corliss Marcus                           Procedure Report  PROCEDURE:  Electrical cardioversion.  INDICATION FOR PROCEDURE:  Recurrent atrial fibrillation.  DESCRIPTION OF PROCEDURE:  After obtaining written informed consent, anesthesia was provided by Kaylyn Layer. Michelle Piper, M.D.  Following appropriate sedation, the patient was cardioverted with 200 joules.  She initially converted to a first degree AV block and later converted to a sinus rhythm with incomplete right bundle branch block.  RECOMMENDATION:  Continue with Tambocor 150 mg p.o. t.i.d. and anticoagulation for three weeks. DD:  09/09/00 TD:  09/09/00 Job: [id number]

## 2011-01-29 NOTE — Consult Note (Signed)
[name], [name]           ACCOUNT NO.:  [id number]   MEDICAL RECORD NO.:  [id number]          PATIENT TYPE:  INP   LOCATION:  3010                         FACILITY:  [facility]   PHYSICIAN:  Genene Churn. Love, M.D.    DATE OF BIRTH:  04-Nov-1919   DATE OF CONSULTATION:  DATE OF DISCHARGE:                                 CONSULTATION   HISTORY:  This was one of several [facility] admissions for  this 77 year old right-handed white married female who lives in  independent living and takes care of her demented husband.  They are  from [place], [place].  She has been taking care of him with  Alzheimer's disease, and she is independent of all activities including  driving a car.  She does have a known past history of atherosclerotic  vascular disease status post repair by Dr. Arnette Felts in approximately  1965, chronic atrial fibrillation status post 7 DC cardioversion  attempts without success and now with pacemaker since February 2004,  chronic Coumadin therapy, hypertension, status post lumpectomy for  breast cancer in October 2007, lumbar spinal stenosis with right leg  pain, history of TIAs with visual disturbance prior to repair of ASD  versus migraine, history of migraine, multiple drug allergies, status  post bilateral cataract surgery, and Meniere's disease.  She has  multiple drug allergies as mentioned.   CURRENT MEDICATIONS:  1. Betapace 80 mg daily.  2. Mavik 4 mg daily.  3. Diltiazem HCl 120 mg daily.  4. Levoxyl 50 mcg daily.  5. Coumadin 3 mg daily.  6. Multivitamins daily.   She is seen in the emergency room and admitted by [facility] hospitalist for  evaluation of left-sided numbness.   Over the last few months, this patient has had episodes about once per  week of left arm numbness involving the entire arm.  The length of time  this would last is unknown.  This was not associated headache.  She has  also had history of right leg numbness which she  relates to spinal  stenosis.  She was in her usual state of health lying in bed about  midnight October 08, 2006 to October 09, 2006, when she noticed that her  left foot and leg were numb and that they had a swollen sensation.  She  also noticed that her left arm was numb.  She had a sensation in her  esophagus like a lump in the esophagus which she has had the past.  She also noted pain occurring in the right side of her neck.  She call  the nurse who recommended that she be evaluated, and then she  subsequently called 9-1-1, because she thought the numbness was getting  worse.  She did notice frequent urination.  There was no associated  single eye visual loss, double vision, swallowing problems or slurred  speech.  She does have a history of pain occurring in her right hip,  going to her knee.  She has had bilateral knee pain.  She had a history  of phlebitis in her left leg in the past.   ALLERGIES:  Her medications that she is allergic to include PENICILLIN,  ASPIRIN, CAFFEINE, MOTRIN, TAGAMET, ERYTHROMYCIN, TALWIN, DIMETAPP,  PREDNISONE, ATIVAN, DILAUDID, DARVON, SULFA COMPOUNDS, ESTROGEN  COMPOUNDS, AMIODARONE, NITROGLYCERIN, VITAMIN E, LIDOCAINE,  AZITHROMYCIN, MICARDIS, RYTHMOL, PLAVIX.  She says she is also allergic  to BEE POLLEN and BEE STINGS.   EXAMINATION:  GENERAL:  Revealed severe examination well-developed white  female.  VITAL SIGNS:  Blood pressure right arm 160/80.  Heart rate was 60.  HEART:  She had paced beats.  There were no bruits.  MENTAL STATUS:  She was alert and oriented x3.  There is no aphasia or  denial.  NEUROLOGIC:  Cranial nerve examination revealed her visual fields to be  full.  She was status post cataract surgery bilaterally.  Both disks  were seen and flat.  There was no seventh nerve palsy.  Tongue was  midline.  The uvula was midline.  Gags were present.  Sternocleidomastoid and trapezius testing were normal.  Her motor  examination  revealed good strength in the upper and in the lower  extremities.  She had a diffuse outstretched hand and arm tremor.  Sensory examination was intact to pinprick, touch, position, and  vibration.  The deep tendon reflexes revealed 1+ reflexes with downgoing  plantar responses.  Gait was not evaluated.   CT scan of the brain showed mild diffuse atrophy including cortical  atrophy and enlargement of the ventricles with small vessel disease.   LABORATORY:  Sodium is 137, potassium 4.5, chloride 104, BUN 12, glucose  103, pH 7.416, pCO2 41, bicarb 26.3, hematocrit was 29 with a hemoglobin  of 9.9.  Her white blood cell count was 3,300, repeat hemoglobin was 9  and hematocrit 27.6, platelet count is 244k.  Her prothrombin time was  24.5.  INR was 2.1.  EKG showed paced beats with probable underlying  atrial fibrillation.  Creatinine was 0.9.   IMPRESSION:  1. Right brain transient ischemic attack 425.0 versus cervical spinal      stenosis 724.02.  2. History of atrial septal defect, status post repair code 747.9.  3. Atrial fibrillation with pacemaker 427.31.  4. History of hypertension at 796.2.  5. History of right breast cancer, code 174.9.  6. History of left leg phlebitis code 451.19.  7. History of previous transient ischemic attacks, code 435.9.  8. Multiple drug allergies.  9. Anemia.  The patient is admitted at this time for further      evaluation.           ______________________________  Genene Churn. Sandria Manly, M.D.     JML/MEDQ  D:  10/09/2006  T:  10/09/2006  Job:  [id number]   cc:   Francisca December, M.D.  Dewaine Oats, MD

## 2011-01-29 NOTE — Discharge Summary (Signed)
NAME:  [name], [name]                     ACCOUNT NO.:  [id number]   MEDICAL RECORD NO.:  [id number]                   PATIENT TYPE:  INP   LOCATION:  3708                                 FACILITY:  [facility]   PHYSICIAN:  Deirdre Peer. Polite, M.D.              DATE OF BIRTH:  [birth year]   DATE OF ADMISSION:  06/08/2002  DATE OF DISCHARGE:  06/11/2002                                 DISCHARGE SUMMARY   DISCHARGE DIAGNOSES:  1. Headache and dizziness, resolved, secondary to hypertension.  CT of the     head negative.  2. Hypertensive urgency.  Blood pressure on admission 204/116,resolved at     discharge at 140/74 on Betapace 80 mg in a.m., 40 mg p.m.  Mavik 2 mg     p.o. q.d.  The patient was asked to hold hydrochlorothiazide secondary to     hyponatremia.  3. Hyponatremia, on admission 124, discharge 130.  The patient's sodium     returned to normal with normal saline.  Thyroid-stimulating hormone     within normal limits at 1.4, cortisol level 14 presumed secondary to     diuretic.  Recommend followup this week.  4. Hypothyroidism.  5. History of atrial fibrillation on Coumadin therapy with admission INR     2.2.  The patient was admitted in normal sinus rhythm, discharged in     normal sinus rhythm with sinus bradycardia with rate of 46 to 50.     Patient asymptomatic, cleared by cardiology.  6. History of Meniere's'.  7. History of migraines.  8. History of septal defect treated in 1963.  9. History of deep vein thrombosis in the distant past.   ALLERGIES:  Multiple drug allergies.   DISCHARGE MEDICATIONS:  1. Betapace 80 mg in a.m., 40 mg p.m.  2. Mavik 2 mg 1 daily.  3. Coumadin 2.5 mg daily to be adjusted by primary MD.  4. Synthroid 50 mcg 1 daily.   CONCLUSION:  Francisca December, M.D., [facility].   LABORATORY DATA:  The patient had a CT of the head, no bleed, shift, or  mass.   CBC within normal limits.  INR 2.2 on admission.  BMET within normal limits  except for hyponatremia of 124 on admission.  TSH 1.4.  Calcium 8.8.  Cortisol level 14.   EKG significant for sinus bradycardia, prolonged QT on admission, discharge  QT 450.  On admission QTC 508, discharge QTC 450.   Chest x-ray showed cardiomegaly with chronic changes, no infiltrate.   As stated, CT of the head showed no bleed, shift, or mass.  Only significant  for cerebral atrophy.   HISTORY OF PRESENT ILLNESS:  White female with above medical problems.  Had  a one-day history of dizziness associated with nausea and bilateral temporal  headache which was nonpulsatile in nature.  The patient was seen by home  health nurse, and it was recommended to follow up in  the ED because of  elevated blood pressure.   In the ED, the patient was seen and evaluated by cross coverage for [place],  Dr. Everardo All.  This revealed markedly elevated blood pressure.  CT negative  for acute abnormalities.  Neurological exam was nonfocal.  The patient was  started on outpatient blood pressure medications with rapid improvement in  blood pressure and symptoms of dizziness.  Screening labs were ordered, and  admission was deemed necessary for further evaluation and treatment.   PAST MEDICAL HISTORY:  Please see Problem List.   Please see dictated H&P for further details on History of Present Illness.   HOSPITAL COURSE:  1. HEADACHE AND DIZZINESS:  Improved by the time of my evaluation six hours     post admission.  It was presumed the patient's symptoms were primarily     secondary to uncontrolled hypertension.  CT of brain was negative for any     bleed, shift, or mass.  She denied any sinus problems.  Her neurological     exam was nonfocal.  There were no cerebellar signs.  Plan was to follow     up clinically and check MRI if any change.  Throughout this     hospitalization, there was no recurrent episodes of headache or     dizziness; therefore, MRI was not ordered.  Of note, the patient does      have a history of Meniere's' disease with prior surgery for that.  It was     less likely that was cause as patient's symptoms improved with treatment     of blood pressure.   1. LABILE HYPERTENSION:  No problem during this hospitalization.  Discharge     medications include Betapace 80 mg a.m., 40 mg p.m.; Mavik 2 mg 1 daily.     The patient was asked to hold hydrochlorothiazide secondary to     hyponatremia.   1. HYPONATREMIA:  This was presumed secondary to diuretic.  The patient was     placed on fluid restriction with 1 liter and IV FLUIDS.  The patient had     dramatic improvement in sodium to one-third.  No neurologic sequelae as     result of patient's hyponatremia.  At this time, presumed diuretic.  Will     have her BMET repeated by her primary MD this week and further treatment     as needed.   1. PAROXYSMAL ATRIAL FIBRILLATION:  Admitted in normal sinus rhythm with     therapeutic INR.  The patient remained in normal sinus rhythm to sinus     bradycardia during this admission.  At time, extreme bouts of bradycardia     in 40s, 60s, 50s.  The patient was asymptomatic with no dizziness, no     orthostasis, able to ambulate the hall.  There was some concern for the     patient's Betapace; therefore, cardiology consult was obtained.  The     patient was seen in consult by Dr. Amil Amen and was noted to have     significant bradycardia in the past.  As patient was asymptomatic,     continuing Betapace at the current dose as QTC was initially prolonged.     At the time of discharge QTC within normal limits at 450.   1. HYPOTHYROIDISM:  As stated, TSH was within normal limits.  The patient     would continue current outpatient dosage of medications.   The patient had several other chronic  medical problems which were stable  throughout this hospitalization.  The patient was medically stable for discharge, will be asked to follow up with primary MD this week.  Will  follow up labs  as stated.                                               Deirdre Peer. Polite, M.D.    RDP/MEDQ  D:  06/11/2002  T:  06/14/2002  Job:  [id number]   cc:   Molly Maduro L. Foy [place], M.D.  [address] [place]  [place] [address]  Fax: [phone]

## 2011-01-29 NOTE — Discharge Summary (Signed)
[place]  Patient:    [name], [name]                  MRN: [id number] Adm. Date:  [id number] Disc. Date: [id number] Attending:  Lum Babe                           Discharge Summary  HISTORY OF PRESENT ILLNESS:  A 77 year old white female admitted to the hospital with atrial flutter accompanied by substernal chest pain.  The patient presented with tachycardia and hypotension but by the time she was admitted to the hospital had heart exam showing atrial flutter with a controlled ventricular rate.  She reverted to sinus rhythm spontaneously.  She underwent adenosine-Cardiolite which showed no evidence of ischemia, ejection fraction of 58%, and was discharged home on 04/29/00.  LABORATORY DATA:  Hemoglobin 13.2 gm %, hematocrit 37.1%, white count 5300, 68 polys, 20 lymphs, 9 monocytes, 1 eosinophil, no basophils, two large unidentified cells.  Follow-up hemoglobin and hematocrit were basically unchanged.  PT and PTT were normal on admission.  PTT was prolonged with the use of heparin.  Glucose was 119 and 122 on nonfasting specimens.  Sodium 135, repeat 131, potassium 3.7, repeat 3.4, chloride 101, CO2 28.  Other chemistries were normal.  Troponin I was 0.06 and less than 0.03.  CK enzymes were all negative.  Lipids: Cholesterol 241, triglyceride 218, HDL 46, LDL 151.  Electrocardiograms are not in the hospital chart at the time of this dictation.  HOSPITAL COURSE:  Before the patient left the emergency room, it was felt she had gone into sinus rhythm although because of the chest discomfort she was admitted to the hospital.  She remained in sinus rhythm throughout.  There is no evidence of infarction and she underwent an adenosine nuclear study, with the findings as noted of no ischemia, ejection fraction of 58%.  There may have been some septal hypokinesia.  DISCHARGE DIAGNOSES: 1. Atrial fibrillation. 2. Chest pain. 3. No  infarction. 4. Status post repair of atrial septal defect.  DISCHARGE MEDICATIONS: 1. Levoxyl 50 mcg one a day. 2. Hydrochlorothiazide 25 mg one a day. 3. Amiodarone 200 mg one-half or 100 mg daily.  ACTIVITY:  No restrictions.  DIET:  No restrictions.  WOUND CARE:  Not applicable.  SPECIAL INSTRUCTIONS:  None.  FOLLOW-UP:  Return visit to physician in two weeks.  CONDITION ON DISCHARGE:  Improved. DD:  05/06/00 TD:  05/07/00 Job: [id number]

## 2011-01-29 NOTE — Op Note (Signed)
[place]  Patient:    [name], [name]                  MRN: [id number] Proc. Date: 04/25/01 Adm. Date:  [id number] Attending:  Corliss Marcus                           Operative Report  PROCEDURE:  Elective cardioversion.  INDICATION:  The patient is a 77 year old woman with recurrent atrial fibrillation flutter.  It had been controlled on Tambocor 50 mg twice daily. She has undergone previous multiple elective cardioversions.  She had an increase in her Tambocor from 50 b.i.d. to 100 b.i.d. which resulted in the conversion of her fibrillation to slow atrial rate of approximately 200.  She is brought now for elective cardioversion.  She is chronically systemically anticoagulated with a therapeutic INR.  ANESTHESIA:  This was provided by Guadalupe Maple, M.D., 125 mg of IV Pentothal.  FINDINGS:  While monitoring heart rate, blood pressure, O2 saturation and ECG the patient was administered 125 mg of IV pentothal with anesthesia in attendance.  Upon attainment of adequate level of anesthesia a 50 joule biphasic synchronized dose of DC energy was applied to the chest in a posterior anterior fashion.  This resulted initially in a junctional rhythm of approximately 40, subsequent sinus bradycardia developed rate of 46.  The patient is awakening now without any sequelae.  IMPRESSION:  successful elective cardioversion to sinus bradycardia.  PLAN:  Tambocor should be given 100 mg p.o. b.i.d.  It had been decreased to 100 in the morning and 50 in the evening because of excessive lightheadedness.  Will decrease her Covera-HS from 240 to 180 mg p.o. q.d and plan on an office visit in 2 weeks. DD:  04/25/01 TD:  04/25/01 Job: [id number]

## 2011-02-01 ENCOUNTER — Encounter (INDEPENDENT_AMBULATORY_CARE_PROVIDER_SITE_OTHER): Payer: Self-pay | Admitting: Surgery

## 2011-03-15 ENCOUNTER — Encounter: Payer: BC Managed Care – PPO | Admitting: Vascular Surgery

## 2011-03-18 ENCOUNTER — Ambulatory Visit (INDEPENDENT_AMBULATORY_CARE_PROVIDER_SITE_OTHER): Payer: Medicare Other | Admitting: *Deleted

## 2011-03-18 DIAGNOSIS — I495 Sick sinus syndrome: Secondary | ICD-10-CM

## 2011-03-18 LAB — PACEMAKER DEVICE OBSERVATION
AL AMPLITUDE: 1.4 mv
AL IMPEDENCE PM: 447 Ohm
BATTERY VOLTAGE: 2.65 V
BRDY-0002RV: 60 {beats}/min
RV LEAD AMPLITUDE: 22.4 mv
RV LEAD IMPEDENCE PM: 853 Ohm
VENTRICULAR PACING PM: 59

## 2011-03-18 NOTE — Progress Notes (Signed)
Pacer check

## 2011-03-30 ENCOUNTER — Ambulatory Visit (INDEPENDENT_AMBULATORY_CARE_PROVIDER_SITE_OTHER): Payer: BC Managed Care – PPO | Admitting: Surgery

## 2011-04-05 ENCOUNTER — Encounter: Payer: Self-pay | Admitting: Vascular Surgery

## 2011-04-21 ENCOUNTER — Encounter: Payer: BC Managed Care – PPO | Admitting: Vascular Surgery

## 2011-04-27 ENCOUNTER — Ambulatory Visit (INDEPENDENT_AMBULATORY_CARE_PROVIDER_SITE_OTHER): Payer: Medicare Other | Admitting: Surgery

## 2011-04-27 ENCOUNTER — Encounter (INDEPENDENT_AMBULATORY_CARE_PROVIDER_SITE_OTHER): Payer: Self-pay | Admitting: Surgery

## 2011-04-27 DIAGNOSIS — Z853 Personal history of malignant neoplasm of breast: Secondary | ICD-10-CM

## 2011-04-27 NOTE — Progress Notes (Signed)
Subjective:     Patient ID: [name], female   DOB: [birth year], 77 y.o.   MRN: [id number]  HPI The patient returns to clinic today. She is an 77 year old female who is now 5 years out from a right breast lumpectomy with sentinel lymph node mapping for stage I right breast cancer. She is being followed for this with no therapy. I saw her last year for gallstones. She was minimally symptomatic and given her medical problems was followed and placed on Actigall. She has no further issue with the Storz. She does complain of some rest pain on the left. She denies any new breast mass, discharge or change in either breast otherwise.   Review of Systems  Constitutional: Negative.   HENT: Negative.   Eyes: Negative.   Respiratory: Negative.   Cardiovascular: Negative.   Gastrointestinal: Negative.   Genitourinary: Negative.   Musculoskeletal: Negative.   Psychiatric/Behavioral: Negative.        Objective:   Physical Exam  Constitutional: She appears well-developed and well-nourished.  HENT:  Head: Normocephalic and atraumatic.  Nose: Nose normal.  Eyes: Conjunctivae and EOM are normal. Pupils are equal, round, and reactive to light.  Neck: Normal range of motion. Neck supple.  Cardiovascular: Normal rate and normal heart sounds.   Pulmonary/Chest: Effort normal.       Both breasts were examined today. Left breast shows no mass lesion nipple discharge or evidence of left axillary adenopathy.  Right breast shows postsurgical changes. No masses palpable. No nipple discharge noted. Right axilla is normal.  Skin: Skin is warm and dry.       Assessment:     Stage I right breast cancer date of surgery 2007  History of gallstones asymptomatic    Plan:     She is 5 years out from her breast cancer diagnosis. Her mammogram is reviewed from May of this year and was normal. I do not feel she needs any further followup of this problem. She will continue to see her primary care doctor  if she has been. Her gallstones still are asymptomatic therefore we'll continue to follow her. She is on Actigall and seems to be doing well. I will see her back he

## 2011-04-27 NOTE — Patient Instructions (Signed)
No further follow up needed.  Continue  Follow up with primary care doctor. You are a 5 year survivor.  Congratulations!

## 2011-06-18 LAB — PROTIME-INR
INR: 2.6 — ABNORMAL HIGH
INR: 2.8 — ABNORMAL HIGH
Prothrombin Time: 28.5 — ABNORMAL HIGH

## 2011-06-18 LAB — CBC
MCV: 89.2
Platelets: 243
WBC: 3.4 — ABNORMAL LOW

## 2011-06-18 LAB — CARDIAC PANEL(CRET KIN+CKTOT+MB+TROPI)
CK, MB: 3.1
Relative Index: INVALID
Total CK: 111
Total CK: 88
Troponin I: 0.02

## 2011-06-18 LAB — DIFFERENTIAL
Basophils Absolute: 0
Basophils Relative: 1
Neutro Abs: 1.8
Neutrophils Relative %: 52

## 2011-06-18 LAB — POCT CARDIAC MARKERS
CKMB, poc: 1.7
CKMB, poc: 3.3
Myoglobin, poc: 107
Myoglobin, poc: 115

## 2011-06-18 LAB — I-STAT 8, (EC8 V) (CONVERTED LAB)
Acid-Base Excess: 3 — ABNORMAL HIGH
Bicarbonate: 28.3 — ABNORMAL HIGH
HCT: 41
Operator id: 261381
pCO2, Ven: 46.8

## 2011-06-18 LAB — CK TOTAL AND CKMB (NOT AT ARMC)
CK, MB: 3.8
Relative Index: 3.7 — ABNORMAL HIGH

## 2011-06-18 LAB — POCT I-STAT CREATININE
Creatinine, Ser: 1.1
Operator id: 261381

## 2011-06-22 ENCOUNTER — Encounter: Payer: Self-pay | Admitting: Vascular Surgery

## 2011-06-24 ENCOUNTER — Encounter: Payer: Self-pay | Admitting: Vascular Surgery

## 2011-06-25 ENCOUNTER — Encounter: Payer: Self-pay | Admitting: Vascular Surgery

## 2011-06-25 ENCOUNTER — Ambulatory Visit (INDEPENDENT_AMBULATORY_CARE_PROVIDER_SITE_OTHER): Payer: Medicare Other | Admitting: *Deleted

## 2011-06-25 ENCOUNTER — Ambulatory Visit (INDEPENDENT_AMBULATORY_CARE_PROVIDER_SITE_OTHER): Payer: Medicare Other | Admitting: Vascular Surgery

## 2011-06-25 VITALS — BP 150/88 | HR 101 | Resp 20 | Ht 65.0 in | Wt 160.0 lb

## 2011-06-25 DIAGNOSIS — I83893 Varicose veins of bilateral lower extremities with other complications: Secondary | ICD-10-CM | POA: Insufficient documentation

## 2011-06-25 DIAGNOSIS — I872 Venous insufficiency (chronic) (peripheral): Secondary | ICD-10-CM

## 2011-06-25 NOTE — Progress Notes (Signed)
[facility] [place]  Referred by:   [facility] [address] [facility] [place], [place] [address]   Reason for referral: Painful swollen R leg  History of Present Illness  [name] is a 77 y.o. female who presents with chief complaint: painful swollen R leg.  She complains of vague pain in he ant. Leg at the site of her varicose veins in her R lower leg.  Patient notes, onset of swelling decades ago.  She has been wearing thigh high compression stockings for > 20 years.  The patient has had  history of LLE DVT, history of varicose vein, no history of venous stasis ulcers, no history of  Lymphedema and  history of skin changes in lower legs.  There is some family history of venous disorders.  The patient has  used compression stockings in the past.  Past Medical History  Diagnosis Date  . Arthritis   . Chronic atrial fibrillation on chronic coumadin anticoagulation  . Hypertension   . Thyroid disease hypothyroidism  . Esophageal reflux with paraesophageal hernia  . Diverticulosis   . Hx of breast cancer s/p lumpectomy   . Spinal stenosis with chronic LBP  . Anemia   . Vitamin D deficiency   . Depression   . Osteoporosis   . UTI (lower urinary tract infection)   . Proteus mirabilis infection   . Tachy-brady syndrome   . Cancer     history of breast cancer with a lumpectomy  . Chest pain   . Varicose veins   . Fatigue   . History of depression   . History of RIND (reversible ischemic neurological deficit)     Past Surgical History  Procedure Date  . Tonsillectomy   . Appendectomy   . Tumor removal     ovary  . Breast surgery     rt- lupectomy  . Eye surgery     cataract bilateral    History   Social History  . Marital Status: Married    Spouse Name: N/A    Number of Children: N/A  . Years of Education: N/A   Occupational History  . Not on file.   Social History Main Topics  . Smoking status: Never Smoker   .  Smokeless tobacco: Never Used  . Alcohol Use: No  . Drug Use: No  . Sexually Active: Not on file   Other Topics Concern  . Not on file   Social History Narrative  . No narrative on file    Family History  Problem Relation Age of Onset  . Cancer Sister     Current Outpatient Prescriptions on File Prior to Visit  Medication Sig Dispense Refill  . diclofenac sodium (VOLTAREN) 1 % GEL Place onto the skin as directed.        . diltiazem (CARDIZEM CD) 240 MG 24 hr capsule Take 240 mg by mouth daily.        . Hypromellose (GENTEAL) 0.3 % SOLN Apply to eye as needed. 1 drop in both eyes as needed       . levothyroxine (LEVOXYL) 50 MCG tablet Take 50 mcg by mouth daily.        . Multiple Vitamins-Minerals (ICAPS) CAPS Take by mouth.        . oxyCODONE (OXY IR/ROXICODONE) 5 MG immediate release tablet Take 5 mg by mouth every 4 (four) hours as needed.        . trandolapril (MAVIK) 4 MG  tablet Take 4 mg by mouth daily.        Marland Kitchen warfarin (COUMADIN) 3 MG tablet Take 3 mg by mouth daily.        . fosfomycin (MONUROL) 3 G PACK Take 3 g by mouth once.          Allergies as of 06/25/2011 - Review Complete 06/25/2011  Allergen Reaction Noted  . Amiodarone  04/05/2011  . Beet  06/22/2011  . Benzyl alc-guai-pse  04/05/2011  . Caffeine  04/05/2011  . Chocolate  04/05/2011  . Codeine phosphate  04/05/2011  . Darvon  04/05/2011  . Dilaudid (hydromorphone hcl)  04/05/2011  . Dimetapp cold-allergy  04/05/2011  . Dust mite extract  04/05/2011  . Dyazide (hydrochlorothiazide w/triamterene)  04/05/2011  . Erythromycin  04/05/2011  . Estrogens  04/05/2011  . Fungizone (amphotericin b)  04/05/2011  . Iodine  04/05/2011  . Librax  04/05/2011  . Micardis (telmisartan)  04/05/2011  . Milk (dairy)  04/05/2011  . Mold extract (trichophyton mentagrophyte)  04/05/2011  . Motrin (ibuprofen)  04/05/2011  . Penicillins  04/05/2011  . Petroleum jelly (petrolatum)  04/05/2011  . Pineapple  04/05/2011  .  Plavix (clopidogrel bisulfate)  04/05/2011  . Prednisone  04/05/2011  . Soap  04/05/2011  . Sulfamethoxazole  04/05/2011  . Tagamet (cimetidine)  04/05/2011  . Talwin  04/05/2011  . Tape  04/05/2011  . Watermelon concentrate  04/05/2011  . Zithromax (azithromycin dihydrate)  04/05/2011    Review of Systems (Positive items checked otherwise negative)  General: [ ]  Weight loss, [ ]  Weight gain, [ ]   Loss of appetite, [ ]  Fever  Neurologic: [ ]  Dizziness, [ ]  Blackouts, [ ]  Headaches, [ ]  Seizure  Ear/Nose/Throat: [X]  Change in eyesight, [X]  Change in hearing, [ ]  Nose bleeds, [ ]  Sore throat  Vascular: [X]  Pain in legs with walking, [ ]  Pain in feet while lying flat, [ ]  Non-healing ulcer, Stroke, [ ]  "Mini stroke", [ ]  Slurred speech, [ ]  Temporary blindness, [X]  Blood clot in vein, [X]  Phlebitis  Pulmonary: [ ]  Home oxygen, [ ]  Productive cough, [ ]  Bronchitis, [ ]  Coughing up blood,  [ ]  Asthma, [ ]  Wheezing  Musculoskeletal: [X]  Arthritis, [X]  Joint pain, [X]  Muscle pain  Cardiac: [ ]  Chest pain, [ ]  Chest tightness/pressure, [ ]  Shortness of breath when lying flat, [ ]  Shortness of breath with exertion, [ ]  Palpitations, [ ]  Heart murmur, [ ]  Arrythmia,  [X]  Atrial fibrillation  Hematologic: [ ]  Bleeding problems, [ ]  Clotting disorder, [X]  Anemia  Psychiatric:  [ ]  Depression, [ ]  Anxiety, [ ]  Attention deficit disorder  Gastrointestinal:  [X]  Black stool,[ ]   Blood in stool, [ ]  Peptic ulcer disease, [X]  Reflux, [ ]  Hiatal hernia, [ ]  Trouble swallowing, [ ]  Diarrhea, [ ]  Constipation  Urinary:  [ ]  Kidney disease, [X]  Burning with urination, [X]  Frequent urination, [X]  Difficulty urinating  Skin: [ ]  Ulcers, [ ]  Rashes   Physical Examination  Filed Vitals:   06/25/11 1142  BP: 150/88  Pulse: 101  Resp: 20  Height: 5\' 5"  (1.651 m)  Weight: 160 lb (72.576 kg)    General: A&O x 3, WDWN, Elderly and debilitated in appearance  Head: [place]/AT  Ear/Nose/Throat:  Hearing grossly intact, nares w/o erythema or drainage, oropharynx w/o Erythema/Exudate  Eyes: PERRLA, EOMI  Neck: Supple, no nuchal rigidity, no palpable LAD  Pulmonary: Sym exp, good air movt, CTAB, no rales, rhonchi, & wheezing  Cardiac: RRR, Nl S1, S2, no Murmurs, rubs or gallops  Vascular: Vessel Right Left  Radial Palpable Palpable  Brachial Palpable Palpable  Carotid Palpable, without bruit Palpable, without bruit  Aorta Non-palpable N/A  Femoral Palpable Palpable  Popliteal Non-palpable Non-palpable  PT Non-Palpable Non-Palpable  DP Faintly Palpable Faintly Palpable   Gastrointestinal: soft, NTND, -G/R, - HSM, - masses, - CVAT B  Musculoskeletal: M/S 5/5 throughout , Extremities without ischemic changes , extensive amount of varicosities on right lower leg, lipodermatosclerosis in both legs  Neurologic: CN 2-12 intact , Pain and light touch intact in extremities , Motor exam as listed above  Psychiatric: Judgment intact, Mood & affect appropriate for pt's clinical situation  Dermatologic: See M/S exam for extremity exam, no rashes otherwise noted  Lymph : No Cervical, Axillary, or Inguinal lymphadenopathy   Non-Invasive Vascular Imaging  BLE Venous Insufficiency Duplex (Date: 06/25/11):   RLE: R GSV with partially occlusive chronic thrombus, mild deep system reflux in R CFV, Reflux in R GSV  LLE: L deep system normal and no L GSV reflux  Outside Studies/Documentation 5 pages of outside documents were reviewed including: outside clinic chart detailing workup so far.  Medical Decision Making  [name] is a 77 y.o. female who presents with: BLE CVI and painful right varicosities.   Based on the patient's history and examination, I recommend: evaluation with Vein Clinic for right GSV stripping and ligation +/- stab phlebectomy of varicosities.  With a partially thrombosed R GSV, I am not certain EVLA would be appropriate, but there appears to be  evidence of venous reflux in the R GSV so this needs to be addressed like to with stripping and ligation.  She will continue with her thigh high compression stockings.  Thank you for allowing [place] to participate in this patient's care.  Leonides Sake, MD [facility] [place] Office: [phone] Pager: [phone]

## 2011-07-08 NOTE — Procedures (Unsigned)
LOWER EXTREMITY VENOUS REFLUX EXAM  INDICATION:  Painful varicose vein.  EXAM:  Using color-flow imaging and pulse Doppler spectral analysis, the right common femoral, femoral, popliteal, posterior tibial, great and small saphenous veins were evaluated.  There is evidence suggesting deep venous insufficiency of >500 milliseconds noted focally in the right common femoral vein.  The right saphenofemoral junction and nontortuous great saphenous vein demonstrate reflux of >500 milliseconds.  The right proximal short saphenous vein demonstrates competency.  GSV Diameter (used if found to be incompetent only)                                           Right    Left Proximal Greater Saphenous Vein           0.63 cm  cm Proximal-to-mid-thigh                     cm       cm Mid thigh                                 0.54cm   cm Mid-distal thigh                          cm       cm Distal thigh                              1.3 cm   cm Knee                                      0.67 cm  cm  IMPRESSION:  Right great saphenous and common femoral vein reflux noted, as described above.  ___________________________________________ Fransisco Hertz, MD  CH/MEDQ  D:  06/29/2011  T:  06/29/2011  Job:  [id number]

## 2011-09-08 ENCOUNTER — Telehealth: Payer: Self-pay | Admitting: *Deleted

## 2011-09-08 NOTE — Telephone Encounter (Signed)
Received call from [facility] with [name], patient is having TTM's sent to their office.  Last check normal, but nearing ERI.  Pt is due for follow up with [name] in January.  Reminder letter sent but no appt made yet.  Forwarded message to scheduling to have them contact patient to make appointment.

## 2011-10-04 ENCOUNTER — Encounter: Payer: Self-pay | Admitting: Vascular Surgery

## 2011-10-04 NOTE — Telephone Encounter (Signed)
Appt 1-22 with Dr Graciela Husbands to discuss

## 2011-10-05 ENCOUNTER — Encounter: Payer: Self-pay | Admitting: Vascular Surgery

## 2011-10-05 ENCOUNTER — Encounter: Payer: Medicare Other | Admitting: Internal Medicine

## 2011-10-05 ENCOUNTER — Ambulatory Visit: Payer: Medicare Other | Admitting: Vascular Surgery

## 2011-10-05 ENCOUNTER — Encounter: Payer: Self-pay | Admitting: Internal Medicine

## 2011-10-05 ENCOUNTER — Ambulatory Visit (INDEPENDENT_AMBULATORY_CARE_PROVIDER_SITE_OTHER): Payer: Medicare Other | Admitting: Vascular Surgery

## 2011-10-05 VITALS — BP 147/78 | HR 85 | Resp 16 | Ht 63.0 in | Wt 174.0 lb

## 2011-10-05 DIAGNOSIS — M79609 Pain in unspecified limb: Secondary | ICD-10-CM

## 2011-10-05 DIAGNOSIS — I83893 Varicose veins of bilateral lower extremities with other complications: Secondary | ICD-10-CM

## 2011-10-05 NOTE — Progress Notes (Signed)
Problems with Activities of Daily Living Secondary to Leg Pain  1. [name] states she has difficulty squatting and bending due to leg pain.  2. [name] states traveling in car and activities that require prolonged sitting are very difficult for her due to leg pain.  Rankin, [name]   Failure of  Conservative Therapy:  1. Worn 20-30 mm Hg thigh high compression hose >3 months with no relief of symptoms.  2. Frequently elevates legs-no relief of symptoms  3. Taken Ibuprofen 600 Mg TID with no relief of symptoms.  The patient continues to have largely discomfort right greater than left. He has worn compression garments. Her pain is mostly around the level of her right knee. He does not down typical for venous hypertension discomfort. Does have incontinence in her right common femoral vein which is mild. The majority of her incontinence is in her great saphenous vein. This is described options to her. I feel she would be an anatomic candidate for ablation of her great saphenous vein. I feel that the benefits that is certainly marginal. I explained at her that this is no risk to her to 10 year with her compression. She feels quite comfortable with the conservative treatment only and therefore we will not entertain ablation unless she develops more pain specifically later venous hypertension. She will see [place] on an as-needed basis

## 2011-10-12 ENCOUNTER — Encounter: Payer: Self-pay | Admitting: Internal Medicine

## 2011-10-12 ENCOUNTER — Ambulatory Visit (INDEPENDENT_AMBULATORY_CARE_PROVIDER_SITE_OTHER): Payer: Medicare Other | Admitting: Internal Medicine

## 2011-10-12 DIAGNOSIS — Z950 Presence of cardiac pacemaker: Secondary | ICD-10-CM

## 2011-10-12 DIAGNOSIS — Z95810 Presence of automatic (implantable) cardiac defibrillator: Secondary | ICD-10-CM

## 2011-10-12 DIAGNOSIS — I4891 Unspecified atrial fibrillation: Secondary | ICD-10-CM | POA: Insufficient documentation

## 2011-10-12 DIAGNOSIS — I495 Sick sinus syndrome: Secondary | ICD-10-CM | POA: Insufficient documentation

## 2011-10-12 LAB — PACEMAKER DEVICE OBSERVATION
BRDY-0002RV: 65 {beats}/min
RV LEAD IMPEDENCE PM: 664 Ohm

## 2011-10-12 MED ORDER — ATENOLOL 25 MG PO TABS: 25.0000 mg | ORAL_TABLET | Freq: Every day | ORAL | Status: DC

## 2011-10-12 NOTE — Assessment & Plan Note (Signed)
Her device has reached ERI. We have discussed the procedural risks and benefits including not limited to infection. She understands procedure and is willing to proceed

## 2011-10-12 NOTE — Assessment & Plan Note (Signed)
Permanent on warfarin with rate control it could be improved upon she has about 70% of her beats faster than 80 although maybe about 10% of her beats faster than 100

## 2011-10-12 NOTE — Patient Instructions (Addendum)
Your physician has recommended that you have a pacemaker generator change. These are some available dates that Dr. Graciela Husbands could do a battery change for you: Monday 2/11 Wednesday 2/13 Monday 2/18 Monday 2/25 Monday 3/4 Wednesday 3/6  Please call Dr. Odessa Fleming nurse, Herbert Seta, when you are ready to schedule your procedure. [phone].  Your physician has recommended you make the following change in your medication:  1) Start atenolol 25 mg one tablet daily.

## 2011-10-12 NOTE — Progress Notes (Signed)
HPI  [name] is a 78 y.o. female Seen at the request of [facility] for pacemaker followup. This was implanted 2004 for tachybradycardia syndrome. She initially had paroxysmal atrial fibrillation treated with sotalol which apparently by history is permanent. She does take warfarin.  Her device has reached ERI.  She also has a history of hypertension.  She was widowed about a year ago. She has no children and no family. She is quite isolated and has had a  very hard year.  The patient denies chest pain, shortness of breath, nocturnal dyspnea, orthopnea or peripheral edema.  There have been no palpitations, lightheadedness or syncope.     Past Medical History  Diagnosis Date  . Arthritis   . Chronic atrial fibrillation on chronic coumadin anticoagulation  . Hypertension   . Thyroid disease hypothyroidism  . Esophageal reflux with paraesophageal hernia  . Diverticulosis   . Hx of breast cancer s/p lumpectomy   . Spinal stenosis with chronic LBP  . Anemia   . Vitamin D deficiency   . Depression   . Osteoporosis   . UTI (lower urinary tract infection)   . Proteus mirabilis infection   . Tachy-brady syndrome   . Cancer     history of breast cancer with a lumpectomy  . Chest pain   . Varicose veins   . Fatigue   . History of depression   . History of RIND (reversible ischemic neurological deficit)     Past Surgical History  Procedure Date  . Tonsillectomy   . Appendectomy   . Tumor removal     ovary  . Breast surgery     rt- lupectomy  . Eye surgery     cataract bilateral    Current Outpatient Prescriptions  Medication Sig Dispense Refill  . diclofenac sodium (VOLTAREN) 1 % GEL Place onto the skin as directed.        . diltiazem (CARDIZEM CD) 240 MG 24 hr capsule Take 240 mg by mouth daily.        . fosfomycin (MONUROL) 3 G PACK Take 3 g by mouth once.        . Hypromellose (GENTEAL) 0.3 % SOLN Apply to eye as needed. 1 drop in both eyes as needed        . levothyroxine (LEVOXYL) 50 MCG tablet Take 50 mcg by mouth daily.        . Multiple Vitamins-Minerals (ICAPS) CAPS Take by mouth.        . oxyCODONE (OXY IR/ROXICODONE) 5 MG immediate release tablet Take 5 mg by mouth every 4 (four) hours as needed.        . trandolapril (MAVIK) 4 MG tablet Take 4 mg by mouth daily.        . warfarin (COUMADIN) 3 MG tablet Take 3 mg by mouth daily.          Allergies  Allergen Reactions  . Amiodarone     dizziness  . Beet   . Benzyl Alc-Guai-Pse   . Caffeine   . Chocolate   . Codeine Phosphate   . Darvon   . Dilaudid (Hydromorphone Hcl)   . Dimetapp Cold-Allergy   . Dust Mite Extract   . Dyazide (Hydrochlorothiazide W/Triamterene)     rash  . Erythromycin   . Estrogens   . Fungizone (Amphotericin B)   . Iodine   . Librax   . Micardis (Telmisartan)     Fatigue and back pain  . Milk (Dairy)   .   Mold Extract (Trichophyton Mentagrophyte)   . Motrin (Ibuprofen)   . Penicillins   . Petroleum Jelly (Petrolatum)   . Pineapple   . Plavix (Clopidogrel Bisulfate)     dizziness  . Prednisone   . Soap   . Sulfamethoxazole   . Tagamet (Cimetidine)   . Talwin   . Tape   . Watermelon Concentrate   . Zithromax (Azithromycin Dihydrate)     Review of Systems negative except from HPI and PMH  Physical Exam BP 149/89  Pulse 88  Ht 5' 4" (1.626 m)  Wt 171 lb 12.8 oz (77.928 kg)  BMI 29.49 kg/m2 Well developed and well nourished in no acute distress HENT normal E scleral and icterus clear Neck Supple JVP flat; carotids brisk and full Clear to ausculation Device pocket well-healed  Regular rate and rhythm, no murmurs gallops or rub Soft with active bowel sounds No clubbing cyanosis none Edema Alert and oriented, grossly normal motor and sensory function Skin Warm and Dry   Assessment and  Plan  

## 2011-10-15 ENCOUNTER — Telehealth: Payer: Self-pay | Admitting: Internal Medicine

## 2011-10-15 NOTE — Telephone Encounter (Signed)
Pt calling re questions re procedure

## 2011-10-15 NOTE — Telephone Encounter (Signed)
I spoke with the patient. She states that she has had problems with her BP going up to a systolic of around 140. She has a sore spot (sore to touch) on the side of her face and she has dizziness. I explained to her that the dizziness could be coming from the fact that her device has reached ERI and could be contributing her symptoms. I have advised that she continue her atenolol 25 mg once daily as she just started this yesterday. I have also advised that we get her battery changed as quickly as possible. She will call me next week to schedule. I explained to her that her BP may also be stress driven since she is in the process of moving.

## 2011-10-18 ENCOUNTER — Telehealth: Payer: Self-pay | Admitting: Internal Medicine

## 2011-10-18 DIAGNOSIS — I495 Sick sinus syndrome: Secondary | ICD-10-CM

## 2011-10-18 NOTE — Telephone Encounter (Signed)
New Msg: Pt calling wanting to speak with nurse and see if pt can have her surgery Feb 25th, 2013. Please return pt call to discuss further.

## 2011-10-19 ENCOUNTER — Encounter: Payer: Self-pay | Admitting: *Deleted

## 2011-10-19 ENCOUNTER — Other Ambulatory Visit: Payer: Self-pay | Admitting: Internal Medicine

## 2011-10-19 DIAGNOSIS — Z950 Presence of cardiac pacemaker: Secondary | ICD-10-CM

## 2011-10-19 NOTE — Telephone Encounter (Signed)
I spoke with the patient. She is scheduled for her PPM generator change on 11/08/11. She will come for labs Monday 11/01/11. I will mail her a written copy of her instructions. She will be moving on Thursday this week. Her new address will be: [address] [place], [place] [address].  I have informed her that I will mail her instructions to the new address. She voices understanding.

## 2011-10-20 ENCOUNTER — Telehealth: Payer: Self-pay | Admitting: Internal Medicine

## 2011-10-20 NOTE — Telephone Encounter (Signed)
I spoke with the patient and made her aware of Dr. Odessa Fleming recommendations. She does not want to take another medication and therefore does not want a prescription for a diuretic. She is reporting lower extremity edema. I explained to her that she will most likely not feel better until her device is changed out. She has agreed to move her date up to 11/01/11. I have rescheduled this with the EP lab. She will come on 10/25/11 for her labwork.

## 2011-10-20 NOTE — Telephone Encounter (Signed)
Pt is complaining of sob x 3 days mostly at night but also during the day.  She had two spells last night and one today so far.  She states she usually gets the sob most nights and most days.  It is not exertional and stops on it's own with rest.

## 2011-10-20 NOTE — Telephone Encounter (Signed)
I spoke with Dr. Graciela Husbands about the patient's symptoms. He feels her symptoms are most likely due to the fact that her device has reverted to VVI since hitting ERI. He states that we could possibly give her a diuretic if she is noticing swelling, but otherwise, he would encourage her to move up there generator change. I explained I will try to get her to do this, but she is moving this week and wanted to get settled before proceeding, which is why she chose the end of the month. I have attempted to contact the patient, but her number is ringing busy x 2 attempts at this time.

## 2011-10-22 ENCOUNTER — Encounter (HOSPITAL_COMMUNITY): Payer: Self-pay | Admitting: Pharmacy Technician

## 2011-10-22 ENCOUNTER — Other Ambulatory Visit: Payer: Self-pay

## 2011-10-22 ENCOUNTER — Emergency Department (HOSPITAL_COMMUNITY)
Admission: EM | Admit: 2011-10-22 | Discharge: 2011-10-22 | Disposition: A | Discharge status: Home Health | Payer: Medicare Other | Attending: Emergency Medicine | Admitting: Emergency Medicine

## 2011-10-22 ENCOUNTER — Emergency Department (HOSPITAL_COMMUNITY): Payer: Medicare Other

## 2011-10-22 DIAGNOSIS — E079 Disorder of thyroid, unspecified: Secondary | ICD-10-CM | POA: Insufficient documentation

## 2011-10-22 DIAGNOSIS — Z853 Personal history of malignant neoplasm of breast: Secondary | ICD-10-CM | POA: Insufficient documentation

## 2011-10-22 DIAGNOSIS — K219 Gastro-esophageal reflux disease without esophagitis: Secondary | ICD-10-CM | POA: Insufficient documentation

## 2011-10-22 DIAGNOSIS — IMO0001 Reserved for inherently not codable concepts without codable children: Secondary | ICD-10-CM | POA: Insufficient documentation

## 2011-10-22 DIAGNOSIS — F329 Major depressive disorder, single episode, unspecified: Secondary | ICD-10-CM | POA: Insufficient documentation

## 2011-10-22 DIAGNOSIS — F3289 Other specified depressive episodes: Secondary | ICD-10-CM | POA: Insufficient documentation

## 2011-10-22 DIAGNOSIS — Z950 Presence of cardiac pacemaker: Secondary | ICD-10-CM | POA: Insufficient documentation

## 2011-10-22 DIAGNOSIS — I498 Other specified cardiac arrhythmias: Secondary | ICD-10-CM | POA: Insufficient documentation

## 2011-10-22 DIAGNOSIS — M129 Arthropathy, unspecified: Secondary | ICD-10-CM | POA: Insufficient documentation

## 2011-10-22 DIAGNOSIS — M810 Age-related osteoporosis without current pathological fracture: Secondary | ICD-10-CM | POA: Insufficient documentation

## 2011-10-22 DIAGNOSIS — R0602 Shortness of breath: Secondary | ICD-10-CM | POA: Insufficient documentation

## 2011-10-22 DIAGNOSIS — I10 Essential (primary) hypertension: Secondary | ICD-10-CM | POA: Insufficient documentation

## 2011-10-22 DIAGNOSIS — R059 Cough, unspecified: Secondary | ICD-10-CM | POA: Insufficient documentation

## 2011-10-22 DIAGNOSIS — Z794 Long term (current) use of insulin: Secondary | ICD-10-CM | POA: Insufficient documentation

## 2011-10-22 DIAGNOSIS — R05 Cough: Secondary | ICD-10-CM | POA: Insufficient documentation

## 2011-10-22 DIAGNOSIS — Z79899 Other long term (current) drug therapy: Secondary | ICD-10-CM | POA: Insufficient documentation

## 2011-10-22 DIAGNOSIS — R0600 Dyspnea, unspecified: Secondary | ICD-10-CM

## 2011-10-22 LAB — DIFFERENTIAL
Eosinophils Absolute: 0.1 10*3/uL (ref 0.0–0.7)
Eosinophils Relative: 2 % (ref 0–5)
Lymphs Abs: 0.9 10*3/uL (ref 0.7–4.0)
Monocytes Relative: 14 % — ABNORMAL HIGH (ref 3–12)
Neutrophils Relative %: 63 % (ref 43–77)

## 2011-10-22 LAB — URINALYSIS, ROUTINE W REFLEX MICROSCOPIC
Bilirubin Urine: NEGATIVE
Hgb urine dipstick: NEGATIVE
Specific Gravity, Urine: 1.023 (ref 1.005–1.030)
Urobilinogen, UA: 0.2 mg/dL (ref 0.0–1.0)
pH: 5.5 (ref 5.0–8.0)

## 2011-10-22 LAB — BASIC METABOLIC PANEL
BUN: 17 mg/dL (ref 6–23)
GFR calc non Af Amer: 52 mL/min — ABNORMAL LOW (ref >90)
Glucose, Bld: 108 mg/dL — ABNORMAL HIGH (ref 70–99)
Potassium: 4.2 mEq/L (ref 3.5–5.1)

## 2011-10-22 LAB — URINE MICROSCOPIC-ADD ON

## 2011-10-22 LAB — CBC
HCT: 25.9 % — ABNORMAL LOW (ref 36.0–46.0)
Hemoglobin: 7.7 g/dL — ABNORMAL LOW (ref 12.0–15.0)
MCH: 24.5 pg — ABNORMAL LOW (ref 26.0–34.0)
MCV: 82.5 fL (ref 78.0–100.0)
RBC: 3.14 MIL/uL — ABNORMAL LOW (ref 3.87–5.11)

## 2011-10-22 LAB — PROTIME-INR: INR: 2.85 — ABNORMAL HIGH (ref 0.00–1.49)

## 2011-10-22 NOTE — ED Notes (Signed)
Patient is dressed and currently waiting in room for PTAR

## 2011-10-22 NOTE — ED Notes (Signed)
[facility] notified for transport back to [facility].

## 2011-10-22 NOTE — ED Notes (Signed)
[facility] is here and transporting patient back home. Pt left with all personal belongings.

## 2011-10-22 NOTE — ED Provider Notes (Signed)
History     CSN: [id number]  Arrival date & time 10/22/11  0020   First MD Initiated Contact with Patient 10/22/11 [phone]      Chief Complaint  Patient presents with  . Shortness of Breath    (Consider location/radiation/quality/duration/timing/severity/associated sxs/prior treatment) Patient is a 78 y.o. female presenting with shortness of breath. The history is provided by the patient.  Shortness of Breath  Associated symptoms include shortness of breath.   the patient is an 78 year old, female, who presents to the emergency department complaining of progressive shortness of breath.  She had very brief episode of left-sided chest pain.  Yesterday.  She states that she gets similar chest pain.  Daily.  She denies cough.  She denies fevers, chills, nausea, vomiting.  She is asymptomatic now and does not want pain medications.  Past Medical History  Diagnosis Date  . Arthritis   . Chronic atrial fibrillation       chronic coumadin anticoagulation  . Hypertension   . Thyroid disease hypothyroidism  . Esophageal reflux with paraesophageal hernia  . Diverticulosis   . Hx of breast cancer s/p lumpectomy   . Spinal stenosis with chronic LBP  . Anemia   . Vitamin d deficiency   . Depression   . Osteoporosis   . UTI (lower urinary tract infection)   . Tachy-brady syndrome   . Cancer     history of breast cancer with a lumpectomy  . Chest pain   . Varicose veins   . Fatigue   . History of RIND (reversible ischemic neurological deficit)   . Pacemaker     medtronic    Past Surgical History  Procedure Date  . Tonsillectomy   . Appendectomy   . Tumor removal     ovary  . Breast surgery     rt- lupectomy  . Eye surgery     cataract bilateral    Family History  Problem Relation Age of Onset  . Cancer Sister     History  Substance Use Topics  . Smoking status: Never Smoker   . Smokeless tobacco: Never Used  . Alcohol Use: No    OB History    Grav Para Term Preterm  Abortions TAB SAB Ect Mult Living                  Review of Systems  Respiratory: Positive for shortness of breath.     Allergies  Amiodarone; Beet; Benzyl alc-guai-pse; Caffeine; Chocolate; Codeine phosphate; Darvon; Dilaudid; Dimetapp cold-allergy; Dust mite extract; Dyazide; Erythromycin; Estrogens; Fungizone; Iodine; Librax; Micardis; Milk; Mold extract; Motrin; Penicillins; Petroleum jelly; Pineapple; Plavix; Prednisone; Soap; Sulfamethoxazole; Tagamet; Talwin; Tape; Watermelon concentrate; and Zithromax  Home Medications   Current Outpatient Rx  Name Route Sig Dispense Refill  . TRANDOLAPRIL 4 MG PO TABS Oral Take 4 mg by mouth daily.      . WARFARIN SODIUM 3 MG PO TABS Oral Take 3 mg by mouth daily.      . ATENOLOL 25 MG PO TABS Oral Take 1 tablet (25 mg total) by mouth daily. 30 tablet 6  . DICLOFENAC SODIUM 1 % TD GEL Transdermal Place onto the skin as directed.      Marland Kitchen DILTIAZEM HCL ER COATED BEADS 240 MG PO CP24 Oral Take 240 mg by mouth daily.      Marland Kitchen HYPROMELLOSE 0.3 % OP SOLN Ophthalmic Apply to eye as needed. 1 drop in both eyes as needed     . LEVOTHYROXINE SODIUM  50 MCG PO TABS Oral Take 50 mcg by mouth daily.      . ICAPS PO CAPS Oral Take by mouth.      . OXYCODONE HCL 5 MG PO TABS Oral Take 5 mg by mouth every 4 (four) hours as needed. For pain      BP 150/83  Pulse 80  Temp(Src) 98.5 F (36.9 C) (Oral)  Resp 18  SpO2 95%  Physical Exam  Vitals reviewed. Constitutional: She is oriented to person, place, and time. She appears well-developed and well-nourished. No distress.  HENT:  Head: Normocephalic and atraumatic.  Eyes: Conjunctivae are normal. Pupils are equal, round, and reactive to light.  Neck: Normal range of motion. Neck supple.  Cardiovascular: Normal rate and regular rhythm.  Exam reveals gallop.   No murmur heard. Pulmonary/Chest: Effort normal and breath sounds normal. No respiratory distress.  Abdominal: Soft. There is no tenderness.    Musculoskeletal: Normal range of motion. She exhibits tenderness.       She says she has chronic pain and swelling in her calves  Neurological: She is alert and oriented to person, place, and time.  Skin: Skin is warm and dry.  Psychiatric: She has a normal mood and affect. Thought content normal.    ED Course  Procedures (including critical care time)   Labs Reviewed  CBC  DIFFERENTIAL  BASIC METABOLIC PANEL  URINALYSIS, ROUTINE W REFLEX MICROSCOPIC   No results found.   No diagnosis found.  2:32 AM Still asx  MDM  dyspnea        Nicholes Stairs, MD 10/22/11 [phone]

## 2011-10-22 NOTE — ED Notes (Signed)
EKG DONE BY EMT R Dayle Mcnerney- OLD AND NEW EKG GIVEN TO DR CAPAROSSI 

## 2011-10-22 NOTE — ED Notes (Signed)
Patient resides in an independent living facility. Presents to [facility] via EMS with an acute onset of dyspnea with miniminal exertion 1600 yesterday evening. Also reports having a concurrent episode of left chest pain radiating to left breast, which lasted "just a few seconds" and went alone with no sort of intervention. Patient also reports a single episode of dizziness just prior to the above mentioned events that too has resolved. Denies any vision changes, syncope, n/v, sweats, fevers, chills or recent illness. No recent weight loss or weight gain. No significant cough or lower extremity swelling. No abd pain, constipation or diarrhea. Has hx A-fib and is on chronic warfarin therapy, managed by Dr. Foy Guadalajara. Last INR was 4 days ago Also has hx sinoatrial node dysfunction and is followed by her Cardiologist, Dr. Graciela Husbands. Was seen 4 days ago for pacemaker device observation is scheduled to have pacemaker generator changed on 11/01/11. Currently has no CP or SOB at this time.

## 2011-10-26 ENCOUNTER — Other Ambulatory Visit (INDEPENDENT_AMBULATORY_CARE_PROVIDER_SITE_OTHER): Payer: Medicare Other | Admitting: *Deleted

## 2011-10-26 DIAGNOSIS — I495 Sick sinus syndrome: Secondary | ICD-10-CM

## 2011-10-26 LAB — CBC WITH DIFFERENTIAL/PLATELET
Basophils Absolute: 0 10*3/uL (ref 0.0–0.1)
Eosinophils Absolute: 0 10*3/uL (ref 0.0–0.7)
Lymphocytes Relative: 20 % (ref 12.0–46.0)
MCHC: 30.8 g/dL (ref 30.0–36.0)
Neutrophils Relative %: 63.8 % (ref 43.0–77.0)
Platelets: 227 10*3/uL (ref 150.0–400.0)
RDW: 16.7 % — ABNORMAL HIGH (ref 11.5–14.6)

## 2011-10-26 LAB — BASIC METABOLIC PANEL
Chloride: 104 mEq/L (ref 96–112)
GFR: 52.41 mL/min — ABNORMAL LOW (ref >60.00)
Potassium: 4.3 mEq/L (ref 3.5–5.1)

## 2011-10-26 LAB — PROTIME-INR: Prothrombin Time: 36.3 s — ABNORMAL HIGH (ref 10.2–12.4)

## 2011-10-27 ENCOUNTER — Telehealth: Payer: Self-pay | Admitting: *Deleted

## 2011-10-27 NOTE — Telephone Encounter (Signed)
The patient's pre-procedure labs were reviewed with Dr. Graciela Husbands. He recommends the patient start to hold her coumadin now in preparation for her procedure on Monday. He states that she has seen Dr. Darnelle Catalan and that he is probably following her anemia. He will call him on Monday if he feels this is necessary. It looks as though her anemia has been stable. Dr. Graciela Husbands attempted to call the patient tonight to speak with her about her labs and holding her coumadin. There is no answer at her home number and her emergency contact does not answer either. I will forward this message to triage to ask them to call the patient tomorrow with instructions to hold her coumadin starting now.

## 2011-10-28 ENCOUNTER — Other Ambulatory Visit: Payer: Self-pay

## 2011-10-28 ENCOUNTER — Encounter (HOSPITAL_COMMUNITY): Payer: Self-pay

## 2011-10-28 ENCOUNTER — Emergency Department (HOSPITAL_COMMUNITY): Payer: Medicare Other

## 2011-10-28 ENCOUNTER — Inpatient Hospital Stay (HOSPITAL_COMMUNITY)
Admission: EM | Admit: 2011-10-28 | Discharge: 2011-11-03 | DRG: 258 | Disposition: A | Discharge status: Skilled Nursing Facility | Payer: Medicare Other | Attending: Family Medicine | Admitting: Family Medicine

## 2011-10-28 DIAGNOSIS — I5030 Unspecified diastolic (congestive) heart failure: Secondary | ICD-10-CM

## 2011-10-28 DIAGNOSIS — R0600 Dyspnea, unspecified: Secondary | ICD-10-CM

## 2011-10-28 DIAGNOSIS — D509 Iron deficiency anemia, unspecified: Secondary | ICD-10-CM | POA: Diagnosis present

## 2011-10-28 DIAGNOSIS — I5033 Acute on chronic diastolic (congestive) heart failure: Principal | ICD-10-CM | POA: Diagnosis present

## 2011-10-28 DIAGNOSIS — M810 Age-related osteoporosis without current pathological fracture: Secondary | ICD-10-CM | POA: Diagnosis present

## 2011-10-28 DIAGNOSIS — I509 Heart failure, unspecified: Secondary | ICD-10-CM | POA: Diagnosis present

## 2011-10-28 DIAGNOSIS — R0602 Shortness of breath: Secondary | ICD-10-CM

## 2011-10-28 DIAGNOSIS — E039 Hypothyroidism, unspecified: Secondary | ICD-10-CM | POA: Diagnosis present

## 2011-10-28 DIAGNOSIS — Z950 Presence of cardiac pacemaker: Secondary | ICD-10-CM | POA: Insufficient documentation

## 2011-10-28 DIAGNOSIS — Z45018 Encounter for adjustment and management of other part of cardiac pacemaker: Secondary | ICD-10-CM

## 2011-10-28 DIAGNOSIS — I10 Essential (primary) hypertension: Secondary | ICD-10-CM | POA: Diagnosis present

## 2011-10-28 DIAGNOSIS — J9600 Acute respiratory failure, unspecified whether with hypoxia or hypercapnia: Secondary | ICD-10-CM | POA: Diagnosis present

## 2011-10-28 DIAGNOSIS — I482 Chronic atrial fibrillation, unspecified: Secondary | ICD-10-CM | POA: Diagnosis present

## 2011-10-28 DIAGNOSIS — E876 Hypokalemia: Secondary | ICD-10-CM | POA: Diagnosis present

## 2011-10-28 DIAGNOSIS — I4891 Unspecified atrial fibrillation: Secondary | ICD-10-CM | POA: Diagnosis present

## 2011-10-28 DIAGNOSIS — I279 Pulmonary heart disease, unspecified: Secondary | ICD-10-CM | POA: Diagnosis present

## 2011-10-28 DIAGNOSIS — Z853 Personal history of malignant neoplasm of breast: Secondary | ICD-10-CM

## 2011-10-28 DIAGNOSIS — I495 Sick sinus syndrome: Secondary | ICD-10-CM

## 2011-10-28 DIAGNOSIS — R001 Bradycardia, unspecified: Secondary | ICD-10-CM

## 2011-10-28 DIAGNOSIS — D649 Anemia, unspecified: Secondary | ICD-10-CM

## 2011-10-28 DIAGNOSIS — R0902 Hypoxemia: Secondary | ICD-10-CM | POA: Diagnosis present

## 2011-10-28 DIAGNOSIS — Z7901 Long term (current) use of anticoagulants: Secondary | ICD-10-CM

## 2011-10-28 HISTORY — DX: Hypothyroidism, unspecified: E03.9

## 2011-10-28 LAB — PRO B NATRIURETIC PEPTIDE: Pro B Natriuretic peptide (BNP): 2930 pg/mL — ABNORMAL HIGH (ref 0–450)

## 2011-10-28 LAB — CBC
HCT: 27.2 % — ABNORMAL LOW (ref 36.0–46.0)
MCV: 82.2 fL (ref 78.0–100.0)
Platelets: 217 10*3/uL (ref 150–400)
RBC: 3.31 MIL/uL — ABNORMAL LOW (ref 3.87–5.11)
WBC: 4.1 10*3/uL (ref 4.0–10.5)

## 2011-10-28 LAB — COMPREHENSIVE METABOLIC PANEL
AST: 22 U/L (ref 0–37)
Alkaline Phosphatase: 90 U/L (ref 39–117)
BUN: 16 mg/dL (ref 6–23)
CO2: 26 mEq/L (ref 19–32)
Chloride: 103 mEq/L (ref 96–112)
Creatinine, Ser: 0.94 mg/dL (ref 0.50–1.10)
GFR calc non Af Amer: 52 mL/min — ABNORMAL LOW (ref >90)
Potassium: 4 mEq/L (ref 3.5–5.1)
Total Bilirubin: 0.3 mg/dL (ref 0.3–1.2)

## 2011-10-28 LAB — ABO/RH: ABO/RH(D): A NEG

## 2011-10-28 LAB — POCT I-STAT TROPONIN I: Troponin i, poc: 0.01 ng/mL (ref 0.00–0.08)

## 2011-10-28 LAB — PREPARE RBC (CROSSMATCH)

## 2011-10-28 LAB — PROTIME-INR: INR: 3.16 — ABNORMAL HIGH (ref 0.00–1.49)

## 2011-10-28 MED ORDER — OXYCODONE HCL 5 MG PO TABS
5.0000 mg | ORAL_TABLET | Freq: Three times a day (TID) | ORAL | Status: DC | PRN
  Administered 2011-10-28 – 2011-11-02 (×3): 5 mg via ORAL
  Filled 2011-10-28 (×4): qty 1

## 2011-10-28 MED ORDER — ALBUTEROL SULFATE (5 MG/ML) 0.5% IN NEBU
2.5000 mg | INHALATION_SOLUTION | Freq: Four times a day (QID) | RESPIRATORY_TRACT | Status: DC
  Administered 2011-10-29 (×3): 2.5 mg via RESPIRATORY_TRACT
  Filled 2011-10-28 (×5): qty 0.5

## 2011-10-28 MED ORDER — FUROSEMIDE 10 MG/ML IJ SOLN
20.0000 mg | Freq: Four times a day (QID) | INTRAMUSCULAR | Status: AC
  Administered 2011-10-29 (×4): 20 mg via INTRAVENOUS
  Filled 2011-10-28 (×4): qty 2

## 2011-10-28 MED ORDER — ONDANSETRON HCL 4 MG/2ML IJ SOLN: 4.0000 mg | Freq: Four times a day (QID) | INTRAMUSCULAR | Status: DC | PRN

## 2011-10-28 MED ORDER — SODIUM CHLORIDE 0.9 % IV SOLN: 250.0000 mL | INTRAVENOUS | Status: DC | PRN

## 2011-10-28 MED ORDER — IPRATROPIUM BROMIDE 0.02 % IN SOLN
0.5000 mg | Freq: Four times a day (QID) | RESPIRATORY_TRACT | Status: DC
  Administered 2011-10-29 (×3): 0.5 mg via RESPIRATORY_TRACT
  Filled 2011-10-28 (×5): qty 2.5

## 2011-10-28 MED ORDER — TRANDOLAPRIL 4 MG PO TABS
4.0000 mg | ORAL_TABLET | Freq: Every day | ORAL | Status: DC
  Administered 2011-10-29 – 2011-10-31 (×3): 4 mg via ORAL
  Filled 2011-10-28 (×3): qty 1

## 2011-10-28 MED ORDER — DILTIAZEM HCL ER COATED BEADS 240 MG PO CP24
240.0000 mg | ORAL_CAPSULE | Freq: Every day | ORAL | Status: DC
  Administered 2011-10-29 – 2011-11-03 (×6): 240 mg via ORAL
  Filled 2011-10-28 (×7): qty 1

## 2011-10-28 MED ORDER — ACETAMINOPHEN 325 MG PO TABS: 650.0000 mg | ORAL_TABLET | ORAL | Status: DC | PRN

## 2011-10-28 MED ORDER — LEVOTHYROXINE SODIUM 75 MCG PO TABS
37.5000 ug | ORAL_TABLET | Freq: Every day | ORAL | Status: DC
  Administered 2011-10-30 – 2011-11-03 (×6): 37.5 ug via ORAL
  Filled 2011-10-28 (×6): qty 0.5

## 2011-10-28 MED ORDER — CARVEDILOL 6.25 MG PO TABS
6.2500 mg | ORAL_TABLET | Freq: Two times a day (BID) | ORAL | Status: DC
  Administered 2011-10-29 – 2011-11-01 (×7): 6.25 mg via ORAL
  Filled 2011-10-28 (×10): qty 1

## 2011-10-28 MED ORDER — LEVOTHYROXINE SODIUM 50 MCG PO TABS
50.0000 ug | ORAL_TABLET | Freq: Every day | ORAL | Status: DC
  Administered 2011-10-29: 50 ug via ORAL
  Filled 2011-10-28: qty 1

## 2011-10-28 MED ORDER — ISOSORBIDE MONONITRATE 15 MG HALF TABLET
15.0000 mg | ORAL_TABLET | Freq: Every day | ORAL | Status: DC
  Administered 2011-10-29 – 2011-10-30 (×2): 15 mg via ORAL
  Filled 2011-10-28 (×3): qty 1

## 2011-10-28 MED ORDER — SODIUM CHLORIDE 0.9 % IJ SOLN: 3.0000 mL | INTRAMUSCULAR | Status: DC | PRN

## 2011-10-28 MED ORDER — SODIUM CHLORIDE 0.9 % IJ SOLN
3.0000 mL | Freq: Two times a day (BID) | INTRAMUSCULAR | Status: DC
  Administered 2011-10-28: 3 mL via INTRAVENOUS

## 2011-10-28 NOTE — Telephone Encounter (Signed)
No answer at pts home number, spoke with pts emergency contact. The pt has recently moved and her phone is not in service. The person I talked with took the number and wuill have the pt call.

## 2011-10-28 NOTE — Progress Notes (Signed)
ANTICOAGULATION CONSULT NOTE - Initial Consult  Pharmacy Consult for Warfarin  Indication: atrial fibrillation  Allergies  Allergen Reactions  . Amiodarone Other (See Comments)    dizziness  . Beet Other (See Comments)    unknown  . Benzyl Alc-Guai-Pse Other (See Comments)    unknown  . Caffeine Other (See Comments)    unknown  . Chocolate Other (See Comments)    unknown  . Codeine Phosphate Other (See Comments)    unknown  . Darvon Other (See Comments)    unknown  . Dilaudid (Hydromorphone Hcl) Other (See Comments)    unknown  . Dimetapp Cold-Allergy Other (See Comments)    unknown  . Dust Mite Extract Other (See Comments)    unknown  . Estrogens Other (See Comments)    unknown  . Fungizone (Amphotericin B) Other (See Comments)    unknown  . Iodine Other (See Comments)    unknown  . Librax Other (See Comments)    unknown  . Micardis (Telmisartan) Other (See Comments)    Fatigue and back pain  . Milk (Dairy) Other (See Comments)    unknown  . Mold Extract (Trichophyton Mentagrophyte) Other (See Comments)    unknown  . Motrin (Ibuprofen) Other (See Comments)    unknown  . Penicillins Other (See Comments)    unknown  . Petroleum Jelly (Petrolatum) Other (See Comments)    unknown  . Pineapple Other (See Comments)    unknown  . Plavix (Clopidogrel Bisulfate) Other (See Comments)    dizziness  . Prednisone Other (See Comments)    unknown  . Soap Other (See Comments)    unknown  . Sulfamethoxazole Other (See Comments)    unknown  . Tagamet (Cimetidine) Other (See Comments)    unknown  . Talwin Other (See Comments)    unknown  . Tape Other (See Comments)    unknown  . Watermelon Concentrate Other (See Comments)    unknown  . Zithromax (Azithromycin Dihydrate) Other (See Comments)    unknown  . Dyazide (Hydrochlorothiazide W/Triamterene) Rash  . Erythromycin Other (See Comments)    unknown    Patient Measurements: Height: 5\' 4"  (162.6 cm) Weight: 175  lb 14.8 oz (79.8 kg) IBW/kg (Calculated) : 54.7   Vital Signs: Temp: 98.2 F (36.8 C) (02/14 2110) Temp src: Oral (02/14 2110) BP: 129/77 mmHg (02/14 2110) Pulse Rate: 67  (02/14 2110)  Labs:  Basename 10/28/11 1846 10/28/11 1634 10/26/11 1241  HGB -- 8.1* 8.1 Repeated and verified X2.*  HCT -- 27.2* 26.4*  PLT -- 217 227.0  APTT -- -- --  LABPROT 32.9* -- 36.3*  INR 3.16* -- 3.3*  HEPARINUNFRC -- -- --  CREATININE -- 0.94 1.1  CKTOTAL -- -- --  CKMB -- -- --  TROPONINI -- -- --   Estimated Creatinine Clearance: 41.4 ml/min (by C-G formula based on Cr of 0.94).  Medical History: Past Medical History  Diagnosis Date  . Arthritis   . Chronic atrial fibrillation       chronic coumadin anticoagulation  . Hypertension   . Thyroid disease     hypothyroidism  . Esophageal reflux     with paraesophageal hernia  . Diverticulosis   . Hx of breast cancer     s/p lumpectomy   . Spinal stenosis     with chronic LBP  . Anemia   . Vitamin d deficiency   . Depression   . Osteoporosis   . UTI (lower urinary tract infection)   .  Tachy-brady syndrome   . Chest pain   . Varicose veins   . Fatigue   . History of RIND (reversible ischemic neurological deficit)   . Pacemaker     medtronic    Medications:  Prescriptions prior to admission  Medication Sig Dispense Refill  . cholecalciferol (VITAMIN D) 400 UNITS TABS Take 400 Units by mouth daily.      . diclofenac sodium (VOLTAREN) 1 % GEL Place 1 application onto the skin 2 (two) times daily as needed. For knee pain      . diltiazem (CARDIZEM CD) 240 MG 24 hr capsule Take 240 mg by mouth daily.        . Hypromellose (GENTEAL) 0.3 % SOLN Place 1 drop into both eyes at bedtime.       Marland Kitchen levothyroxine (SYNTHROID, LEVOTHROID) 75 MCG tablet Take 37.5 mcg by mouth daily.      . Multiple Vitamins-Minerals (ICAPS AREDS FORMULA PO) Take 1 capsule by mouth daily.      Marland Kitchen oxyCODONE (OXY IR/ROXICODONE) 5 MG immediate release tablet Take 5  mg by mouth 3 (three) times daily as needed. For pain      . polyvinyl alcohol (LIQUIFILM TEARS) 1.4 % ophthalmic solution Place 1 drop into both eyes daily as needed.      . trandolapril (MAVIK) 4 MG tablet Take 4 mg by mouth daily.       Marland Kitchen warfarin (COUMADIN) 3 MG tablet Take 3-4.5 mg by mouth daily. Take 3mg  (1 tablet) on Monday, Wedneday, and Friday. Take 4.5mg  (1 tablets) on all other days.       Scheduled:    . albuterol  2.5 mg Nebulization Q6H  . carvedilol  6.25 mg Oral BID WC  . diltiazem  240 mg Oral Daily  . furosemide  20 mg Intravenous Q6H  . ipratropium  0.5 mg Nebulization Q6H  . isosorbide mononitrate  15 mg Oral Daily  . levothyroxine  37.5 mcg Oral Daily  . levothyroxine  50 mcg Oral Daily  . sodium chloride  3 mL Intravenous Q12H  . trandolapril  4 mg Oral Daily    Assessment: 78 yr old female on coumadin.  Patient already took dose today.   Goal of Therapy:  INR 2-3   Plan:  No coumadin tonight, patient already took dose today.  Daily PT/INR.    Marylouise Stacks 10/28/2011,11:56 PM

## 2011-10-28 NOTE — ED Provider Notes (Signed)
History     CSN: [id number]  Arrival date & time 10/28/11  1441   First MD Initiated Contact with Patient 10/28/11 1550      Chief Complaint  Patient presents with  . Shortness of Breath    (Consider location/radiation/quality/duration/timing/severity/associated sxs/prior treatment) HPI Patient presents with complaint of generalized weakness, shortness of breath. She states that her shortness of breath has been worsening over the past approximately one week. She was seen at her primary care doctor's office today and was noted to have oxygen saturation of 86% on room air. She was also found to be anemic during her doctor's visit. She denies any chest pain. She denies fainting. She denies any fevers or chills. Her cough is nonproductive. She's had some increase in her leg swelling bilaterally over the past several days. She denies any blood in her stool or melena. Symptoms have been continuous but are worse with minimal exertion and has been worsening over the past several days. There no other alleviating or modifying factors. There no other associated systemic symptoms.  Past Medical History  Diagnosis Date  . Arthritis   . Chronic atrial fibrillation       chronic coumadin anticoagulation  . Hypertension   . Thyroid disease     hypothyroidism  . Esophageal reflux     with paraesophageal hernia  . Diverticulosis   . Hx of breast cancer     s/p lumpectomy   . Spinal stenosis     with chronic LBP  . Anemia   . Vitamin d deficiency   . Depression   . Osteoporosis   . UTI (lower urinary tract infection)   . Tachy-brady syndrome   . Chest pain   . Varicose veins   . Fatigue   . History of RIND (reversible ischemic neurological deficit)   . Pacemaker     medtronic    Past Surgical History  Procedure Date  . Tonsillectomy   . Appendectomy   . Tumor removal     ovary  . Breast surgery     rt- lupectomy  . Eye surgery     cataract bilateral    Family History  Problem  Relation Age of Onset  . Cancer Sister     History  Substance Use Topics  . Smoking status: Never Smoker   . Smokeless tobacco: Never Used  . Alcohol Use: No    OB History    Grav Para Term Preterm Abortions TAB SAB Ect Mult Living                  Review of Systems ROS reviewed and otherwise negative except for mentioned in HPI  Allergies  Amiodarone; Beet; Benzyl alc-guai-pse; Caffeine; Chocolate; Codeine phosphate; Darvon; Dilaudid; Dimetapp cold-allergy; Dust mite extract; Estrogens; Fungizone; Iodine; Librax; Micardis; Milk; Mold extract; Motrin; Penicillins; Petroleum jelly; Pineapple; Plavix; Prednisone; Soap; Sulfamethoxazole; Tagamet; Talwin; Tape; Watermelon concentrate; Zithromax; Dyazide; and Erythromycin  Home Medications   No current outpatient prescriptions on file.  BP 129/77  Pulse 67  Temp(Src) 98.2 F (36.8 C) (Oral)  Resp 15  Ht 5\' 4"  (1.626 m)  Wt 175 lb 14.8 oz (79.8 kg)  BMI 30.20 kg/m2  SpO2 94% Vitals reviewed Physical Exam Physical Examination: General appearance - alert, well appearing, and in no distress Eyes - pupils equal and reactive, no scleral icterus, no conjunctival injection, some conjunctival pallor Mouth - mucous membranes moist, pharynx normal without lesions Chest - clear to auscultation, no wheezes, rales or rhonchi,  symmetric air entry, mild tachypnea Heart - normal rate, regular rhythm, normal S1, S2, no murmurs, rubs, clicks or gallops Abdomen - soft, nontender, nondistended, no masses or organomegaly Rectal - normal rectal, no masses, hemocult negative Neurological - alert, oriented, normal speech, strength 5/5 in extremities x 4, sensation intact Musculoskeletal - no joint tenderness, deformity, FROM, brace on right knee Extremities - peripheral pulses normal, 2+ bilateral pitting edema, no clubbing or cyanosis Skin - normal coloration and turgor, no rashes  ED Course  Procedures (including critical care time) 4:17 PM  Went twice to see patient in assigned room, she is not in room   Date: 10/28/2011  Rate: 65  Rhythm: paced rhythm  QRS Axis: indeterminate  Intervals: indeterminate  ST/T Wave abnormalities: indeterminate  Conduction Disutrbances:paced rhythm  Narrative Interpretation:   Old EKG Reviewed: unchanged    7:32 PM d/w [facility] hospitalist, Dr. Kaylyn Layer for admission.  He requests tele bed, Team 4  Labs Reviewed  CBC - Abnormal; Notable for the following:    RBC 3.31 (*)    Hemoglobin 8.1 (*)    HCT 27.2 (*)    MCH 24.5 (*)    MCHC 29.8 (*)    RDW 15.7 (*)    All other components within normal limits  COMPREHENSIVE METABOLIC PANEL - Abnormal; Notable for the following:    Glucose, Bld 112 (*)    Albumin 3.3 (*)    GFR calc non Af Amer 52 (*)    GFR calc Af Amer 61 (*)    All other components within normal limits  PRO B NATRIURETIC PEPTIDE - Abnormal; Notable for the following:    Pro B Natriuretic peptide (BNP) 2930.0 (*)    All other components within normal limits  PROTIME-INR - Abnormal; Notable for the following:    Prothrombin Time 32.9 (*)    INR 3.16 (*)    All other components within normal limits  D-DIMER, QUANTITATIVE  POCT I-STAT TROPONIN I  TYPE AND SCREEN  PREPARE RBC (CROSSMATCH)  OCCULT BLOOD, POC DEVICE  ABO/RH  BASIC METABOLIC PANEL  APTT  CARDIAC PANEL(CRET KIN+CKTOT+MB+TROPI)  PROTIME-INR   Dg Chest 2 View  10/28/2011  *RADIOLOGY REPORT*  Clinical Data: Shortness of breath, history atrial fibrillation, pacemaker placement, hypertension, breast cancer  CHEST - 2 VIEW  Comparison: 10/22/2011  Findings: Left subclavian sequential transvenous pacemaker leads project at right atrium and right ventricle. Significant enlargement of cardiac silhouette. Pulmonary vascular congestion. Enlarged central pulmonary arteries question pulmonary arterial hypertension. Marked chronic elevation of right diaphragm. Chronic bronchitic changes right basilar atelectasis. No definite  acute failure or consolidation. Calcified granuloma medial right lung base. Osseous demineralization with thoracolumbar kyphosis and scoliosis as well as chronic compression deformity of the thoracolumbar vertebrae.  IMPRESSION: Enlargement of cardiac silhouette with pulmonary venous hypertension and suspect pulmonary arterial hypertension. Pacemaker. Chronic elevation right diaphragm with chronic bronchitic changes and right basilar atelectasis. No acute abnormalities.  Original Report Authenticated By: Lollie Marrow, M.D.     1. Anemia   2. Shortness of breath       MDM  Patient presents with complaint of shortness of breath and generalized weakness. She was found to be anemic which is largely unchanged from prior however she was found to be hypoxic at her primary doctor's office earlier today. She is short of breath with minimal exertion. So I think she is appropriate for blood transfusion 2/2 symptomatic anemia. Patient was consented for blood transfusion and long discussion was held with  patient and her caregiver and air in agreement with the plan for admission and blood transfusion. Patient admitted to try hospitalist for further management.        Ethelda Chick, MD 10/29/11 [phone]

## 2011-10-28 NOTE — H&P (Addendum)
PCP:   Sherryl Manges, MD, MD cardiologist Marinda Elk is PCP  Chief Complaint:  Dyspnea  HPI: [phone] with h/o chronic AFib/Coumadin, hypothyroidism, spinal stenosis and  chronic LBP, tachy-brady syndrome s/p PPM sent to ED by PCP for hypoxia.   The patient was seen in Dr. Odessa Fleming office 1/29 with plan to change the  generator on her pacemaker bc it's reached the end of life. However she is  also reporting that for the past 4-5d she's been more breathless, dyspneic,  fatigued with minimal exertion, and her friend notes she can barely get  through a sentence without having to stop for breath. She also notes increased  BLE swelling over same time duration. She denies any orthopneic symptoms. She  denies any chest pain at all.   She was seen in the ED 2/8 but sent home. Saw Dr. Foy Guadalajara today and noted to be  86% on RA, so sent to the ED.   In the ED, vitals were stable, no hypoxia noted, but pt noted to be very  fatigued with exertion. Labs with normal chem panel including renal fxn,  normal LFT's. CBC with stable anemia to 27. BNP 2930, Trop POC negative,  Ddimer negative. INR was 3.16. Fecal occult blood negative. CXR called as  significant cardiomegaly with pulmonary vascular congestion, enlarged central  pulmonary arteries (? PA HTN), marked chronic elevation of R hemidiaphragm and  chronic bronchitic changes, PPM in place, but no definite acute abnmls. Heme  negative rectal exam.   Past Medical History  Diagnosis Date  . Arthritis   . Chronic atrial fibrillation       chronic coumadin anticoagulation  . Hypertension   . Thyroid disease     hypothyroidism  . Esophageal reflux     with paraesophageal hernia  . Diverticulosis   . Hx of breast cancer     s/p lumpectomy   . Spinal stenosis     with chronic LBP  . Anemia   . Vitamin d deficiency   . Depression   . Osteoporosis   . UTI (lower urinary tract infection)   . Tachy-brady syndrome   . Chest pain   . Varicose  veins   . Fatigue   . History of RIND (reversible ischemic neurological deficit)   . Pacemaker     medtronic    Past Surgical History  Procedure Date  . Tonsillectomy   . Appendectomy   . Tumor removal     ovary  . Breast surgery     rt- lupectomy  . Eye surgery     cataract bilateral   Medications:  HOME MEDS: Reconciled by name, moderately well  Prior to Admission medications   Medication Sig Start Date End Date Taking? Authorizing Provider  cholecalciferol (VITAMIN D) 400 UNITS TABS Take 400 Units by mouth daily.   Yes Historical Provider, MD  diclofenac sodium (VOLTAREN) 1 % GEL Place 1 application onto the skin 2 (two) times daily as needed. For knee pain   Yes Historical Provider, MD  diltiazem (CARDIZEM CD) 240 MG 24 hr capsule Take 240 mg by mouth daily.     Yes Historical Provider, MD  Hypromellose (GENTEAL) 0.3 % SOLN Place 1 drop into both eyes at bedtime.    Yes Historical Provider, MD  levothyroxine (SYNTHROID, LEVOTHROID) 75 MCG tablet Take 37.5 mcg by mouth daily.   Yes Historical Provider, MD  Multiple Vitamins-Minerals (ICAPS AREDS FORMULA PO) Take 1 capsule by mouth daily.   Yes Historical Provider,  MD  oxyCODONE (OXY IR/ROXICODONE) 5 MG immediate release tablet Take 5 mg by mouth 3 (three) times daily as needed. For pain   Yes Historical Provider, MD  polyvinyl alcohol (LIQUIFILM TEARS) 1.4 % ophthalmic solution Place 1 drop into both eyes daily as needed.   Yes Historical Provider, MD  trandolapril (MAVIK) 4 MG tablet Take 4 mg by mouth daily.    Yes Historical Provider, MD  warfarin (COUMADIN) 3 MG tablet Take 3-4.5 mg by mouth daily. Take 3mg  (1 tablet) on Monday, Wedneday, and Friday. Take 4.5mg  (1 tablets) on all other days.   Yes Historical Provider, MD    Allergies:  Allergies  Allergen Reactions  . Amiodarone Other (See Comments)    dizziness  . Beet Other (See Comments)    unknown  . Benzyl Alc-Guai-Pse Other (See Comments)    unknown  .  Caffeine Other (See Comments)    unknown  . Chocolate Other (See Comments)    unknown  . Codeine Phosphate Other (See Comments)    unknown  . Darvon Other (See Comments)    unknown  . Dilaudid (Hydromorphone Hcl) Other (See Comments)    unknown  . Dimetapp Cold-Allergy Other (See Comments)    unknown  . Dust Mite Extract Other (See Comments)    unknown  . Estrogens Other (See Comments)    unknown  . Fungizone (Amphotericin B) Other (See Comments)    unknown  . Iodine Other (See Comments)    unknown  . Librax Other (See Comments)    unknown  . Micardis (Telmisartan) Other (See Comments)    Fatigue and back pain  . Milk (Dairy) Other (See Comments)    unknown  . Mold Extract (Trichophyton Mentagrophyte) Other (See Comments)    unknown  . Motrin (Ibuprofen) Other (See Comments)    unknown  . Penicillins Other (See Comments)    unknown  . Petroleum Jelly (Petrolatum) Other (See Comments)    unknown  . Pineapple Other (See Comments)    unknown  . Plavix (Clopidogrel Bisulfate) Other (See Comments)    dizziness  . Prednisone Other (See Comments)    unknown  . Soap Other (See Comments)    unknown  . Sulfamethoxazole Other (See Comments)    unknown  . Tagamet (Cimetidine) Other (See Comments)    unknown  . Talwin Other (See Comments)    unknown  . Tape Other (See Comments)    unknown  . Watermelon Concentrate Other (See Comments)    unknown  . Zithromax (Azithromycin Dihydrate) Other (See Comments)    unknown  . Dyazide (Hydrochlorothiazide W/Triamterene) Rash  . Erythromycin Other (See Comments)    unknown    Social History:   reports that she has never smoked. She has never used smokeless tobacco. She reports that she does not drink alcohol or use illicit drugs.  Family History: Family History  Problem Relation Age of Onset  . Cancer Sister     Physical Exam: Filed Vitals:   10/28/11 1800 10/28/11 1830 10/28/11 1930 10/28/11 2110  BP: 130/61 138/67  123/77 129/77  Pulse: 65 65 80 67  Temp:    98.2 F (36.8 C)  TempSrc:    Oral  Resp: 17 19 15 15   Height:    5\' 4"  (1.626 m)  Weight:    79.8 kg (175 lb 14.8 oz)  SpO2: 94% 97% 94%    Blood pressure 129/77, pulse 67, temperature 98.2 F (36.8 C), temperature source Oral, resp. rate  15, height 5\' 4"  (1.626 m), weight 79.8 kg (175 lb 14.8 oz), SpO2 94.00%.  Gen: Elderly but not frail appearing F who appears breathless with half a  sentence, some minimal audible wheezing. No distress though, not using  accessory muscles, no increased WOB.  HEENT: Pupils round, reactive, normal appearing, EOMI, sclera clear, mouth  moist, normal appearing.  Lungs: Actually fairly clear. Left base with transient wheeze, very light  rales heard, but otherwise not very bad sounding, with fairly good air  movement Heart: Regular, not tachycardic, with either a physiologically splitting of S2  heard at LUSB vs an S2, a bit hard to tell. No murmurs Abd: Soft, non tender, minimally distended, no facial grimacing.  Extrem: Warm throughout, not cool or cyanotic. Radials palpable. BLE's with  minimal soft pitting edema up to 1/3 of the shin, with scattered brown  hyperpigmented macules going up to knees.  Neuro: Alert, attentive, CN 2-12 intact without droop or slurred speech, moves  extremities on her own, sits up in bed when asked with minimal assistance.  Grossly non-focal   Labs & Imaging Results for orders placed during the hospital encounter of 10/28/11 (from the past 48 hour(s))  CBC     Status: Abnormal   Collection Time   10/28/11  4:34 PM      Component Value Range Comment   WBC 4.1  4.0 - 10.5 (K/uL)    RBC 3.31 (*) 3.87 - 5.11 (MIL/uL)    Hemoglobin 8.1 (*) 12.0 - 15.0 (g/dL)    HCT 45.4 (*) 09.8 - 46.0 (%)    MCV 82.2  78.0 - 100.0 (fL)    MCH 24.5 (*) 26.0 - 34.0 (pg)    MCHC 29.8 (*) 30.0 - 36.0 (g/dL)    RDW 11.9 (*) 14.7 - 15.5 (%)    Platelets 217  150 - 400 (K/uL)   COMPREHENSIVE  METABOLIC PANEL     Status: Abnormal   Collection Time   10/28/11  4:34 PM      Component Value Range Comment   Sodium 137  135 - 145 (mEq/L)    Potassium 4.0  3.5 - 5.1 (mEq/L)    Chloride 103  96 - 112 (mEq/L)    CO2 26  19 - 32 (mEq/L)    Glucose, Bld 112 (*) 70 - 99 (mg/dL)    BUN 16  6 - 23 (mg/dL)    Creatinine, Ser 8.29  0.50 - 1.10 (mg/dL)    Calcium 8.9  8.4 - 10.5 (mg/dL)    Total Protein 6.4  6.0 - 8.3 (g/dL)    Albumin 3.3 (*) 3.5 - 5.2 (g/dL)    AST 22  0 - 37 (U/L)    ALT 14  0 - 35 (U/L)    Alkaline Phosphatase 90  39 - 117 (U/L)    Total Bilirubin 0.3  0.3 - 1.2 (mg/dL)    GFR calc non Af Amer 52 (*) >90 (mL/min)    GFR calc Af Amer 61 (*) >90 (mL/min)   D-DIMER, QUANTITATIVE     Status: Normal   Collection Time   10/28/11  4:34 PM      Component Value Range Comment   D-Dimer, Quant 0.46  0.00 - 0.48 (ug/mL-FEU)   PRO B NATRIURETIC PEPTIDE     Status: Abnormal   Collection Time   10/28/11  4:34 PM      Component Value Range Comment   Pro B Natriuretic peptide (BNP) 2930.0 (*) 0 -  450 (pg/mL)   POCT I-STAT TROPONIN I     Status: Normal   Collection Time   10/28/11  4:54 PM      Component Value Range Comment   Troponin i, poc 0.01  0.00 - 0.08 (ng/mL)    Comment 3            PROTIME-INR     Status: Abnormal   Collection Time   10/28/11  6:46 PM      Component Value Range Comment   Prothrombin Time 32.9 (*) 11.6 - 15.2 (seconds)    INR 3.16 (*) 0.00 - 1.49    TYPE AND SCREEN     Status: Normal (Preliminary result)   Collection Time   10/28/11  6:52 PM      Component Value Range Comment   ABO/RH(D) A NEG      Antibody Screen NEG      Sample Expiration 10/31/2011      Unit Number [id number]      Blood Component Type RED CELLS,LR      Unit division 00      Status of Unit ALLOCATED      Transfusion Status OK TO TRANSFUSE      Crossmatch Result Compatible      Unit Number [id number]      Blood Component Type RED CELLS,LR      Unit division 00      Status of  Unit ALLOCATED      Transfusion Status OK TO TRANSFUSE      Crossmatch Result Compatible     PREPARE RBC (CROSSMATCH)     Status: Normal   Collection Time   10/28/11  6:52 PM      Component Value Range Comment   Order Confirmation ORDER PROCESSED BY BLOOD BANK     ABO/RH     Status: Normal   Collection Time   10/28/11  6:52 PM      Component Value Range Comment   ABO/RH(D) A NEG     OCCULT BLOOD, POC DEVICE     Status: Normal   Collection Time   10/28/11  6:53 PM      Component Value Range Comment   Fecal Occult Bld NEGATIVE      Dg Chest 2 View  10/28/2011  *RADIOLOGY REPORT*  Clinical Data: Shortness of breath, history atrial fibrillation, pacemaker placement, hypertension, breast cancer  CHEST - 2 VIEW  Comparison: 10/22/2011  Findings: Left subclavian sequential transvenous pacemaker leads project at right atrium and right ventricle. Significant enlargement of cardiac silhouette. Pulmonary vascular congestion. Enlarged central pulmonary arteries question pulmonary arterial hypertension. Marked chronic elevation of right diaphragm. Chronic bronchitic changes right basilar atelectasis. No definite acute failure or consolidation. Calcified granuloma medial right lung base. Osseous demineralization with thoracolumbar kyphosis and scoliosis as well as chronic compression deformity of the thoracolumbar vertebrae.  IMPRESSION: Enlargement of cardiac silhouette with pulmonary venous hypertension and suspect pulmonary arterial hypertension. Pacemaker. Chronic elevation right diaphragm with chronic bronchitic changes and right basilar atelectasis. No acute abnormalities.  Original Report Authenticated By: Lollie Marrow, M.D.   ECG: Vpacing without any apparent atrial activity   11/2010 Study Conclusions - Left ventricle: The cavity size was normal. Wall thickness was   increased in a pattern of mild LVH. Systolic function was normal.   The estimated ejection fraction was in the range of 55% to  60%.   Wall motion was normal; there were no regional wall motion   abnormalities. - Aortic valve:  Trivial regurgitation. - Mitral valve: Calcified annulus. Mild regurgitation. - Left atrium: The atrium was moderately dilated. - Right atrium: The atrium was mildly dilated.  Impression Present on Admission:  .Chronic atrial fibrillation .Dyspnea .Diastolic HF (heart failure) .Hypoxia  89yoF with h/o chronic AFib/Coumadin, hypothyroidism, spinal stenosis and  chronic LBP, tachy-brady syndrome s/p PPM sent to ED by PCP for hypoxia.   1. Hypoxia: This is most likely due to pulmonary HTN and decreased lung  volumes with chronic R hemidiaphragmatic elevation. This has been noted  previously on CXR's going back at least to 2011 from my brief review, but now  probably more symptomatic. Suspect overall due to diastolic HF, as last echo  11/2010 showed LVH and normal EF, and pt without ischemic history that I can  see.   - Admit HF protocol  - Start with 2d echo. INR >3 makes ACS less likely but will screen with  cardiac enzymes x1. Diuresis with IV Lasix 20 mg IV q6 x4, MD titrate  thereafter (sulfa allergy noted, just monitor for now). Nebulizers - IV Nitroglycerin considered, but states it gave her severe headaches and  doesn't want any, so will diurese as above. Will give oral Imdur to see if  this helps.  - Continue home diltiazem, trandolapril. Start Coreg while admitted   2. AFib s/p PPM: Appears to be in underlying Afib without any P waves noted,  only V pacing at 65 bpm.   - Pt supposed to have PPM generator change on Monday, but now admitted. Would  call Dr. Odessa Fleming office in the am to give them the message that pt is admitted  in likely HF.  - Continue home Diltiazem. Unclear but apparently Atenolol was stopped  recently? Continue home coumadin for now, but would ask Dr. Graciela Husbands in the am if  this should be stopped -- not clear by the notes.   3. Hypothyroidism: continue  home levothyroxine  Telemetry, [facility] team 4 Full code, discussed   Other plans as per orders.  Shantale Holtmeyer 10/28/2011, 10:50 PM   Addendum: Pt was ordered for blood in the ED thinking that anemia was the etiology however pt's Hct has been stable for at least a year, and doubt this is etiology of her dyspnea. In fact, blood transfusion would likely worsen her pulmonary HTN, so will NOT order this -- unless they prophylactically wanted one for the procedure Monday.

## 2011-10-28 NOTE — ED Notes (Signed)
Contact person for pt, Derwood Kaplan [phone], [phone]

## 2011-10-28 NOTE — ED Notes (Signed)
Patient transported to X-ray 

## 2011-10-28 NOTE — ED Notes (Signed)
Obtained consent for blood transfusion, witnessed by this RN and Dr Karma Ganja

## 2011-10-28 NOTE — ED Notes (Signed)
Pt presents with worsening shortness of breath x 1 week.  Pt denies any cough, or chest discomfort.  Pt reports she is scheduled for placemaker placement on Monday.  +BLE edema

## 2011-10-29 ENCOUNTER — Encounter (HOSPITAL_COMMUNITY): Payer: Self-pay | Admitting: *Deleted

## 2011-10-29 DIAGNOSIS — R5381 Other malaise: Secondary | ICD-10-CM

## 2011-10-29 DIAGNOSIS — R001 Bradycardia, unspecified: Secondary | ICD-10-CM

## 2011-10-29 DIAGNOSIS — R0989 Other specified symptoms and signs involving the circulatory and respiratory systems: Secondary | ICD-10-CM

## 2011-10-29 LAB — BASIC METABOLIC PANEL
Calcium: 9.3 mg/dL (ref 8.4–10.5)
Creatinine, Ser: 0.92 mg/dL (ref 0.50–1.10)
GFR calc Af Amer: 62 mL/min — ABNORMAL LOW (ref >90)

## 2011-10-29 LAB — CARDIAC PANEL(CRET KIN+CKTOT+MB+TROPI): Relative Index: 2.8 — ABNORMAL HIGH (ref 0.0–2.5)

## 2011-10-29 LAB — APTT: aPTT: 35 seconds (ref 24–37)

## 2011-10-29 MED ORDER — WARFARIN SODIUM 3 MG PO TABS
3.0000 mg | ORAL_TABLET | Freq: Once | ORAL | Status: AC
  Administered 2011-10-29: 3 mg via ORAL
  Filled 2011-10-29 (×2): qty 1

## 2011-10-29 MED ORDER — POTASSIUM CHLORIDE CRYS ER 20 MEQ PO TBCR
40.0000 meq | EXTENDED_RELEASE_TABLET | Freq: Two times a day (BID) | ORAL | Status: AC
  Administered 2011-10-29 (×2): 40 meq via ORAL
  Filled 2011-10-29 (×2): qty 2

## 2011-10-29 MED ORDER — BIOTENE DRY MOUTH MT LIQD
15.0000 mL | Freq: Two times a day (BID) | OROMUCOSAL | Status: DC
  Administered 2011-10-29 – 2011-11-02 (×11): 15 mL via OROMUCOSAL
  Filled 2011-10-29: qty 15

## 2011-10-29 NOTE — Telephone Encounter (Signed)
Tried to call back, no answer.  Unable to leave message, no machine picked up

## 2011-10-29 NOTE — Telephone Encounter (Signed)
I will route this message to Sherri Rad RN to make her aware that the pt is currently in the hospital.

## 2011-10-29 NOTE — Progress Notes (Signed)
ANTICOAGULATION CONSULT NOTE - Follow Up Consult  Pharmacy Consult for Warfarin  Indication: atrial fibrillation   Patient Measurements: Height: 5\' 4"  (162.6 cm) Weight: 175 lb 14.8 oz (79.8 kg) IBW/kg (Calculated) : 54.7   Vital Signs: Temp: 98.9 F (37.2 C) (02/15 0500) BP: 150/80 mmHg (02/15 0500) Pulse Rate: 65  (02/15 0500)  Labs:  Basename 10/29/11 0630 10/28/11 1846 10/28/11 1634 10/26/11 1241  HGB -- -- 8.1* 8.1 Repeated and verified X2.*  HCT -- -- 27.2* 26.4*  PLT -- -- 217 227.0  APTT 35 -- -- --  LABPROT 30.6* 32.9* -- 36.3*  INR 2.88* 3.16* -- 3.3*  HEPARINUNFRC -- -- -- --  CREATININE 0.92 -- 0.94 1.1  CKTOTAL 311* -- -- --  CKMB 8.6* -- -- --  TROPONINI <0.30 -- -- --   Estimated Creatinine Clearance: 42.3 ml/min (by C-G formula based on Cr of 0.92).   Medications:  Scheduled:    . albuterol  2.5 mg Nebulization Q6H  . antiseptic oral rinse  15 mL Mouth Rinse BID  . carvedilol  6.25 mg Oral BID WC  . diltiazem  240 mg Oral Daily  . furosemide  20 mg Intravenous Q6H  . ipratropium  0.5 mg Nebulization Q6H  . isosorbide mononitrate  15 mg Oral Daily  . levothyroxine  37.5 mcg Oral QAC breakfast  . potassium chloride  40 mEq Oral BID  . trandolapril  4 mg Oral Daily  . DISCONTD: levothyroxine  50 mcg Oral Daily  . DISCONTD: sodium chloride  3 mL Intravenous Q12H    Assessment: 78 yo female with chronic afib on coumadin PTA and INR at goal at 2.88 (last two INRs 3.16 and 3.3). Home dose 3mg  MWF and 4.5mg  STTS  Goal of Therapy:  INR 2-3   Plan:  -Will give coumadin 3mg  today and watch INR trend cloasely   [name] 10/29/2011,10:58 AM

## 2011-10-29 NOTE — Progress Notes (Signed)
Subjective: She feels that she is breathing better this morning than last night. Denies chest pain.  Objective: Filed Vitals:   10/28/11 1930 10/28/11 2110 10/29/11 0115 10/29/11 0500  BP: 123/77 129/77  150/80  Pulse: 80 67  65  Temp:  98.2 F (36.8 C)  98.9 F (37.2 C)  TempSrc:  Oral    Resp: 15 15  18   Height:  5\' 4"  (1.626 m)    Weight:  79.8 kg (175 lb 14.8 oz)    SpO2: 94%  94% 97%   Weight change:   Intake/Output Summary (Last 24 hours) at 10/29/11 0856 Last data filed at 10/29/11 0700  Gross per 24 hour  Intake     64 ml  Output   2550 ml  Net  -2486 ml    General: Alert, awake, oriented x3, in no acute distress.  HEENT: No bruits, no goiter.  Heart: Regular rate and rhythm, without murmurs, rubs, gallops.  Lungs: Crackles bl, bilateral air movement.  Abdomen: Soft, nontender, nondistended, positive bowel sounds.  Neuro: Grossly intact, nonfocal. Extremities; 2 edema.   Lab Results:  [facility] 10/29/11 0630 10/28/11 1634  NA 139 137  K 3.4* 4.0  CL 100 103  CO2 27 26  GLUCOSE 102* 112*  BUN 14 16  CREATININE 0.92 0.94  CALCIUM 9.3 8.9  MG -- --  PHOS -- --    Basename 10/28/11 1634  AST 22  ALT 14  ALKPHOS 90  BILITOT 0.3  PROT 6.4  ALBUMIN 3.3*    Basename 10/28/11 1634 10/26/11 1241  WBC 4.1 4.4*  NEUTROABS -- 2.8  HGB 8.1* 8.1 Repeated and verified X2.*  HCT 27.2* 26.4*  MCV 82.2 80.6  PLT 217 227.0    Basename 10/29/11 0630  CKTOTAL 311*  CKMB 8.6*  CKMBINDEX --  TROPONINI <0.30   Basename 10/28/11 1634  DDIMER 0.46    Studies/Results: Dg Chest 2 View  10/28/2011  *RADIOLOGY REPORT*  Clinical Data: Shortness of breath, history atrial fibrillation, pacemaker placement, hypertension, breast cancer  CHEST - 2 VIEW  Comparison: 10/22/2011  Findings: Left subclavian sequential transvenous pacemaker leads project at right atrium and right ventricle. Significant enlargement of cardiac silhouette. Pulmonary vascular congestion.  Enlarged central pulmonary arteries question pulmonary arterial hypertension. Marked chronic elevation of right diaphragm. Chronic bronchitic changes right basilar atelectasis. No definite acute failure or consolidation. Calcified granuloma medial right lung base. Osseous demineralization with thoracolumbar kyphosis and scoliosis as well as chronic compression deformity of the thoracolumbar vertebrae.  IMPRESSION: Enlargement of cardiac silhouette with pulmonary venous hypertension and suspect pulmonary arterial hypertension. Pacemaker. Chronic elevation right diaphragm with chronic bronchitic changes and right basilar atelectasis. No acute abnormalities.  Original Report Authenticated By: Lollie Marrow, M.D.    Medications: I have reviewed the patient's current medications.   Patient Active Hospital Problem List:  Acute Diastolic HF exacerbation:  Patient presented with hypoxemia, dyspnea, has mild increase BNP, chest x ray with vascular congestion , pulmonary hypertension. She has received 2 doses of lasix. Have two more  Doses  for today. She is breathing better. Continue with Imdur, Coreg. ECHO ordered.   Chronic atrial fibrillation  Continue with Coreg and Cardizem, Coumadin. INR 2.8.   Hypoxic respiratory failure: Likely secondary to Pulmonary HTN, Diastolic HF exacerbation. Patient will need evaluation for home oxygen requirement. Continue with albuterol and ipratropium.Marland Kitchen   Hypothyroidism,: Continue with synthroid.  Tachy-brady syndrome; Patient is supposed to have exchange of  Pacemaker generator. Cardio consulted.  Anemia; HB at baseline. Check anemia panel.  Hypokalemia; Will give 40 meq po BID time two doses, now patient on lasix.    LOS: 1 day   Jaide Hillenburg M.D.  [facility] 10/29/2011, 8:56 AM

## 2011-10-29 NOTE — Telephone Encounter (Signed)
FU Call: Pt caregiver returning call to our office to inform nurse/MD that pt was admitted into the hospital yesterday. Pt was suppose to call our office and speak with nurse about pt stopping taking some of her medications. Please return pt call to discuss further.

## 2011-10-29 NOTE — Consult Note (Signed)
CARDIOLOGY CONSULT NOTE  Patient ID: [name], MRN: [id number], DOB/AGE: April 17, 1920 78 y.o. Admit date: 10/28/2011 Date of Consult: 10/29/2011  Primary Physician: Marinda Elk Primary Cardiologist: Sherryl Manges, MD  Chief Complaint: Dyspnea and fatigue Reason for Consultation: Scheduled PPM generator change on Monday  HPI: 78 y.o. female w/ PMHx significant for permanent a.fib on coumadin, tachy/brady syndrome s/p [employer]tronic PPM '04, and hypothyroidism who was evaluated by her PCP for dyspnea and found to be hypoxic and sent to [facility] on 10/28/11.   Was seen in clinic by Dr. Graciela Husbands on 10/12/11 for a pacemaker followup at which time her PPM was found to be at [facility] and she was scheduled for a generator change on 11/01/11. Over the last 1-2wks she has had worsening sob, DOE, orthopnea and BLE edema. She denies chest pain or palpitations. She was seen by her PCP on day of presentation who noted her O2sats to be 86% on RA and was sent to the ED.   In the ED she was stable without documented hypoxia. Initial poc tropnin was negative as was DDimer. INR 3.16. CXR revealed enlarged cardiac silhouette with pulmonary vascular congestion and question of pulmonary htn. She was admitted by internal medicine for further evaluation and treatment.  Cardiology is asked to evaluate her as she was scheduled for a PPM generator change on Monday and will still be in the hospital. Her dyspnea is improving with diuretics. She is in a.fib w/ HRs 60s-80s on telemetry with demand V-Pacing.     Past Medical History  Diagnosis Date  . Arthritis   . Chronic atrial fibrillation       chronic coumadin anticoagulation  . Hypertension   . Thyroid disease     hypothyroidism  . Esophageal reflux     with paraesophageal hernia  . Diverticulosis   . Hx of breast cancer     s/p lumpectomy   . Spinal stenosis     with chronic LBP  . Anemia   . Vitamin d deficiency   . Osteoporosis   . UTI (lower  urinary tract infection)   . Tachy-brady syndrome   . Chest pain   . Varicose veins   . Fatigue   . History of RIND (reversible ischemic neurological deficit)   . Pacemaker     medtronic  . Hypothyroidism      11/2010 - 2D Echocardiogram Study Conclusions:  - Left ventricle: The cavity size was normal. Wall thickness was  increased in a pattern of mild LVH. Systolic function was normal.  The estimated ejection fraction was in the range of 55% to 60%.  Wall motion was normal; there were no regional wall motion abnormalities.  - Aortic valve: Trivial regurgitation.  - Mitral valve: Calcified annulus. Mild regurgitation.  - Left atrium: The atrium was moderately dilated.  - Right atrium: The atrium was mildly dilated.  Surgical History:  Past Surgical History  Procedure Date  . Tonsillectomy   . Appendectomy   . Tumor removal     ovary  . Breast surgery     rt- lupectomy  . Eye surgery     cataract bilateral  . Insert / replace / remove pacemaker      Home Meds: Medication Sig  cholecalciferol (VITAMIN D) 400 UNITS TABS Take 400 Units by mouth daily.  diclofenac sodium (VOLTAREN) 1 % GEL Place 1 application onto the skin 2 (two) times daily as needed. For knee pain  diltiazem (CARDIZEM CD) 240 MG  24 hr capsule Take 240 mg by mouth daily.    Hypromellose (GENTEAL) 0.3 % SOLN Place 1 drop into both eyes at bedtime.   levothyroxine (SYNTHROID, LEVOTHROID) 75 MCG tablet Take 37.5 mcg by mouth daily.  Multiple Vitamins-Minerals (ICAPS AREDS FORMULA PO) Take 1 capsule by mouth daily.  oxyCODONE (OXY IR/ROXICODONE) 5 MG immediate release tablet Take 5 mg by mouth 3 (three) times daily as needed. For pain  polyvinyl alcohol (LIQUIFILM TEARS) 1.4 % ophthalmic solution Place 1 drop into both eyes daily as needed.  trandolapril (MAVIK) 4 MG tablet Take 4 mg by mouth daily.   warfarin (COUMADIN) 3 MG tablet Take 3-4.5 mg by mouth daily. Take 3mg  (1 tablet) on Monday, Wedneday, and Friday.  Take 4.5mg  (1 tablets) on all other days.   Inpatient Medications:   . albuterol  2.5 mg Nebulization Q6H  . antiseptic oral rinse  15 mL Mouth Rinse BID  . carvedilol  6.25 mg Oral BID WC  . diltiazem  240 mg Oral Daily  . furosemide  20 mg Intravenous Q6H  . ipratropium  0.5 mg Nebulization Q6H  . isosorbide mononitrate  15 mg Oral Daily  . levothyroxine  37.5 mcg Oral QAC breakfast  . potassium chloride  40 mEq Oral BID  . trandolapril  4 mg Oral Daily  . warfarin  3 mg Oral ONCE-1800   Allergies:  Allergies  Allergen Reactions  . Amiodarone Other (See Comments)    dizziness  . Beet Other (See Comments)    unknown  . Benzyl Alc-Guai-Pse Other (See Comments)    unknown  . Caffeine Other (See Comments)    unknown  . Chocolate Other (See Comments)    unknown  . Codeine Phosphate Other (See Comments)    unknown  . Darvon Other (See Comments)    unknown  . Dilaudid (Hydromorphone Hcl) Other (See Comments)    unknown  . Dimetapp Cold-Allergy Other (See Comments)    unknown  . Dust Mite Extract Other (See Comments)    unknown  . Estrogens Other (See Comments)    unknown  . Fungizone (Amphotericin B) Other (See Comments)    unknown  . Iodine Other (See Comments)    unknown  . Librax Other (See Comments)    unknown  . Micardis (Telmisartan) Other (See Comments)    Fatigue and back pain  . Milk (Dairy) Other (See Comments)    unknown  . Mold Extract (Trichophyton Mentagrophyte) Other (See Comments)    unknown  . Motrin (Ibuprofen) Other (See Comments)    unknown  . Penicillins Other (See Comments)    unknown  . Petroleum Jelly (Petrolatum) Other (See Comments)    unknown  . Pineapple Other (See Comments)    unknown  . Plavix (Clopidogrel Bisulfate) Other (See Comments)    dizziness  . Prednisone Other (See Comments)    unknown  . Soap Other (See Comments)    unknown  . Sulfamethoxazole Other (See Comments)    unknown  . Tagamet (Cimetidine) Other (See  Comments)    unknown  . Talwin Other (See Comments)    unknown  . Tape Other (See Comments)    unknown  . Watermelon Concentrate Other (See Comments)    unknown  . Zithromax (Azithromycin Dihydrate) Other (See Comments)    unknown  . Dyazide (Hydrochlorothiazide W/Triamterene) Rash  . Erythromycin Other (See Comments)    unknown   Social History  . Marital Status: Married   Occupational History  .  Retired   Social History Main Topics  . Smoking status: Never Smoker   . Smokeless tobacco: Never Used  . Alcohol Use: No  . Drug Use: No  . Sexually Active: No   Social History Narrative   Contact person [name] [phone], [phone], she is a good friend. Husband passed away 06-28-2011 and pt moved into an apartment, now lives by herself in [place] [facility] [place] [place] [facility]. No kids. Not many friends or family support.      Family History  Problem Relation Age of Onset  . Cancer Sister      Review of Systems: General: negative for chills, fever, night sweats or weight changes.  Cardiovascular: (+) shortness of breath, dyspnea on exertion, edema, orthopnea;  negative for chest pain, palpitations, or paroxysmal nocturnal dyspnea Dermatological: negative for rash Respiratory: negative for cough or wheezing Urologic: negative for hematuria Abdominal: negative for nausea, vomiting, diarrhea, bright red blood per rectum, melena, or hematemesis Neurologic: negative for visual changes, syncope, or dizziness All other systems reviewed and are otherwise negative except as noted above.  Labs:  [facility] 10/29/11 0630  CKTOTAL 311*  CKMB 8.6*  TROPONINI <0.30   Component Value Date   WBC 4.1 10/28/2011   HGB 8.1* 10/28/2011   HCT 27.2* 10/28/2011   MCV 82.2 10/28/2011   PLT 217 10/28/2011    Lab 10/29/11 0630 10/28/11 1634  NA 139 --  K 3.4* --  CL 100 --  CO2 27 --  BUN 14 --  CREATININE 0.92 --  CALCIUM 9.3 --  PROT -- 6.4  BILITOT -- 0.3  ALKPHOS -- 90  ALT -- 14  AST -- 22    GLUCOSE 102* --   Component Value Date   DDIMER 0.46 10/28/2011     10/29/2011 06:30  Prothrombin Time 30.6 (H)  INR 2.88 (H)     10/28/2011 16:34  Pro B Natriuretic peptide (BNP) 2930.0 (H)    10/28/2011 18:53  Fecal Occult Blood, POC NEGATIVE   Radiology/Studies:   10/28/2011 - CXR Findings: Left subclavian sequential transvenous pacemaker leads project at right atrium and right ventricle. Significant enlargement of cardiac silhouette. Pulmonary vascular congestion. Enlarged central pulmonary arteries question pulmonary arterial hypertension. Marked chronic elevation of right diaphragm. Chronic bronchitic changes right basilar atelectasis. No definite acute failure or consolidation. Calcified granuloma medial right lung base. Osseous demineralization with thoracolumbar kyphosis and scoliosis as well as chronic compression deformity of the thoracolumbar vertebrae.  IMPRESSION: Enlargement of cardiac silhouette with pulmonary venous hypertension and suspect pulmonary arterial hypertension. Pacemaker. Chronic elevation right diaphragm with chronic bronchitic changes and right basilar atelectasis. No acute abnormalities.    EKG: 10/28/11 @ 2246 - V-Paced rhythm 65bpm Telemetry: A.fib 60s-80s w/ demand V-Pacing  Physical Exam: Blood pressure 150/80, pulse 65, temperature 98.9 F (37.2 C), temperature source Oral, resp. rate 18, height 5\' 4"  (1.626 m), weight 175 lb 14.8 oz (79.8 kg), SpO2 98.00%. General: Elderly white female in no acute distress. Head: Normocephalic, atraumatic, sclera non-icteric, no xanthomas, nares are without discharge.  Neck: Supple. Negative for carotid bruits. JVD elevated just below jaw. Lungs: Diminished throughout, Fine rales posteriorly to mid lungs, No wheezes or rhonchi. Breathing is unlabored.  Decreased BS at R base Heart: Irregularly irregular with S1 S2. No murmurs, rubs, or gallops appreciated. Abdomen: Soft, non-tender, non-distended with normoactive bowel  sounds. No hepatomegaly. No rebound/guarding. No obvious abdominal masses. Msk:  Strength and tone appear normal for age. Extremities: No clubbing or cyanosis. 1+ BLE edema. Large varicose  veins RLE. Distal pedal pulses are 2+ and equal bilaterally. Neuro: Alert and oriented X 3. Moves all extremities spontaneously. Psych:  Responds to questions appropriately with a normal affect.   Assessment and Plan:  KARINNA BEADLES is a 78 y.o. female with permanent atrial fibrillation and previously preserved EF.  She has a PPM implanted previously for tachy/brady.  She is not pacer dependant and he device is functioning in a reasonable manner at VVI 65 with intermittent depand V pacing seen on telemetry. She is now admitted with SOB of unclear etiology.  She is mildly volume overloaded on exam, though I suspect that there is also a component of chronic lung disease.  She has a repeat echo which is pending. I do not feel that her present pacemaker at [facility] has produced her SOB.  She is presently not bradycardic.  Recs: Gentle diuresis over the weekend Continue coumadin (goal INR 2-3) Hold Sunday PM coumadin dose only (ideally INR would be 2-2.5 for gen change). Do not give any lovenox or heparin.  Dr Graciela Husbands to reassess on Monday am.  If hypoxia improved, she will likely proceed with generator change at that time.   Fayrene Fearing Morris Markham,MD 10/29/2011 6:05 PM

## 2011-10-29 NOTE — Progress Notes (Signed)
Utilization review completed. [name] 10/29/2011 

## 2011-10-30 DIAGNOSIS — I4891 Unspecified atrial fibrillation: Secondary | ICD-10-CM

## 2011-10-30 DIAGNOSIS — I059 Rheumatic mitral valve disease, unspecified: Secondary | ICD-10-CM

## 2011-10-30 LAB — BASIC METABOLIC PANEL
BUN: 17 mg/dL (ref 6–23)
CO2: 31 mEq/L (ref 19–32)
Chloride: 101 mEq/L (ref 96–112)
Chloride: 100 mEq/L (ref 96–112)
GFR calc Af Amer: 64 mL/min — ABNORMAL LOW (ref >90)
Glucose, Bld: 106 mg/dL — ABNORMAL HIGH (ref 70–99)
Glucose, Bld: 177 mg/dL — ABNORMAL HIGH (ref 70–99)
Potassium: 3.7 mEq/L (ref 3.5–5.1)
Potassium: 3.9 mEq/L (ref 3.5–5.1)
Sodium: 141 mEq/L (ref 135–145)
Sodium: 139 mEq/L (ref 135–145)

## 2011-10-30 LAB — FOLATE: Folate: 16.9 ng/mL

## 2011-10-30 LAB — PROTIME-INR
INR: 2.54 — ABNORMAL HIGH (ref 0.00–1.49)
Prothrombin Time: 27.8 seconds — ABNORMAL HIGH (ref 11.6–15.2)

## 2011-10-30 LAB — CBC
Hemoglobin: 8.8 g/dL — ABNORMAL LOW (ref 12.0–15.0)
Platelets: 243 10*3/uL (ref 150–400)
RBC: 3.58 MIL/uL — ABNORMAL LOW (ref 3.87–5.11)
WBC: 4.1 10*3/uL (ref 4.0–10.5)

## 2011-10-30 LAB — IRON AND TIBC
Saturation Ratios: 5 % — ABNORMAL LOW (ref 20–55)
TIBC: 464 ug/dL (ref 250–470)

## 2011-10-30 LAB — VITAMIN B12: Vitamin B-12: 440 pg/mL (ref 211–911)

## 2011-10-30 MED ORDER — POTASSIUM CHLORIDE CRYS ER 20 MEQ PO TBCR
20.0000 meq | EXTENDED_RELEASE_TABLET | Freq: Every day | ORAL | Status: DC
  Administered 2011-10-30: 20 meq via ORAL
  Filled 2011-10-30 (×2): qty 1

## 2011-10-30 MED ORDER — FUROSEMIDE 10 MG/ML IJ SOLN
40.0000 mg | Freq: Every day | INTRAMUSCULAR | Status: DC
  Administered 2011-10-30: 40 mg via INTRAVENOUS
  Filled 2011-10-30 (×2): qty 4

## 2011-10-30 MED ORDER — ALBUTEROL SULFATE HFA 108 (90 BASE) MCG/ACT IN AERS
1.0000 | INHALATION_SPRAY | Freq: Four times a day (QID) | RESPIRATORY_TRACT | Status: DC
  Administered 2011-10-30 – 2011-10-31 (×2): 1 via RESPIRATORY_TRACT
  Filled 2011-10-30: qty 6.7

## 2011-10-30 MED ORDER — WARFARIN SODIUM 3 MG PO TABS
3.0000 mg | ORAL_TABLET | Freq: Once | ORAL | Status: AC
  Administered 2011-10-30: 3 mg via ORAL
  Filled 2011-10-30: qty 1

## 2011-10-30 NOTE — Progress Notes (Signed)
ANTICOAGULATION CONSULT NOTE - Follow Up Consult  Pharmacy Consult for Warfarin  Indication: atrial fibrillation   Patient Measurements: Height: 5\' 4"  (162.6 cm) Weight: 153 lb (69.4 kg) IBW/kg (Calculated) : 54.7   Vital Signs: Temp: 98.2 F (36.8 C) (02/16 1355) Temp src: Oral (02/16 1355) BP: 103/51 mmHg (02/16 1355) Pulse Rate: 90  (02/16 1355)  Labs:  Basename 10/30/11 0940 10/30/11 0620 10/29/11 0630 10/28/11 1846 10/28/11 1634  HGB -- 8.8* -- -- 8.1*  HCT -- 28.9* -- -- 27.2*  PLT -- 243 -- -- 217  APTT -- 32 35 -- --  LABPROT -- 27.8* 30.6* 32.9* --  INR -- 2.54* 2.88* 3.16* --  HEPARINUNFRC -- -- -- -- --  CREATININE 0.90 0.96 0.92 -- --  CKTOTAL -- -- 311* -- --  CKMB -- -- 8.6* -- --  TROPONINI -- -- <0.30 -- --   Estimated Creatinine Clearance: 40.5 ml/min (by C-G formula based on Cr of 0.9).   Medications:  Scheduled:     . albuterol  1 puff Inhalation Q6H  . antiseptic oral rinse  15 mL Mouth Rinse BID  . carvedilol  6.25 mg Oral BID WC  . diltiazem  240 mg Oral Daily  . furosemide  20 mg Intravenous Q6H  . furosemide  40 mg Intravenous Daily  . isosorbide mononitrate  15 mg Oral Daily  . levothyroxine  37.5 mcg Oral QAC breakfast  . potassium chloride  20 mEq Oral Daily  . potassium chloride  40 mEq Oral BID  . trandolapril  4 mg Oral Daily  . warfarin  3 mg Oral ONCE-1800  . warfarin  3 mg Oral ONCE-1800  . DISCONTD: albuterol  2.5 mg Nebulization Q6H  . DISCONTD: ipratropium  0.5 mg Nebulization Q6H    Assessment: 78 yo female with chronic afib on coumadin PTA and INR at goal at 2.54 (trending down since admit). Home dose 3mg  MWF and 4.5mg  STTS.  No bleeding noted per chart notes.  Noted Dr. Earmon Phoenix plan to hold Coumadin tomorrow (2/17) for pacer gen change on Monday.  Will dose lower than home dose for tonight.  Goal of Therapy:  INR 2-3   Plan:  -Will repeat coumadin 3mg  today. - AM INR.  Sina Lucchesi C 10/30/2011,2:27  PM

## 2011-10-30 NOTE — Progress Notes (Signed)
    Subjective:  Feels better. No chest pain. Dyspnea is improved. She's sneezing a lot this AM. Slept better last night than she has in several days.  Objective:  Vital Signs in the last 24 hours: Temp:  [97.8 F (36.6 C)-98.1 F (36.7 C)] 98 F (36.7 C) (02/16 0431) Pulse Rate:  [68-82] 82  (02/16 0431) Resp:  [16-18] 16  (02/16 0431) BP: (120-154)/(68-84) 154/84 mmHg (02/16 0431) SpO2:  [92 %-98 %] 97 % (02/16 0431) Weight:  [69.4 kg (153 lb)] 69.4 kg (153 lb) (02/16 0500)  Intake/Output from previous day: 02/15 0701 - 02/16 0700 In: 360 [P.O.:360] Out: 1300 [Urine:1300]  Physical Exam: Pt is alert and oriented, NAD HEENT: normal Neck: JVP - normal, carotids 2+= without bruits Lungs: diminished bilateral bases CV: RRR without murmur or gallop Abd: soft, NT, Positive BS, no hepatomegaly Ext: no C/C/E, distal pulses intact and equal Skin: warm/dry no rash   Lab Results:  Basename 10/30/11 0620 10/28/11 1634  WBC 4.1 4.1  HGB 8.8* 8.1*  PLT 243 217    Basename 10/30/11 0940 10/30/11 0620  NA 139 141  K 3.9 3.7  CL 100 101  CO2 30 31  GLUCOSE 177* 106*  BUN 17 18  CREATININE 0.90 0.96    Basename 10/29/11 0630  TROPONINI <0.30   Assessment/Plan:  1. Permanent atrial fib 2. Pacemaker at [facility] needs generator change 3. Acute diastolic heart failure, improved with diuresis  Reviewed note of Dr Sunnie Nielsen, agree with lasix 40 mg IV today, potassium repletion. Hold coumadin tomorrow with plans for pacemaker gen change Monday. Will make NPO after midnight tomorrow night.  Tonny Bollman, M.D. 10/30/2011, 11:36 AM

## 2011-10-30 NOTE — Evaluation (Signed)
Physical Therapy Evaluation Patient Details Name: [name] MRN: [id number] DOB: 1920-08-27 Today's Date: 10/30/2011  Problem List:  Patient Active Problem List  Diagnoses  . Varicose veins of lower extremities with other complications  . Chronic venous insufficiency  . Pain in limb  . Sinoatrial node dysfunction  . Atrial fibrillation  . Pacemaker-Mdt dual  . Chronic atrial fibrillation  . Dyspnea  . Diastolic HF (heart failure)  . Hypoxia  . Bradycardia    Past Medical History:  Past Medical History  Diagnosis Date  . Arthritis   . Chronic atrial fibrillation       chronic coumadin anticoagulation  . Hypertension   . Thyroid disease     hypothyroidism  . Esophageal reflux     with paraesophageal hernia  . Diverticulosis   . Hx of breast cancer     s/p lumpectomy   . Spinal stenosis     with chronic LBP  . Anemia   . Vitamin d deficiency   . Osteoporosis   . UTI (lower urinary tract infection)   . Tachy-brady syndrome   . Chest pain   . Varicose veins   . Fatigue   . History of RIND (reversible ischemic neurological deficit)   . Pacemaker     medtronic  . Hypothyroidism    Past Surgical History:  Past Surgical History  Procedure Date  . Tonsillectomy   . Appendectomy   . Tumor removal     ovary  . Breast surgery     rt- lupectomy  . Eye surgery     cataract bilateral  . Insert / replace / remove pacemaker     PT Assessment/Plan/Recommendation PT Assessment Clinical Impression Statement: patient admitted for hypoxia secondary to pulmonary HTN; Heart failure and permanent Afib.   Patient excited to get OOB and participate in therapy.  Overall, patient did well with mobility, requiring minimum assistance or less.  Feel patient may need a few days in assisted living area of facility if able, or assistance in home for a few days.  Anticipate patient will steadily retrun to baseline. PT Recommendation/Assessment: Patient will need skilled PT in  the acute care venue PT Problem List: Decreased activity tolerance;Decreased mobility PT Therapy Diagnosis : Generalized weakness PT Plan PT Frequency: Min 3X/week PT Treatment/Interventions: Gait training;Functional mobility training;Therapeutic activities PT Recommendation Follow Up Recommendations: Home health PT;Supervision - Intermittent Equipment Recommended: None recommended by PT PT Goals  Acute Rehab PT Goals PT Goal Formulation: With patient Time For Goal Achievement: 7 days Pt will go Sit to Stand: with modified independence PT Goal: Sit to Stand - Progress: Goal set today Pt will go Stand to Sit: with modified independence PT Goal: Stand to Sit - Progress: Goal set today Pt will Ambulate: 51 - 150 feet;with modified independence;with rolling walker PT Goal: Ambulate - Progress: Goal set today  PT Evaluation Precautions/Restrictions  Precautions Precautions: Fall Required Braces or Orthoses: No Restrictions Weight Bearing Restrictions: No Prior Functioning  Home Living Lives With: Alone Type of Home: Independent living facility (Lives at [facility]) Home Layout: One level Bathroom Toilet: Standard Home Adaptive Equipment: Walker - rolling Additional Comments: can go to assisted living portion of [facility] as needed temporarily Prior Function Level of Independence: Independent with basic ADLs;Independent with homemaking with ambulation;Independent with gait;Independent with transfers Driving: Yes Cognition Cognition Arousal/Alertness: Awake/alert Overall Cognitive Status: Appears within functional limits for tasks assessed Sensation/Coordination Sensation Light Touch: Not tested Coordination Gross Motor Movements are Fluid and Coordinated:  Yes Extremity Assessment RUE Assessment RUE Assessment: Within Functional Limits LUE Assessment LUE Assessment: Within Functional Limits RLE Assessment RLE Assessment: Within Functional Limits LLE  Assessment LLE Assessment: Within Functional Limits Mobility (including Balance) Bed Mobility Bed Mobility: Yes Rolling Left: 7: Independent Left Sidelying to Sit: 7: Independent;HOB elevated (comment degrees) Transfers Transfers: Yes Sit to Stand: 4: Min assist;With upper extremity assist;From toilet Sit to Stand Details (indicate cue type and reason): supervision from bed, min assist from toilet; required manual faciliation to reach standing Stand to Sit: 4: Min assist;To chair/3-in-1;To toilet;With upper extremity assist Stand to Sit Details: required assist to control descent, verbal cues to line up with recliner, use grab bar in bathroom.   Ambulation/Gait Ambulation/Gait: Yes Ambulation/Gait Assistance: 4: Min assist Ambulation/Gait Assistance Details (indicate cue type and reason): for safety and lines management Ambulation Distance (Feet): 20 Feet (20 x 1, 15 x 2 (to bathroom)) Assistive device: Rolling walker Gait Pattern: Step-through pattern;Trunk flexed;Decreased step length - right;Decreased step length - left Gait velocity: slow pace Stairs: No  Posture/Postural Control Posture/Postural Control: Postural limitations Postural Limitations: kyphotic Balance Balance Assessed: No (no history of falls) Exercise    End of Session PT - End of Session Equipment Utilized During Treatment: Gait belt Activity Tolerance: Patient tolerated treatment well Patient left: in chair;with call bell in reach Nurse Communication: Mobility status for ambulation;Mobility status for transfers;Other (comment) (o2 sats) General Behavior During Session: [facility] for tasks performed Cognition: [facility] for tasks performed  Olivia Canter 10/30/2011, 3:52 PM

## 2011-10-30 NOTE — Progress Notes (Signed)
Subjective: Feeling well. Dyspnea improved. Nebulizer treatment affect her eyes.  Objective: Filed Vitals:   10/29/11 2025 10/29/11 2056 10/30/11 0431 10/30/11 0500  BP:  126/73 154/84   Pulse:  77 82   Temp:  97.8 F (36.6 C) 98 F (36.7 C)   TempSrc:  Oral Oral   Resp:  18 16   Height:      Weight:    69.4 kg (153 lb)  SpO2: 96% 92% 97%    Weight change: -10.4 kg (-22 lb 14.8 oz)  Intake/Output Summary (Last 24 hours) at 10/30/11 1024 Last data filed at 10/30/11 0500  Gross per 24 hour  Intake    360 ml  Output   1300 ml  Net   -940 ml    General: Alert, awake, oriented x3, in no acute distress.  HEENT: No bruits, no goiter.  Heart: Regular rate and rhythm, without murmurs, rubs, gallops.  Lungs: Crackles bl, bilateral air movement.  Abdomen: Soft, nontender, nondistended, positive bowel sounds.  Neuro: Grossly intact, nonfocal. Extremities, trace edema.   Lab Results:  [facility] 10/30/11 0620 10/29/11 0630  NA 141 139  K 3.7 3.4*  CL 101 100  CO2 31 27  GLUCOSE 106* 102*  BUN 18 14  CREATININE 0.96 0.92  CALCIUM 9.2 9.3  MG -- --  PHOS -- --    Basename 10/28/11 1634  AST 22  ALT 14  ALKPHOS 90  BILITOT 0.3  PROT 6.4  ALBUMIN 3.3*    Basename 10/30/11 0620 10/28/11 1634  WBC 4.1 4.1  NEUTROABS -- --  HGB 8.8* 8.1*  HCT 28.9* 27.2*  MCV 80.7 82.2  PLT 243 217    Basename 10/29/11 0630  CKTOTAL 311*  CKMB 8.6*  CKMBINDEX --  TROPONINI <0.30   Basename 10/28/11 1634  DDIMER 0.46    Basename 10/30/11 0620  VITAMINB12 --  FOLATE --  FERRITIN --  TIBC --  IRON --  RETICCTPCT 1.3    Micro Results: No results found for this or any previous visit (from the past 240 hour(s)).  Studies/Results: Dg Chest 2 View  10/28/2011  *RADIOLOGY REPORT*  Clinical Data: Shortness of breath, history atrial fibrillation, pacemaker placement, hypertension, breast cancer  CHEST - 2 VIEW  Comparison: 10/22/2011  Findings: Left subclavian sequential  transvenous pacemaker leads project at right atrium and right ventricle. Significant enlargement of cardiac silhouette. Pulmonary vascular congestion. Enlarged central pulmonary arteries question pulmonary arterial hypertension. Marked chronic elevation of right diaphragm. Chronic bronchitic changes right basilar atelectasis. No definite acute failure or consolidation. Calcified granuloma medial right lung base. Osseous demineralization with thoracolumbar kyphosis and scoliosis as well as chronic compression deformity of the thoracolumbar vertebrae.  IMPRESSION: Enlargement of cardiac silhouette with pulmonary venous hypertension and suspect pulmonary arterial hypertension. Pacemaker. Chronic elevation right diaphragm with chronic bronchitic changes and right basilar atelectasis. No acute abnormalities.  Original Report Authenticated By: Lollie Marrow, M.D.    Medications: I have reviewed the patient's current medications.  Patient Active Hospital Problem List:   Acute Diastolic HF exacerbation:  Patient presented with hypoxemia, dyspnea, has mild increase BNP, chest x ray with vascular congestion , pulmonary hypertension.  She is breathing better. Continue with Imdur, Coreg. ECHO ordered. Change lasix to 40 mg IV daily. Had good urine out put. Negative 3L.   Chronic atrial fibrillation  Continue with Coreg and Cardizem, Coumadin. INR 2.8.  Will need to hold coumadin on Sunday.   Hypoxic respiratory failure: Likely secondary to Pulmonary  HTN, Diastolic HF exacerbation. Patient will need evaluation for home oxygen requirement. Continue with albuterol and ipratropium.Marland Kitchen   Hypothyroidism,: Continue with synthroid. Check TSH.  Tachy-brady syndrome; For possible  exchange of Pacemaker generator on Monday. Appreciate cardio help.  Anemia; HB at baseline. anemia panel pending.  Hypokalemia; Will give 20 meq po daily, now that patient is on lasix.  Disposition; PT , TO consult. Evaluation for home  oxygen needs.     LOS: 2 days   Brier Firebaugh M.D.  [facility] 10/30/2011, 10:24 AM

## 2011-10-31 DIAGNOSIS — D649 Anemia, unspecified: Secondary | ICD-10-CM

## 2011-10-31 LAB — BASIC METABOLIC PANEL
GFR calc non Af Amer: 47 mL/min — ABNORMAL LOW (ref >90)
Glucose, Bld: 100 mg/dL — ABNORMAL HIGH (ref 70–99)
Potassium: 3.5 mEq/L (ref 3.5–5.1)
Sodium: 139 mEq/L (ref 135–145)

## 2011-10-31 LAB — PROTIME-INR
INR: 2.57 — ABNORMAL HIGH (ref 0.00–1.49)
Prothrombin Time: 28 seconds — ABNORMAL HIGH (ref 11.6–15.2)

## 2011-10-31 LAB — APTT: aPTT: 27 seconds (ref 24–37)

## 2011-10-31 MED ORDER — SODIUM CHLORIDE 0.9 % IR SOLN
80.0000 mg | Status: DC
  Filled 2011-10-31: qty 2

## 2011-10-31 MED ORDER — SODIUM CHLORIDE 0.9 % IV SOLN: INTRAVENOUS | Status: DC

## 2011-10-31 MED ORDER — OXYCODONE HCL 5 MG PO TABS
5.0000 mg | ORAL_TABLET | Freq: Once | ORAL | Status: AC
  Administered 2011-10-31: 5 mg via ORAL

## 2011-10-31 MED ORDER — POTASSIUM CHLORIDE CRYS ER 20 MEQ PO TBCR
20.0000 meq | EXTENDED_RELEASE_TABLET | Freq: Once | ORAL | Status: AC
  Administered 2011-10-31: 20 meq via ORAL

## 2011-10-31 MED ORDER — SODIUM CHLORIDE 0.45 % IV SOLN: INTRAVENOUS | Status: DC

## 2011-10-31 MED ORDER — POTASSIUM CHLORIDE CRYS ER 20 MEQ PO TBCR
20.0000 meq | EXTENDED_RELEASE_TABLET | Freq: Two times a day (BID) | ORAL | Status: DC
  Administered 2011-10-31 – 2011-11-03 (×6): 20 meq via ORAL
  Filled 2011-10-31 (×7): qty 1

## 2011-10-31 MED ORDER — CHLORHEXIDINE GLUCONATE 4 % EX LIQD
60.0000 mL | Freq: Once | CUTANEOUS | Status: DC
  Filled 2011-10-31: qty 60

## 2011-10-31 MED ORDER — ALBUTEROL SULFATE HFA 108 (90 BASE) MCG/ACT IN AERS
1.0000 | INHALATION_SPRAY | Freq: Four times a day (QID) | RESPIRATORY_TRACT | Status: DC | PRN
  Filled 2011-10-31: qty 6.7

## 2011-10-31 MED ORDER — VANCOMYCIN HCL IN DEXTROSE 1-5 GM/200ML-% IV SOLN
1000.0000 mg | INTRAVENOUS | Status: DC
  Filled 2011-10-31: qty 200

## 2011-10-31 NOTE — Progress Notes (Signed)
ANTICOAGULATION CONSULT NOTE - Follow Up Consult  Pharmacy Consult for Warfarin  Indication: atrial fibrillation   Patient Measurements: Height: 5\' 4"  (162.6 cm) Weight: 166 lb 4.8 oz (75.433 kg) IBW/kg (Calculated) : 54.7   Vital Signs: Temp: 98.2 F (36.8 C) (02/17 0923) Temp src: Oral (02/17 0558) BP: 98/63 mmHg (02/17 0923) Pulse Rate: 88  (02/17 0923)  Labs:  Basename 10/31/11 0551 10/30/11 0940 10/30/11 0620 10/29/11 0630 10/28/11 1634  HGB -- -- 8.8* -- 8.1*  HCT -- -- 28.9* -- 27.2*  PLT -- -- 243 -- 217  APTT 27 -- 32 35 --  LABPROT 28.0* -- 27.8* 30.6* --  INR 2.57* -- 2.54* 2.88* --  HEPARINUNFRC -- -- -- -- --  CREATININE 1.02 0.90 0.96 -- --  CKTOTAL -- -- -- 311* --  CKMB -- -- -- 8.6* --  TROPONINI -- -- -- <0.30 --   Estimated Creatinine Clearance: 37.2 ml/min (by C-G formula based on Cr of 1.02).   Medications:  Scheduled:     . antiseptic oral rinse  15 mL Mouth Rinse BID  . carvedilol  6.25 mg Oral BID WC  . chlorhexidine  60 mL Topical Once  . diltiazem  240 mg Oral Daily  . gentamicin irrigation  80 mg Irrigation On Call  . levothyroxine  37.5 mcg Oral QAC breakfast  . oxyCODONE  5 mg Oral Once  . potassium chloride  20 mEq Oral BID  . potassium chloride  20 mEq Oral Once  . trandolapril  4 mg Oral Daily  . vancomycin  1,000 mg Intravenous On Call  . warfarin  3 mg Oral ONCE-1800  . DISCONTD: albuterol  1 puff Inhalation Q6H  . DISCONTD: furosemide  40 mg Intravenous Daily  . DISCONTD: gentamicin irrigation  80 mg Irrigation On Call  . DISCONTD: isosorbide mononitrate  15 mg Oral Daily  . DISCONTD: potassium chloride  20 mEq Oral Daily  . DISCONTD: vancomycin  1,000 mg Intravenous On Call    Assessment: 78 yo female with chronic afib on coumadin PTA and INR at goal at 2.57. Home dose 3mg  MWF and 4.5mg  STTS.  No bleeding noted per chart notes.  Noted Dr. Earmon Phoenix plan to hold Coumadin tomorrow (2/17) for pacer gen change on Monday.   Spoke to Dr. Daleen Squibb today to confirm, no Coumadin tonight, may resume post-op tomorrow.  Goal of Therapy:  INR 2-3   Plan:  1. No Coumadin tonight for pacer generator change tomorrow. 2. Plan to resume Coumadin tomorrow evening.  Jaxon Mynhier C 10/31/2011,2:25 PM

## 2011-10-31 NOTE — Progress Notes (Signed)
0930 pt got up with assist from [facility], stated she felt weak, states did not strain, no etopic noted during this time with telemetry, BP 98/63, P 88 continued a fib, labs looks good with K+ 3.5.  Will continue to monitor VS, Dr Sunnie Nielsen texted with above info.  Bonney Leitz RN

## 2011-10-31 NOTE — Progress Notes (Signed)
Patient ID: [name], female   DOB: 1920/07/29, 78 y.o.   MRN: [id number] I will also hold her trandolapril and isosorbide. Continue carvedilol and diltiazem for rate control.  Procedure tomorrow, restart all meds and pressure better.

## 2011-10-31 NOTE — Progress Notes (Signed)
Patient ID: [name], female   DOB: 1920-05-24, 78 y.o.   MRN: [id number]  Subjective: No events overnight. Patient denies chest pain, shortness of breath, abdominal pain.   Objective:  Vital signs in last 24 hours:  Filed Vitals:   10/30/11 2144 10/31/11 0558 10/31/11 0923 10/31/11 1530  BP: 151/78 155/80 98/63 122/73  Pulse: 79 72 88 88  Temp: 97.8 F (36.6 C) 98.1 F (36.7 C) 98.2 F (36.8 C) 98.6 F (37 C)  TempSrc: Oral Oral    Resp: 18 18 18 18   Height:      Weight:  75.433 kg (166 lb 4.8 oz)    SpO2: 95% 93% 95% 94%    Intake/Output from previous day:   Intake/Output Summary (Last 24 hours) at 10/31/11 1928 Last data filed at 10/31/11 1800  Gross per 24 hour  Intake    800 ml  Output   1150 ml  Net   -350 ml    Physical Exam: General: Alert, awake, in no acute distress. HEENT: No bruits, no goiter. Moist mucous membranes, no scleral icterus, no conjunctival pallor. Heart: Irregular rate and rhythm, no murmurs, rubs, gallops. Lungs: Clear to auscultation bilaterally. No wheezing, no rhonchi, no rales.  Abdomen: Soft, nontender, nondistended, positive bowel sounds. Extremities: No clubbing or cyanosis, no pitting edema,  positive pedal pulses. Neuro: Grossly nonfocal.  Lab Results:  Basic Metabolic Panel:    Component Value Date/Time   NA 139 10/31/2011 0551   K 3.5 10/31/2011 0551   CL 100 10/31/2011 0551   CO2 32 10/31/2011 0551   BUN 19 10/31/2011 0551   CREATININE 1.02 10/31/2011 0551   GLUCOSE 100* 10/31/2011 0551   CALCIUM 8.9 10/31/2011 0551   CBC:    Component Value Date/Time   WBC 4.1 10/30/2011 0620   WBC 3.7* 01/06/2009 1319   HGB 8.8* 10/30/2011 0620   HGB 11.4* 01/06/2009 1319   HCT 28.9* 10/30/2011 0620   HCT 33.9* 01/06/2009 1319   PLT 243 10/30/2011 0620   PLT 185 01/06/2009 1319   MCV 80.7 10/30/2011 0620   MCV 91.2 01/06/2009 1319   NEUTROABS 2.8 10/26/2011 1241   NEUTROABS 2.1 01/06/2009 1319   LYMPHSABS 0.9 10/26/2011 1241   LYMPHSABS 1.0 01/06/2009 1319   MONOABS 0.7 10/26/2011 1241   MONOABS 0.5 01/06/2009 1319   EOSABS 0.0 10/26/2011 1241   EOSABS 0.0 01/06/2009 1319   BASOSABS 0.0 10/26/2011 1241   BASOSABS 0.0 01/06/2009 1319      Lab 10/30/11 0620 10/28/11 1634 10/26/11 1241  WBC 4.1 4.1 4.4*  HGB 8.8* 8.1* 8.1 Repeated and verified X2.*  HCT 28.9* 27.2* 26.4*  PLT 243 217 227.0  MCV 80.7 82.2 80.6  MCH 24.6* 24.5* --  MCHC 30.4 29.8* 30.8  RDW 15.6* 15.7* 16.7*  LYMPHSABS -- -- 0.9  MONOABS -- -- 0.7  EOSABS -- -- 0.0  BASOSABS -- -- 0.0  BANDABS -- -- --    Lab 10/31/11 0551 10/30/11 0940 10/30/11 0620 10/29/11 0630 10/28/11 1634  NA 139 139 141 139 137  K 3.5 3.9 3.7 3.4* 4.0  CL 100 100 101 100 103  CO2 32 30 31 27 26   GLUCOSE 100* 177* 106* 102* 112*  BUN 19 17 18 14 16   CREATININE 1.02 0.90 0.96 0.92 0.94  CALCIUM 8.9 9.0 9.2 9.3 8.9  MG -- -- -- -- --    Lab 10/31/11 0551 10/30/11 0620 10/29/11 0630 10/28/11 1846 10/26/11 1241  INR 2.57* 2.54*  2.88* 3.16* 3.3*  PROTIME -- -- -- -- --   Cardiac markers:  Lab 10/29/11 0630  CKMB 8.6*  TROPONINI <0.30  MYOGLOBIN --   Studies/Results: No results found.  Medications: Scheduled Meds:   . antiseptic oral rinse  15 mL Mouth Rinse BID  . carvedilol  6.25 mg Oral BID WC  . chlorhexidine  60 mL Topical Once  . diltiazem  240 mg Oral Daily  . gentamicin irrigation  80 mg Irrigation On Call  . levothyroxine  37.5 mcg Oral QAC breakfast  . oxyCODONE  5 mg Oral Once  . potassium chloride  20 mEq Oral BID  . potassium chloride  20 mEq Oral Once  . trandolapril  4 mg Oral Daily  . vancomycin  1,000 mg Intravenous On Call  . DISCONTD: albuterol  1 puff Inhalation Q6H  . DISCONTD: furosemide  40 mg Intravenous Daily  . DISCONTD: gentamicin irrigation  80 mg Irrigation On Call  . DISCONTD: isosorbide mononitrate  15 mg Oral Daily  . DISCONTD: potassium chloride  20 mEq Oral Daily  . DISCONTD: vancomycin  1,000 mg Intravenous On  Call   Continuous Infusions:   . sodium chloride    . sodium chloride     PRN Meds:.acetaminophen, albuterol, ondansetron (ZOFRAN) IV, oxyCODONE  Assessment/Plan:  Acute Diastolic HF exacerbation:  Patient presented with hypoxemia, dyspnea, and mild increase in BNP, chest x ray with vascular congestion , pulmonary hypertension.  Clinically improving with no signs of volume overload on physical exam today. Maintains good urine output.  Chronic atrial fibrillation  Continue with Coreg and Cardizem Appreciate cardiology input Plan for procedure tomorrow 02/18 Hold coumadin prior to procedure Keep NPO after midnight  Hypoxic respiratory failure:  Likely secondary to Pulmonary HTN, Diastolic HF exacerbation.  Patient will need evaluation for home oxygen requirement.  Continue with albuterol and ipratropium.Marland Kitchen   Hypothyroidism,:  Continue with synthroid.  Anemia;  Hb at baseline. anemia panel pending.   Hypokalemia; Resolved  Disposition; PT , TO consult.  Evaluation for home oxygen needs.    EDUCATION - test results and diagnostic studies were discussed with patient - patient verbalized the understanding - questions were answered at the bedside and contact information was provided for additional questions or concerns   LOS: 3 days   [name] 10/31/2011, 7:28 PM  [facility] Pager: [phone]

## 2011-10-31 NOTE — Progress Notes (Signed)
Patient ID: [name], female   DOB: 1920/03/15, 78 y.o.   MRN: [id number] Resting comfortably this am. -430 yesterday. K is 3.5, BUN/Cr slightly increased to 19/1.02. Sats 95%.  Had episode of dizziness and presyncope fall sitting up on the side of this morning. No fall or injury. Relief with lying down.  Physical examination:  Vital signs stable. Blood pressure 90/63, pulse 88, temperature 98.2, respirations 18, stat 95%.  No acute distress. Alert and oriented x3.  No JVD, carotids full   Heart- irregular rate and rhythm, lungs clear to auscultation.  Abdomen nondistended.  Extremities no edema  Neurological nonfocal  Assessment and plan:  [name] is a little volume depleted this morning and relatively hypotensive. No Lasix today and hold n.p.o. past midnight for change out of generator.

## 2011-10-31 NOTE — Progress Notes (Signed)
Pt confused/forgetful at times.  Stated previously she lived at home alone, her niece tells me she has help that comes into her home, now the pt tells me she lives at a Assisted Living Home, but when she leaves she states she will have to go to an Assisted Living part of the home where she stays part of the time.  She also states she is going to have a garden and grow some vegetables so she can cook for herself.   Able to get oob with assistance and using her walker, seems to have more difficulty bringing her left foot forward.  Is not able to get up from sitting position by herself.  Bonney Leitz RN

## 2011-11-01 ENCOUNTER — Other Ambulatory Visit: Payer: Medicare Other | Admitting: *Deleted

## 2011-11-01 ENCOUNTER — Ambulatory Visit (HOSPITAL_COMMUNITY): Admission: RE | Admit: 2011-11-01 | Payer: Medicare Other | Source: Ambulatory Visit | Admitting: Internal Medicine

## 2011-11-01 ENCOUNTER — Encounter (HOSPITAL_COMMUNITY)
Admission: EM | Disposition: A | Discharge status: Skilled Nursing Facility | Payer: Self-pay | Source: Home / Self Care | Attending: Internal Medicine

## 2011-11-01 DIAGNOSIS — I495 Sick sinus syndrome: Secondary | ICD-10-CM

## 2011-11-01 DIAGNOSIS — D649 Anemia, unspecified: Secondary | ICD-10-CM

## 2011-11-01 HISTORY — PX: PACEMAKER GENERATOR CHANGE: SHX5481

## 2011-11-01 LAB — TYPE AND SCREEN
ABO/RH(D): A NEG
Unit division: 0

## 2011-11-01 LAB — BASIC METABOLIC PANEL
BUN: 22 mg/dL (ref 6–23)
CO2: 29 mEq/L (ref 19–32)
Chloride: 104 mEq/L (ref 96–112)
Creatinine, Ser: 1 mg/dL (ref 0.50–1.10)
Glucose, Bld: 120 mg/dL — ABNORMAL HIGH (ref 70–99)

## 2011-11-01 LAB — CBC
HCT: 31.2 % — ABNORMAL LOW (ref 36.0–46.0)
Hemoglobin: 9.3 g/dL — ABNORMAL LOW (ref 12.0–15.0)
MCV: 80.4 fL (ref 78.0–100.0)
RBC: 3.88 MIL/uL (ref 3.87–5.11)
WBC: 4.6 10*3/uL (ref 4.0–10.5)

## 2011-11-01 LAB — SURGICAL PCR SCREEN: MRSA, PCR: NEGATIVE

## 2011-11-01 LAB — T4, FREE: Free T4: 1.07 ng/dL (ref 0.80–1.80)

## 2011-11-01 SURGERY — PACEMAKER GENERATOR CHANGE
Anesthesia: LOCAL

## 2011-11-01 MED ORDER — SODIUM CHLORIDE 0.9 % IR SOLN: 80.0000 mg | Status: DC

## 2011-11-01 MED ORDER — ATENOLOL 25 MG PO TABS
25.0000 mg | ORAL_TABLET | Freq: Every day | ORAL | Status: DC
  Administered 2011-11-01 – 2011-11-03 (×3): 25 mg via ORAL
  Filled 2011-11-01 (×3): qty 1

## 2011-11-01 MED ORDER — CHLORHEXIDINE GLUCONATE 4 % EX LIQD: 60.0000 mL | Freq: Once | CUTANEOUS | Status: DC

## 2011-11-01 MED ORDER — ACETAMINOPHEN 325 MG PO TABS
325.0000 mg | ORAL_TABLET | ORAL | Status: DC | PRN
Start: 2011-11-01 — End: 2011-11-03

## 2011-11-01 MED ORDER — LIDOCAINE HCL (PF) 1 % IJ SOLN
INTRAMUSCULAR | Status: AC
  Filled 2011-11-01: qty 60

## 2011-11-01 MED ORDER — SODIUM CHLORIDE 0.9 % IV SOLN: INTRAVENOUS | Status: DC

## 2011-11-01 MED ORDER — VANCOMYCIN HCL IN DEXTROSE 1-5 GM/200ML-% IV SOLN: 1000.0000 mg | INTRAVENOUS | Status: DC

## 2011-11-01 MED ORDER — SODIUM CHLORIDE 0.45 % IV SOLN: INTRAVENOUS | Status: DC

## 2011-11-01 MED ORDER — WARFARIN SODIUM 4 MG PO TABS
4.5000 mg | ORAL_TABLET | Freq: Once | ORAL | Status: AC
  Administered 2011-11-01: 4.5 mg via ORAL
  Filled 2011-11-01: qty 1

## 2011-11-01 MED ORDER — ONDANSETRON HCL 4 MG/2ML IJ SOLN: 4.0000 mg | Freq: Four times a day (QID) | INTRAMUSCULAR | Status: DC | PRN

## 2011-11-01 MED ORDER — MIDAZOLAM HCL 5 MG/5ML IJ SOLN
INTRAMUSCULAR | Status: AC
  Filled 2011-11-01: qty 5

## 2011-11-01 MED ORDER — FENTANYL CITRATE 0.05 MG/ML IJ SOLN
INTRAMUSCULAR | Status: AC
  Filled 2011-11-01: qty 2

## 2011-11-01 MED ORDER — ATENOLOL 25 MG PO TABS: 25.0000 mg | ORAL_TABLET | Freq: Every day | ORAL | Status: DC

## 2011-11-01 MED ORDER — FERROUS SULFATE 325 (65 FE) MG PO TABS
325.0000 mg | ORAL_TABLET | Freq: Two times a day (BID) | ORAL | Status: DC
  Administered 2011-11-01 – 2011-11-03 (×4): 325 mg via ORAL
  Filled 2011-11-01 (×6): qty 1

## 2011-11-01 MED ORDER — FUROSEMIDE 20 MG PO TABS
20.0000 mg | ORAL_TABLET | Freq: Every day | ORAL | Status: DC
  Administered 2011-11-02: 20 mg via ORAL
  Filled 2011-11-01: qty 1

## 2011-11-01 NOTE — Progress Notes (Signed)
ANTICOAGULATION CONSULT NOTE - Follow Up Consult  Pharmacy Consult for Coumadin Indication: Afib s/p generator replacement  Allergies  Allergen Reactions  . Amiodarone Other (See Comments)    dizziness  . Beet Other (See Comments)    unknown  . Benzyl Alc-Guai-Pse Other (See Comments)    unknown  . Caffeine Other (See Comments)    unknown  . Chocolate Other (See Comments)    unknown  . Codeine Phosphate Other (See Comments)    unknown  . Darvon Other (See Comments)    unknown  . Dilaudid (Hydromorphone Hcl) Other (See Comments)    unknown  . Dimetapp Cold-Allergy Other (See Comments)    unknown  . Dust Mite Extract Other (See Comments)    unknown  . Estrogens Other (See Comments)    unknown  . Fungizone (Amphotericin B) Other (See Comments)    unknown  . Iodine Other (See Comments)    unknown  . Librax Other (See Comments)    unknown  . Micardis (Telmisartan) Other (See Comments)    Fatigue and back pain  . Milk (Dairy) Other (See Comments)    unknown  . Mold Extract (Trichophyton Mentagrophyte) Other (See Comments)    unknown  . Motrin (Ibuprofen) Other (See Comments)    unknown  . Penicillins Other (See Comments)    unknown  . Petroleum Jelly (Petrolatum) Other (See Comments)    unknown  . Pineapple Other (See Comments)    unknown  . Plavix (Clopidogrel Bisulfate) Other (See Comments)    dizziness  . Prednisone Other (See Comments)    unknown  . Soap Other (See Comments)    unknown  . Sulfamethoxazole Other (See Comments)    unknown  . Tagamet (Cimetidine) Other (See Comments)    unknown  . Talwin Other (See Comments)    unknown  . Tape Other (See Comments)    unknown  . Watermelon Concentrate Other (See Comments)    unknown  . Zithromax (Azithromycin Dihydrate) Other (See Comments)    unknown  . Dyazide (Hydrochlorothiazide W/Triamterene) Rash  . Erythromycin Other (See Comments)    unknown    Patient Measurements: Height: 5\' 4"  (162.6  cm) Weight: 166 lb 4.8 oz (75.433 kg) IBW/kg (Calculated) : 54.7    Vital Signs: Temp: 98.6 F (37 C) (02/18 0946) Temp src: Oral (02/18 0946) BP: 145/84 mmHg (02/18 0946) Pulse Rate: 84  (02/18 0946)  Labs:  Basename 11/01/11 0538 10/31/11 0551 10/30/11 0940 10/30/11 0620  HGB 9.3* -- -- 8.8*  HCT 31.2* -- -- 28.9*  PLT 255 -- -- 243  APTT 28 27 -- 32  LABPROT 22.8* 28.0* -- 27.8*  INR 1.97* 2.57* -- 2.54*  HEPARINUNFRC -- -- -- --  CREATININE 1.00 1.02 0.90 --  CKTOTAL -- -- -- --  CKMB -- -- -- --  TROPONINI -- -- -- --   Estimated Creatinine Clearance: 37.9 ml/min (by C-G formula based on Cr of 1).  Assessment: Resuming Coumadin s/p generator replacement.  INR=1.97 today  Goal of Therapy:  INR 2-3   Plan:  1) Coumadin 4.5 mg po x 1 dose today 2) Daily INR  Elwin Sleight 11/01/2011,11:45 AM

## 2011-11-01 NOTE — Progress Notes (Signed)
Patient Name: [name]      SUBJECTIVE: feeling better, but not being allowed to walk  Walked this am, and said it went ok  Past Medical History  Diagnosis Date  . Arthritis   . Chronic atrial fibrillation       chronic coumadin anticoagulation  . Hypertension   . Thyroid disease     hypothyroidism  . Esophageal reflux     with paraesophageal hernia  . Diverticulosis   . Hx of breast cancer     s/p lumpectomy   . Spinal stenosis     with chronic LBP  . Anemia   . Vitamin d deficiency   . Osteoporosis   . UTI (lower urinary tract infection)   . Tachy-brady syndrome   . Chest pain   . Varicose veins   . Fatigue   . History of RIND (reversible ischemic neurological deficit)   . Pacemaker     medtronic  . Hypothyroidism     PHYSICAL EXAM Filed Vitals:   10/31/11 0923 10/31/11 1530 10/31/11 2113 11/01/11 0500  BP: 98/63 122/73 121/72 123/73  Pulse: 88 88 74 84  Temp: 98.2 F (36.8 C) 98.6 F (37 C) 98.3 F (36.8 C) 97.7 F (36.5 C)  TempSrc:   Oral Oral  Resp: 18 18 18 18   Height:      Weight:      SpO2: 95% 94% 92% 95%    Well developed and nourished in no acute distress HENT normal Neck supple  aoirway3 Clear Irregularly irregular rate and rhythm with controlled ventricular response, no murmurs or gallops Abd-soft with active BS without hepatomegaly No Clubbing cyanosis edema, but tender Skin-warm and dry A & Oriented  Grossly normal sensory and motor function  TELEMETRY: Reviewed telemetry pt in afib:    Intake/Output Summary (Last 24 hours) at 11/01/11 0944 Last data filed at 11/01/11 0757  Gross per 24 hour  Intake    560 ml  Output   1275 ml  Net   -715 ml    LABS: Basic Metabolic Panel:  Lab 11/01/11 1610 10/31/11 0551 10/30/11 0940 10/30/11 0620 10/29/11 0630 10/28/11 1634 10/26/11 1241  NA 141 139 139 141 139 137 139  K 4.3 3.5 3.9 3.7 3.4* 4.0 4.3  CL 104 100 100 101 100 103 104  CO2 29 32 30 31 27 26 27   GLUCOSE  120* 100* 177* 106* 102* 112* 108*  BUN 22 19 17 18 14 16 21   CREATININE 1.00 1.02 0.90 0.96 0.92 0.94 1.1  CALCIUM 9.4 8.9 -- -- -- -- --  MG -- -- -- -- -- -- --  PHOS -- -- -- -- -- -- --   Cardiac Enzymes: No results found for this basename: CKTOTAL:3,CKMB:3,CKMBINDEX:3,TROPONINI:3 in the last 72 hours CBC:  Lab 11/01/11 0538 10/30/11 0620 10/28/11 1634 10/26/11 1241  WBC 4.6 4.1 4.1 4.4*  NEUTROABS -- -- -- 2.8  HGB 9.3* 8.8* 8.1* 8.1 Repeated and verified X2.*  HCT 31.2* 28.9* 27.2* 26.4*  MCV 80.4 80.7 82.2 80.6  PLT 255 243 217 227.0   PROTIME:  Basename 11/01/11 0538 10/31/11 0551 10/30/11 0620  LABPROT 22.8* 28.0* 27.8*  INR 1.97* 2.57* 2.54*    Thyroid Function Tests:  Basename 10/30/11 0620  TSH 0.498  T4TOTAL --  T3FREE --  THYROIDAB --   Anemia Panel:  Basename 10/30/11 0620  VITAMINB12 440  FOLATE 16.9  FERRITIN 5*  TIBC 464  IRON 22*  RETICCTPCT 1.3  Device Interrogation:at [facility]    ASSESSMENT AND PLAN:  Patient Active Hospital Problem List: Dyspnea (10/28/2011)   Assessment: improved   Plan:  Atrial fibrillation (10/12/2011)   Assessment: permanent   Plan: on coumadin Pacemaker-Mdt dual (10/12/2011)   Assessment: at [facility]     Plan: will change out today  We have reviewed the benefits and risks of generator replacement.  These include but are not limited to lead fracture and infection.  The patient understands, agrees and is willing to proceed.    Diastolic HF (heart failure) (10/28/2011)   Assessment: imoproved   Plan: may need low dose intermittent po diuretic Tachy-brady syndrome (11/01/2011)   Assessment: as above   Plan: we had started her on atenolol at home which she was tolerating  Will switch back  Anemia--presumed iron deficient    Signed, Sherryl Manges MD  11/01/2011

## 2011-11-01 NOTE — Interval H&P Note (Signed)
History and Physical Interval Note:  11/01/2011 9:38 AM  [name]  has presented today for surgery, with the diagnosis of eri  The various methods of treatment have been discussed with the patient and family. After consideration of risks, benefits and other options for treatment, the patient has consented to  Procedure(s) (LRB): PACEMAKER GENERATOR CHANGE (N/A) as a surgical intervention .  The patients' history has been reviewed, patient examined, no change in status, stable for surgery.  I have reviewed the patients' chart and labs.  Questions were answered to the patient's satisfaction.     [name]  Pt admitted with hypoxia which has resolved and iron deficiency anemia   Her pacer generator is at [facility]; she will undergo generator replacement

## 2011-11-01 NOTE — Progress Notes (Signed)
Pt gone for procedure---OT eval cancelled for today. Will attempt eval 11/02/2011.  Ignacia Palma, [place] [phone] 11/01/2011

## 2011-11-01 NOTE — Progress Notes (Signed)
Subjective: Patient relates breathing better. No chest pain.   Objective: Filed Vitals:   11/01/11 0500 11/01/11 0946 11/01/11 1400 11/01/11 1408  BP: 123/73 145/84 160/90 127/61  Pulse: 84 84 81 79  Temp: 97.7 F (36.5 C) 98.6 F (37 C) 97.8 F (36.6 C)   TempSrc: Oral Oral Oral   Resp: 18 18 17    Height:      Weight:      SpO2: 95% 96% 93%    Weight change:   Intake/Output Summary (Last 24 hours) at 11/01/11 1837 Last data filed at 11/01/11 0757  Gross per 24 hour  Intake      0 ml  Output   1000 ml  Net  -1000 ml    General: Alert, awake, oriented x3, in no acute distress.  HEENT: No bruits, no goiter.  Heart: Regular rate and rhythm, without murmurs, rubs, gallops.  Lungs: CTA, bilateral air movement.  Abdomen: Soft, nontender, nondistended, positive bowel sounds.  Neuro: Grossly intact, nonfocal. Extremities; trace edema.   Lab Results:  [facility] 11/01/11 0538 10/31/11 0551  NA 141 139  K 4.3 3.5  CL 104 100  CO2 29 32  GLUCOSE 120* 100*  BUN 22 19  CREATININE 1.00 1.02  CALCIUM 9.4 8.9  MG -- --  PHOS -- --     Basename 11/01/11 0538 10/30/11 0620  WBC 4.6 4.1  NEUTROABS -- --  HGB 9.3* 8.8*  HCT 31.2* 28.9*  MCV 80.4 80.7  PLT 255 243    Basename 10/30/11 0620  TSH 0.498  T4TOTAL --  T3FREE --  THYROIDAB --    Basename 10/30/11 0620  VITAMINB12 440  FOLATE 16.9  FERRITIN 5*  TIBC 464  IRON 22*  RETICCTPCT 1.3    Micro Results: Recent Results (from the past 240 hour(s))  SURGICAL PCR SCREEN     Status: Abnormal   Collection Time   10/31/11 11:22 PM      Component Value Range Status Comment   MRSA, PCR NEGATIVE  NEGATIVE  Final    Staphylococcus aureus POSITIVE (*) NEGATIVE  Final     Studies/Results: No results found.  Medications: I have reviewed the patient's current medications.  Acute Diastolic HF exacerbation:  Patient presented with hypoxemia, dyspnea, has mild increase BNP, chest x ray with vascular congestion ,  pulmonary hypertension.  She is breathing better.  I will start low dose lasix 20 mg daily starting 2-19. Imdur and trandolapril  was discontinue due to near syncope event on 2-17.   Chronic atrial fibrillation  Continue Cardizem, Coumadin. INR 2.8. Coreg change to atenolol.  Restart coumadin.    Hypoxic respiratory failure: Likely secondary to Pulmonary HTN, Diastolic HF exacerbation. Patient will need evaluation for home oxygen requirement. Continue with albuterol and ipratropium.Marland Kitchen   Hypothyroidism,: Continue with synthroid. Check TSH.  Tachy-brady syndrome; For possible exchange of Pacemaker generator on Monday. Appreciate cardio help.  Anemia; HB at baseline. Anemia iron deficiency.  I will start ferrous sulfate. She will need to discuss with PCP colonoscopy ??  Hypokalemia; Will give 20 meq po daily, now that patient is on lasix.  Disposition; PT , TO consult.  Evaluation for home oxygen needs.   Will consider discharge home 2-19 if ok with cardio.      LOS: 4 days   Jameil Whitmoyer M.D.  [facility] 11/01/2011, 6:37 PM

## 2011-11-01 NOTE — Op Note (Signed)
.  Preoperative diagnosis tachybrady, eri Postoperative diagnosis same/  Procedure: Generator replacement    Following informed consent the patient was brought to the electrophysiology laboratory in place of the fluoroscopic table in the supine position after routine prep and drape lidocaine was infiltrated in the region of the previous incision and carried down to later the device pocket using sharp dissection and electrocautery. The pocket was opened the device was freed up and was explanted.  Interrogation of the previously implanted ventricular lead medtronic [id number] V  demonstrated an R wave of 25 millivolts., and impedance of 664ohms, and a pacing threshold of 0.5 volts at .05  msec.    The previously implanted atrial lead was capped and secured behind the generator  The leads were inspected. The lead-ventricular was then attached to a medtronic sensia pulse generator, serial number [id number].    The pocket was irrigated with antibiotic containing saline solution hemostasis was assured and the lead and the device were placed in the pocket and secured to the prepectoral fasica. The wound was then closed in 3 layers in normal fashion.  The patient tolerated the procedure without apparent complication.  Sherryl Manges

## 2011-11-01 NOTE — H&P (View-Only) (Signed)
HPI  [name] is a 78 y.o. female Seen at the request of [facility] cardiology for pacemaker followup. This was implanted 2004 for tachybradycardia syndrome. She initially had paroxysmal atrial fibrillation treated with sotalol which apparently by history is permanent. She does take warfarin.  Her device has reached [facility].  She also has a history of hypertension.  She was widowed about a year ago. She has no children and no family. She is quite isolated and has had a  very hard year.  The patient denies chest pain, shortness of breath, nocturnal dyspnea, orthopnea or peripheral edema.  There have been no palpitations, lightheadedness or syncope.     Past Medical History  Diagnosis Date  . Arthritis   . Chronic atrial fibrillation on chronic coumadin anticoagulation  . Hypertension   . Thyroid disease hypothyroidism  . Esophageal reflux with paraesophageal hernia  . Diverticulosis   . Hx of breast cancer s/p lumpectomy   . Spinal stenosis with chronic LBP  . Anemia   . Vitamin D deficiency   . Depression   . Osteoporosis   . UTI (lower urinary tract infection)   . Proteus mirabilis infection   . Tachy-brady syndrome   . Cancer     history of breast cancer with a lumpectomy  . Chest pain   . Varicose veins   . Fatigue   . History of depression   . History of RIND (reversible ischemic neurological deficit)     Past Surgical History  Procedure Date  . Tonsillectomy   . Appendectomy   . Tumor removal     ovary  . Breast surgery     rt- lupectomy  . Eye surgery     cataract bilateral    Current Outpatient Prescriptions  Medication Sig Dispense Refill  . diclofenac sodium (VOLTAREN) 1 % GEL Place onto the skin as directed.        . diltiazem (CARDIZEM CD) 240 MG 24 hr capsule Take 240 mg by mouth daily.        . fosfomycin (MONUROL) 3 G PACK Take 3 g by mouth once.        . Hypromellose (GENTEAL) 0.3 % SOLN Apply to eye as needed. 1 drop in both eyes as needed        . levothyroxine (LEVOXYL) 50 MCG tablet Take 50 mcg by mouth daily.        . Multiple Vitamins-Minerals (ICAPS) CAPS Take by mouth.        . oxyCODONE (OXY IR/ROXICODONE) 5 MG immediate release tablet Take 5 mg by mouth every 4 (four) hours as needed.        . trandolapril (MAVIK) 4 MG tablet Take 4 mg by mouth daily.        Marland Kitchen warfarin (COUMADIN) 3 MG tablet Take 3 mg by mouth daily.          Allergies  Allergen Reactions  . Amiodarone     dizziness  . Beet   . Benzyl Alc-Guai-Pse   . Caffeine   . Chocolate   . Codeine Phosphate   . Darvon   . Dilaudid (Hydromorphone Hcl)   . Dimetapp Cold-Allergy   . Dust Mite Extract   . Dyazide (Hydrochlorothiazide W/Triamterene)     rash  . Erythromycin   . Estrogens   . Fungizone (Amphotericin B)   . Iodine   . Librax   . Micardis (Telmisartan)     Fatigue and back pain  . Milk (Dairy)   .  Mold Extract (Trichophyton Mentagrophyte)   . Motrin (Ibuprofen)   . Penicillins   . Petroleum Jelly (Petrolatum)   . Pineapple   . Plavix (Clopidogrel Bisulfate)     dizziness  . Prednisone   . Soap   . Sulfamethoxazole   . Tagamet (Cimetidine)   . Talwin   . Tape   . Watermelon Concentrate   . Zithromax (Azithromycin Dihydrate)     Review of Systems negative except from HPI and PMH  Physical Exam BP 149/89  Pulse 88  Ht 5\' 4"  (1.626 m)  Wt 171 lb 12.8 oz (77.928 kg)  BMI 29.49 kg/m2 Well developed and well nourished in no acute distress HENT normal E scleral and icterus clear Neck Supple JVP flat; carotids brisk and full Clear to ausculation Device pocket well-healed  Regular rate and rhythm, no murmurs gallops or rub Soft with active bowel sounds No clubbing cyanosis none Edema Alert and oriented, grossly normal motor and sensory function Skin Warm and Dry   Assessment and  Plan

## 2011-11-02 ENCOUNTER — Other Ambulatory Visit: Payer: Self-pay

## 2011-11-02 ENCOUNTER — Encounter (HOSPITAL_COMMUNITY): Payer: Self-pay | Admitting: Internal Medicine

## 2011-11-02 DIAGNOSIS — I509 Heart failure, unspecified: Secondary | ICD-10-CM

## 2011-11-02 LAB — CBC
MCV: 80.8 fL (ref 78.0–100.0)
Platelets: 274 10*3/uL (ref 150–400)
RBC: 3.9 MIL/uL (ref 3.87–5.11)
RDW: 15.7 % — ABNORMAL HIGH (ref 11.5–15.5)
WBC: 6.1 10*3/uL (ref 4.0–10.5)

## 2011-11-02 LAB — BASIC METABOLIC PANEL
CO2: 27 mEq/L (ref 19–32)
Calcium: 9.1 mg/dL (ref 8.4–10.5)
Chloride: 103 mEq/L (ref 96–112)
Creatinine, Ser: 0.98 mg/dL (ref 0.50–1.10)
GFR calc Af Amer: 58 mL/min — ABNORMAL LOW (ref >90)
Sodium: 137 mEq/L (ref 135–145)

## 2011-11-02 LAB — APTT: aPTT: 27 seconds (ref 24–37)

## 2011-11-02 LAB — PROTIME-INR: Prothrombin Time: 20.2 seconds — ABNORMAL HIGH (ref 11.6–15.2)

## 2011-11-02 MED ORDER — WARFARIN SODIUM 6 MG PO TABS
6.0000 mg | ORAL_TABLET | Freq: Once | ORAL | Status: AC
  Administered 2011-11-02: 6 mg via ORAL
  Filled 2011-11-02: qty 1

## 2011-11-02 MED ORDER — ALBUTEROL SULFATE HFA 108 (90 BASE) MCG/ACT IN AERS: 1.0000 | INHALATION_SPRAY | Freq: Four times a day (QID) | RESPIRATORY_TRACT | Status: DC | PRN

## 2011-11-02 MED ORDER — ATENOLOL 25 MG PO TABS: 25.0000 mg | ORAL_TABLET | Freq: Every day | ORAL | Status: DC

## 2011-11-02 MED ORDER — FUROSEMIDE 20 MG PO TABS
20.0000 mg | ORAL_TABLET | ORAL | Status: DC
  Administered 2011-11-03: 20 mg via ORAL
  Filled 2011-11-02: qty 1

## 2011-11-02 MED ORDER — FERROUS SULFATE 325 (65 FE) MG PO TABS: 325.0000 mg | ORAL_TABLET | Freq: Two times a day (BID) | ORAL | Status: DC

## 2011-11-02 MED ORDER — FUROSEMIDE 20 MG PO TABS: 20.0000 mg | ORAL_TABLET | ORAL | Status: DC

## 2011-11-02 MED ORDER — WARFARIN SODIUM 6 MG PO TABS: 6.0000 mg | ORAL_TABLET | Freq: Once | ORAL | Status: DC

## 2011-11-02 NOTE — Progress Notes (Signed)
Clinical social worker still following to assist with [facility]. CSW is awaiting pasarr # for discharge. Pt anticipated to dc to [facility] tomorrow pending pasarr #. CSW informed RN, and pt MD. Pt RN to inform Pt and sitter.   Catha Gosselin, Theresia Majors  [phone] .11/02/2011 16:29pm

## 2011-11-02 NOTE — Progress Notes (Signed)
Patient Name: [name]      SUBJECTIVE: s./p pacer generator replacement yesterday  Past Medical History  Diagnosis Date  . Arthritis   . Chronic atrial fibrillation       chronic coumadin anticoagulation  . Hypertension   . Thyroid disease     hypothyroidism  . Esophageal reflux     with paraesophageal hernia  . Diverticulosis   . Hx of breast cancer     s/p lumpectomy   . Spinal stenosis     with chronic LBP  . Anemia   . Vitamin d deficiency   . Osteoporosis   . UTI (lower urinary tract infection)   . Tachy-brady syndrome   . Chest pain   . Varicose veins   . Fatigue   . History of RIND (reversible ischemic neurological deficit)   . Pacemaker     medtronic  . Hypothyroidism     PHYSICAL EXAM Filed Vitals:   11/01/11 1400 11/01/11 1408 11/01/11 2100 11/02/11 0500  BP: 160/90 127/61 129/73 134/78  Pulse: 81 79 66 78  Temp: 97.8 F (36.6 C)  98.9 F (37.2 C) 98.6 F (37 C)  TempSrc: Oral  Oral Oral  Resp: 17  18 18   Height:      Weight:    160 lb 11.5 oz (72.9 kg)  SpO2: 93%  94% 94%    Well developed and nourished in no acute distress HENT normal Neck supple  Pocket without hematoma Clear Irregularly irregular rate and rhythm with controlled ventricular response, no murmurs or gallops Abd-soft with active BS  No Clubbing cyanosis edema, but tender Skin-warm and dry A & Oriented  Grossly normal sensory and motor function  TELEMETRY: Reviewed telemetry pt in afib:    Intake/Output Summary (Last 24 hours) at 11/02/11 0833 Last data filed at 11/02/11 0700  Gross per 24 hour  Intake    240 ml  Output    750 ml  Net   -510 ml    LABS: Basic Metabolic Panel:  Lab 11/02/11 0347 11/01/11 0538 10/31/11 0551 10/30/11 0940 10/30/11 0620 10/29/11 0630 10/28/11 1634  NA 137 141 139 139 141 139 137  K 4.5 4.3 3.5 3.9 3.7 3.4* 4.0  CL 103 104 100 100 101 100 103  CO2 27 29 32 30 31 27 26   GLUCOSE 103* 120* 100* 177* 106* 102* 112*  BUN  20 22 19 17 18 14 16   CREATININE 0.98 1.00 1.02 0.90 0.96 0.92 0.94  CALCIUM 9.1 9.4 -- -- -- -- --  MG -- -- -- -- -- -- --  PHOS -- -- -- -- -- -- --   Cardiac Enzymes: No results found for this basename: CKTOTAL:3,CKMB:3,CKMBINDEX:3,TROPONINI:3 in the last 72 hours CBC:  Lab 11/02/11 0620 11/01/11 0538 10/30/11 0620 10/28/11 1634 10/26/11 1241  WBC 6.1 4.6 4.1 4.1 4.4*  NEUTROABS -- -- -- -- 2.8  HGB 9.2* 9.3* 8.8* 8.1* 8.1 Repeated and verified X2.*  HCT 31.5* 31.2* 28.9* 27.2* 26.4*  MCV 80.8 80.4 80.7 82.2 80.6  PLT 274 255 243 217 227.0   PROTIME:  Basename 11/02/11 0620 11/01/11 0538 10/31/11 0551  LABPROT 20.2* 22.8* 28.0*  INR 1.69* 1.97* 2.57*    Thyroid Function Tests: No results found for this basename: TSH,T4TOTAL,FREET3,T3FREE,THYROIDAB in the last 72 hours Anemia Panel: No results found for this basename: VITAMINB12,FOLATE,FERRITIN,TIBC,IRON,RETICCTPCT in the last 72 hours   Device Interrogation:at [facility]    ASSESSMENT AND PLAN:  Patient Active Hospital Problem List:  Dyspnea (10/28/2011)   Assessment: improved   Plan:  Atrial fibrillation (10/12/2011)   Assessment: permanent   Plan: on coumadin Pacemaker-Mdt dual (10/12/2011)   Assessment: at [facility]     Plan: s/p change out yesterday  Diastolic HF (heart failure) (10/28/2011)   Assessment: imoproved   Plan: may need low dose intermittent po diuretic perhaps 20 lasix qod  Tachy-brady syndrome (11/01/2011)   Assessment: as above   Plan: we had started her on atenolol at home which she was tolerating  Will switch back  Anemia--presumed iron deficient ? Cause  Will need office followup  Will sign off   Call for further inhouse questions   Signed, Sherryl Manges MD  11/02/2011

## 2011-11-02 NOTE — Progress Notes (Signed)
Physical Therapy Treatment Patient Details Name: [name] MRN: [id number] DOB: 1920-01-10 Today's Date: 11/02/2011  PT Assessment/Plan  PT - Assessment/Plan Comments on Treatment Session: Pt currently requiring Min (A) for sit<>stands & ambulation.  Pt is stating that she feels much weaker today than she did during last PT session (10/30/11).    Caregiver present entire session.  Pt has caregiver available from 9-2 during day but unsure if she will have someone with her at nights when she returns home as she did prior to admission to hospital.  Pt will need Supervision/(A) for mobility/OOB.    PT Frequency: Min 3X/week Follow Up Recommendations: Home health PT;Supervision for mobility/OOB Equipment Recommended: None recommended by PT PT Goals  Acute Rehab PT Goals PT Goal: Sit to Stand - Progress: Not met PT Goal: Stand to Sit - Progress: Not met PT Goal: Ambulate - Progress: Not met  PT Treatment Precautions/Restrictions  Precautions Precautions: Fall Required Braces or Orthoses: No Restrictions Weight Bearing Restrictions: No Mobility (including Balance) Bed Mobility Bed Mobility: No Transfers Sit to Stand: 4: Min assist;From chair/3-in-1;From bed;With armrests;With upper extremity assist Sit to Stand Details (indicate cue type and reason): (A) to achieve full upright standing & balance.  Cues for hand placement & technique.   Stand to Sit: 4: Min assist;To bed;To chair/3-in-1;With upper extremity assist;With armrests Stand to Sit Details: (A) for RW management, Cues for body positioning before sitting, cues for hand placement, & (A) to control descent.   Ambulation/Gait Ambulation/Gait Assistance: 4: Min assist Ambulation/Gait Assistance Details (indicate cue type and reason): (A) for RW management especially with turns, (A) for balance, & safety.  Cues for upright posture, safe use of RW, sequencing, & advancement of LE's/RW.  Pt with shuffle-like steps, flexed trunk, &  difficulty initiating steps.  Pt states this is not normal for her.   Ambulation Distance (Feet): 15 Feet Assistive device: Rolling walker Gait Pattern: Decreased step length - right;Decreased step length - left;Shuffle;Decreased hip/knee flexion - left;Decreased hip/knee flexion - right;Trunk flexed Stairs: No Wheelchair Mobility Wheelchair Mobility: No  Balance Balance Assessed: No Exercise    End of Session PT - End of Session Equipment Utilized During Treatment: Gait belt Activity Tolerance: Patient tolerated treatment well Patient left: in chair;with call bell in reach;with family/visitor present Nurse Communication: Mobility status for transfers;Mobility status for ambulation General Behavior During Session: [facility] for tasks performed Cognition: [facility] for tasks performed  Lara Mulch 11/02/2011, 2:01 PM [phone]

## 2011-11-02 NOTE — Progress Notes (Signed)
ANTICOAGULATION CONSULT NOTE - Follow Up Consult  Pharmacy Consult for Coumadin Indication: Afib s/p generator replacement  Allergies  Allergen Reactions  . Amiodarone Other (See Comments)    dizziness  . Beet Other (See Comments)    unknown  . Benzyl Alc-Guai-Pse Other (See Comments)    unknown  . Caffeine Other (See Comments)    unknown  . Chocolate Other (See Comments)    unknown  . Codeine Phosphate Other (See Comments)    unknown  . Darvon Other (See Comments)    unknown  . Dilaudid (Hydromorphone Hcl) Other (See Comments)    unknown  . Dimetapp Cold-Allergy Other (See Comments)    unknown  . Dust Mite Extract Other (See Comments)    unknown  . Estrogens Other (See Comments)    unknown  . Fungizone (Amphotericin B) Other (See Comments)    unknown  . Iodine Other (See Comments)    unknown  . Librax Other (See Comments)    unknown  . Micardis (Telmisartan) Other (See Comments)    Fatigue and back pain  . Milk (Dairy) Other (See Comments)    unknown  . Mold Extract (Trichophyton Mentagrophyte) Other (See Comments)    unknown  . Motrin (Ibuprofen) Other (See Comments)    unknown  . Penicillins Other (See Comments)    unknown  . Petroleum Jelly (Petrolatum) Other (See Comments)    unknown  . Pineapple Other (See Comments)    unknown  . Plavix (Clopidogrel Bisulfate) Other (See Comments)    dizziness  . Prednisone Other (See Comments)    unknown  . Soap Other (See Comments)    unknown  . Sulfamethoxazole Other (See Comments)    unknown  . Tagamet (Cimetidine) Other (See Comments)    unknown  . Talwin Other (See Comments)    unknown  . Tape Other (See Comments)    unknown  . Watermelon Concentrate Other (See Comments)    unknown  . Zithromax (Azithromycin Dihydrate) Other (See Comments)    unknown  . Dyazide (Hydrochlorothiazide W/Triamterene) Rash  . Erythromycin Other (See Comments)    unknown    Patient Measurements: Height: 5\' 4"  (162.6  cm) Weight: 160 lb 11.5 oz (72.9 kg) IBW/kg (Calculated) : 54.7    Vital Signs: Temp: 98.6 F (37 C) (02/19 0500) Temp src: Oral (02/19 0500) BP: 134/78 mmHg (02/19 0500) Pulse Rate: 78  (02/19 0500)  Labs:  Basename 11/02/11 0620 11/01/11 0538 10/31/11 0551  HGB 9.2* 9.3* --  HCT 31.5* 31.2* --  PLT 274 255 --  APTT 27 28 27   LABPROT 20.2* 22.8* 28.0*  INR 1.69* 1.97* 2.57*  HEPARINUNFRC -- -- --  CREATININE 0.98 1.00 1.02  CKTOTAL -- -- --  CKMB -- -- --  TROPONINI -- -- --   Estimated Creatinine Clearance: 38.1 ml/min (by C-G formula based on Cr of 0.98).  Assessment: Resumed Coumadin s/p generator replacement 2.18.13.  INR=1.69 today  Goal of Therapy:  INR 2-3   Plan:  1) Coumadin 6 mg po x 1 dose today 2) Daily INR 3) If home today, recommend resuming home dose with follow up on Friday.  Thank you.  Elwin Sleight 11/02/2011,9:20 AM

## 2011-11-02 NOTE — Discharge Summary (Addendum)
Admit date: 10/28/2011 Discharge date: 11/02/2011  Primary Care Physician:  No primary provider on file.   Discharge Diagnoses:   Active Hospital Problems  Diagnoses Date Noted   . Acute diastolic HF exacerbation. 10/28/2011   . Tachy-brady syndrome 11/01/2011   . Anemia, iron deficiency 11/01/2011   . Diastolic HF (heart failure) 10/28/2011   . Atrial fibrillation 10/12/2011   . Pacemaker-Mdt dual generator exchange.  10/12/2011     Resolved Hospital Problems  Diagnoses Date Noted Date Resolved  . Bradycardia 10/29/2011 11/01/2011  . Hypoxia 10/28/2011 11/01/2011  . Chronic atrial fibrillation  11/01/2011     DISCHARGE MEDICATION: Medication List  As of 11/02/2011 10:48 AM   STOP taking these medications         LEVOXYL 50 MCG tablet      trandolapril 4 MG tablet         TAKE these medications         albuterol 108 (90 BASE) MCG/ACT inhaler   Commonly known as: PROVENTIL HFA;VENTOLIN HFA   Inhale 1 puff into the lungs every 6 (six) hours as needed for wheezing or shortness of breath.      atenolol 25 MG tablet   Commonly known as: TENORMIN   Take 1 tablet (25 mg total) by mouth daily.      cholecalciferol 400 UNITS Tabs   Commonly known as: VITAMIN D   Take 400 Units by mouth daily.      diltiazem 240 MG 24 hr capsule   Commonly known as: CARDIZEM CD   Take 240 mg by mouth daily.      ferrous sulfate 325 (65 FE) MG tablet   Take 1 tablet (325 mg total) by mouth 2 (two) times daily with a meal.      furosemide 20 MG tablet   Commonly known as: LASIX   Take 1 tablet (20 mg total) by mouth every other day.      GENTEAL 0.3 % Soln   Generic drug: Hypromellose   Place 1 drop into both eyes at bedtime.      ICAPS AREDS FORMULA PO   Take 1 capsule by mouth daily.      levothyroxine 75 MCG tablet   Commonly known as: SYNTHROID, LEVOTHROID   Take 37.5 mcg by mouth daily.      oxyCODONE 5 MG immediate release tablet   Commonly known as: Oxy IR/ROXICODONE   Take 5 mg by mouth 3 (three) times daily as needed. For pain      polyvinyl alcohol 1.4 % ophthalmic solution   Commonly known as: LIQUIFILM TEARS   Place 1 drop into both eyes daily as needed.      VOLTAREN 1 % Gel   Generic drug: diclofenac sodium   Place 1 application onto the skin 2 (two) times daily as needed. For knee pain      warfarin 6 MG tablet   Take 1 tablets today. Further dose depending on INR.                  Consults:  Dr Graciela Husbands, cardiologist.   SIGNIFICANT DIAGNOSTIC STUDIES:  Dg Chest 2 View  10/28/2011  *RADIOLOGY REPORT*  Clinical Data: Shortness of breath, history atrial fibrillation, pacemaker placement, hypertension, breast cancer  CHEST - 2 VIEW  Comparison: 10/22/2011  Findings: Left subclavian sequential transvenous pacemaker leads project at right atrium and right ventricle. Significant enlargement of cardiac silhouette. Pulmonary vascular congestion. Enlarged central pulmonary arteries question pulmonary arterial hypertension. Marked  chronic elevation of right diaphragm. Chronic bronchitic changes right basilar atelectasis. No definite acute failure or consolidation. Calcified granuloma medial right lung base. Osseous demineralization with thoracolumbar kyphosis and scoliosis as well as chronic compression deformity of the thoracolumbar vertebrae.  IMPRESSION: Enlargement of cardiac silhouette with pulmonary venous hypertension and suspect pulmonary arterial hypertension. Pacemaker. Chronic elevation right diaphragm with chronic bronchitic changes and right basilar atelectasis. No acute abnormalities.  Original Report Authenticated By: Lollie Marrow, M.D.   Dg Chest Port 1 View  10/22/2011  *RADIOLOGY REPORT*  Clinical Data: Shortness of breath, chest pressure  PORTABLE CHEST - 1 VIEW  Comparison: 11/26/2010  Findings: Left subclavian transvenous pacemaker stable.  Moderate cardiomegaly.  Enlarged central pulmonary arteries.  Some increase in central pulmonary  vascular congestion.  No effusion.  Vascular clips in the right axilla. Regional bones unremarkable.  IMPRESSION:  1.  Stable cardiomegaly and enlarged central pulmonary arteries suggesting pulmonary arterial hypertension. 2.  Some interval increase in central pulmonary vascular congestion.  Original Report Authenticated By: Osa Craver, M.D.     ECHO: Left ventricle: The cavity size was normal. Wall thickness was increased in a pattern of moderate LVH. Systolic function was normal. The estimated ejection fraction was in the range of 60% to 65%. Wall motion was normal; there were no regional wall motion abnormalities. Doppler parameters are consistent with restrictive physiology, indicative of decreased left ventricular diastolic compliance and/or increased left atrial pressure. Doppler parameters are consistent with elevated mean left atrial filling pressure.      Recent Results (from the past 240 hour(s))  SURGICAL PCR SCREEN     Status: Abnormal   Collection Time   10/31/11 11:22 PM      Component Value Range Status Comment   MRSA, PCR NEGATIVE  NEGATIVE  Final    Staphylococcus aureus POSITIVE (*) NEGATIVE  Final     BRIEF ADMITTING H & P: 89yoF with h/o chronic AFib/Coumadin, hypothyroidism, spinal stenosis and  chronic LBP, tachy-brady syndrome s/p PPM sent to ED by PCP for hypoxia.  The patient was seen in Dr. Odessa Fleming office 1/29 with plan to change the  generator on her pacemaker bc it's reached the end of life. However she is  also reporting that for the past 4-5d she's been more breathless, dyspneic,  fatigued with minimal exertion, and her friend notes she can barely get  through a sentence without having to stop for breath. She also notes increased  BLE swelling over same time duration. She denies any orthopneic symptoms. She  denies any chest pain at all.  She was seen in the ED 2/8 but sent home. Saw Dr. Foy Guadalajara today and noted to be  86% on RA, so sent to  the ED.   Hospital Course:  Acute Diastolic HF exacerbation:  Patient presented with hypoxemia, dyspnea, has mild increase BNP, chest x ray with vascular congestion , pulmonary hypertension.  She received IV lasix. She is breathing better. She will be discharge on 20 mg lasix every other day. She received lasix dose today.  Imdur and trandolapril was discontinue due to near syncope event on 2-17. This medication will need to be restarted if BP increases. Patient will need to follow up with her cardiologist for further adjustment of her medications.   Chronic atrial fibrillation  Continue Cardizem, Coumadin. INR 1.6. Her coumadin was on hold for 1 day for pacemaker generator exchange. She will need daily INR. She will need coumadin 6 mg tonight. Adjust dose  as needed.  Coreg change to atenolol by her cardiologist.  Restart coumadin.   Hypoxic respiratory failure: Likely secondary to Pulmonary HTN, Diastolic HF exacerbation. Patient will need evaluation for home oxygen requirement. Continue with albuterol and ipratropium.Marland Kitchen   Hypothyroidism,: Continue with synthroid. TSH at 0.40, Free T 4 1,07. Tachy-brady syndrome; Patient had  exchange of Pacemaker generator on 2-18. Appreciate cardio help.  Anemia; HB at baseline. Anemia iron deficiency. I will start ferrous sulfate. She will need to discuss with PCP colonoscopy ?? And further work up.  Hypokalemia; Replaced. She will need Bmet to follow potasium level now she is on lasix.  Disposition; PT , TO consult. Will pan for discharge today to ALF.          Disposition and Follow-up: Needs INR in 24 hour adjust coumadin as needed.  Discharge Orders    Future Appointments: Provider: Department: Dept Phone: Center:   11/15/2011 2:00 PM [facility] [phone] [facility]     Future Orders Please Complete By Expires   Diet - low sodium heart healthy      Increase activity slowly        Follow-up Information    Follow  up with FRIED, ROBERT L, MD in 1 week.      Follow up with Sherryl Manges, MD in 1 week.   Contact information:   [address] [place] [address] [phone]           DISCHARGE EXAM:   General: Alert, awake, oriented x3, in no acute distress.  HEENT: No bruits, no goiter.  Heart: Regular rate and rhythm, without murmurs, rubs, gallops.  Lungs: CTA, bilateral air movement.  Abdomen: Soft, nontender, nondistended, positive bowel sounds.  Neuro: Grossly intact, nonfocal.  Extremities; trace edema.      Blood pressure 122/76, pulse 92, temperature 98.6 F (37 C), temperature source Oral, resp. rate 18, height 5\' 4"  (1.626 m), weight 72.9 kg (160 lb 11.5 oz), SpO2 94.00%.   Basename 11/02/11 0620 11/01/11 0538  NA 137 141  K 4.5 4.3  CL 103 104  CO2 27 29  GLUCOSE 103* 120*  BUN 20 22  CREATININE 0.98 1.00  CALCIUM 9.1 9.4  MG -- --  PHOS -- --   Basename 11/02/11 0620 11/01/11 0538  WBC 6.1 4.6  NEUTROABS -- --  HGB 9.2* 9.3*  HCT 31.5* 31.2*  MCV 80.8 80.4  PLT 274 255    Signed: REGALADO,BELKYS M.D. 11/02/2011, 10:48 AM  Addendum:  Patient has been seen today 11/03/11 and is cleared for discharge.  Penny Pia MD.

## 2011-11-02 NOTE — Progress Notes (Signed)
OT Cancellation Note  Treatment cancelled today due to pt plans to DC to ALF today.  Will defer OT eval to ALF.Marland Kitchen  Alba Cory 11/02/2011, 12:41 PM

## 2011-11-02 NOTE — Progress Notes (Signed)
.  Clinical social worker completed patient psychosocial assessment, please see assessment in patient shadow chart.  Pt Plans to dc to [facility] for rehab in hopes of returning [facility]. .Clinical social worker initiated skilled nursing facility search, see placement note in patient shadow chart. Pt dc pending pasarr number. .Clinical social worker continuing to follow pt to assist with pt dc plans and further csw needs.    Catha Gosselin, Theresia Majors  [phone] .11/02/2011 15:20pm

## 2011-11-03 LAB — PROTIME-INR
INR: 1.93 — ABNORMAL HIGH (ref 0.00–1.49)
Prothrombin Time: 22.4 seconds — ABNORMAL HIGH (ref 11.6–15.2)

## 2011-11-03 MED ORDER — WARFARIN SODIUM 6 MG PO TABS
6.0000 mg | ORAL_TABLET | Freq: Once | ORAL | Status: DC
  Filled 2011-11-03: qty 1

## 2011-11-03 NOTE — Progress Notes (Signed)
Clinical Social Worker continuing to follow for discharge planning. Pt pasarr received and faxed to [facility]. Clinical Social Worker contacted [facility] and confirmed facility had all of pt clinical information. Clinical Social Worker facilitated pt discharge needs including contacting facility and arranging ambulance transportation to [facility]. No further social work needs at this time. Clinical Social Worker signing off.  Jacklynn Lewis, MSW, LCSWA  Clinical Social Work [phone]

## 2011-11-03 NOTE — Progress Notes (Signed)
ANTICOAGULATION CONSULT NOTE - Follow Up Consult  Pharmacy Consult for Coumadin Indication: atrial fibrillation  Allergies  Allergen Reactions  . Amiodarone Other (See Comments)    dizziness  . Beet Other (See Comments)    unknown  . Benzyl Alc-Guai-Pse Other (See Comments)    unknown  . Caffeine Other (See Comments)    unknown  . Chocolate Other (See Comments)    unknown  . Codeine Phosphate Other (See Comments)    unknown  . Darvon Other (See Comments)    unknown  . Dilaudid (Hydromorphone Hcl) Other (See Comments)    unknown  . Dimetapp Cold-Allergy Other (See Comments)    unknown  . Dust Mite Extract Other (See Comments)    unknown  . Estrogens Other (See Comments)    unknown  . Fungizone (Amphotericin B) Other (See Comments)    unknown  . Iodine Other (See Comments)    unknown  . Librax Other (See Comments)    unknown  . Micardis (Telmisartan) Other (See Comments)    Fatigue and back pain  . Milk (Dairy) Other (See Comments)    unknown  . Mold Extract (Trichophyton Mentagrophyte) Other (See Comments)    unknown  . Motrin (Ibuprofen) Other (See Comments)    unknown  . Penicillins Other (See Comments)    unknown  . Petroleum Jelly (Petrolatum) Other (See Comments)    unknown  . Pineapple Other (See Comments)    unknown  . Plavix (Clopidogrel Bisulfate) Other (See Comments)    dizziness  . Prednisone Other (See Comments)    unknown  . Soap Other (See Comments)    unknown  . Sulfamethoxazole Other (See Comments)    unknown  . Tagamet (Cimetidine) Other (See Comments)    unknown  . Talwin Other (See Comments)    unknown  . Tape Other (See Comments)    unknown  . Watermelon Concentrate Other (See Comments)    unknown  . Zithromax (Azithromycin Dihydrate) Other (See Comments)    unknown  . Dyazide (Hydrochlorothiazide W/Triamterene) Rash  . Erythromycin Other (See Comments)    unknown    Patient Measurements: Height: 5\' 4"  (162.6 cm) Weight: 161  lb 1.6 oz (73.074 kg) IBW/kg (Calculated) : 54.7  Heparin Dosing Weight:    Vital Signs: Temp: 98.7 F (37.1 C) (02/20 0500) Temp src: Oral (02/20 0500) BP: 139/83 mmHg (02/20 0900) Pulse Rate: 92  (02/20 0900)  Labs:  Basename 11/03/11 0615 11/02/11 0620 11/01/11 0538  HGB -- 9.2* 9.3*  HCT -- 31.5* 31.2*  PLT -- 274 255  APTT -- 27 28  LABPROT 22.4* 20.2* 22.8*  INR 1.93* 1.69* 1.97*  HEPARINUNFRC -- -- --  CREATININE -- 0.98 1.00  CKTOTAL -- -- --  CKMB -- -- --  TROPONINI -- -- --   Estimated Creatinine Clearance: 38.2 ml/min (by C-G formula based on Cr of 0.98).   Assessment: Dyspnea  Anticoagulation: CAF on Coumadin PTA. INR=1.69. (Home dose 3mg  MWF and 4.5mg  STTS, admit INR 3.16) S/p PPM dual generator change. INR 1.93 today.  Cardiovascular: diastolic HF, tacy/brady, Afib, HTN, PPM.  Meds- atenolol, diltiazem for rate control; lasix, K Scr 0.98, ?resume Mavic?  Endocrinology: hypothyroidism, osteoporosis.  Meds- synthroid  Gastrointestinal / Nutrition: hx GERD, no meds.  Neurology: hx depression  Nephrology: CrCl~40, scr stable, K = 4.5 today. Adequate UOP yesterday  Pulmonary: hypoxia due to pulm HTN, dyspnea now improving. Albut/iprat. 95% 2L [place]  Hematology / Oncology: hx anemia on FeSo4  Goal of Therapy:  INR 2-3   Plan:  Coumadin 6mg  po x 1 again today. Ok to resume previous home regimen at discharge.  Merilynn Finland, [employer] 11/03/2011,10:03 AM

## 2011-11-03 NOTE — Progress Notes (Signed)
DC orders received.  Patient stable with no S/S of distress. Discharge and medication reviewed with patient.  Report called to [place], RN @ [facility].  Patient DC via ambulance to SNF. Nolon Nations

## 2011-11-15 ENCOUNTER — Ambulatory Visit: Payer: Medicare Other

## 2011-11-18 ENCOUNTER — Encounter: Payer: Self-pay | Admitting: Internal Medicine

## 2011-11-18 ENCOUNTER — Ambulatory Visit (INDEPENDENT_AMBULATORY_CARE_PROVIDER_SITE_OTHER): Payer: Medicare Other | Admitting: *Deleted

## 2011-11-18 DIAGNOSIS — I495 Sick sinus syndrome: Secondary | ICD-10-CM

## 2011-11-18 NOTE — Progress Notes (Signed)
Wound check-PPM 

## 2011-11-26 ENCOUNTER — Observation Stay (HOSPITAL_COMMUNITY)
Admission: EM | Admit: 2011-11-26 | Discharge: 2011-11-27 | Disposition: A | Discharge status: Home / Self Care | Payer: Medicare Other | Source: Ambulatory Visit | Attending: Internal Medicine | Admitting: Internal Medicine

## 2011-11-26 ENCOUNTER — Emergency Department (HOSPITAL_COMMUNITY): Payer: Medicare Other

## 2011-11-26 ENCOUNTER — Encounter (HOSPITAL_COMMUNITY): Payer: Self-pay

## 2011-11-26 DIAGNOSIS — E039 Hypothyroidism, unspecified: Secondary | ICD-10-CM | POA: Insufficient documentation

## 2011-11-26 DIAGNOSIS — Z853 Personal history of malignant neoplasm of breast: Secondary | ICD-10-CM | POA: Insufficient documentation

## 2011-11-26 DIAGNOSIS — Z950 Presence of cardiac pacemaker: Secondary | ICD-10-CM | POA: Insufficient documentation

## 2011-11-26 DIAGNOSIS — K219 Gastro-esophageal reflux disease without esophagitis: Secondary | ICD-10-CM | POA: Insufficient documentation

## 2011-11-26 DIAGNOSIS — I4891 Unspecified atrial fibrillation: Secondary | ICD-10-CM | POA: Insufficient documentation

## 2011-11-26 DIAGNOSIS — I495 Sick sinus syndrome: Secondary | ICD-10-CM

## 2011-11-26 DIAGNOSIS — I10 Essential (primary) hypertension: Secondary | ICD-10-CM | POA: Insufficient documentation

## 2011-11-26 DIAGNOSIS — R079 Chest pain, unspecified: Principal | ICD-10-CM | POA: Insufficient documentation

## 2011-11-26 DIAGNOSIS — Z7901 Long term (current) use of anticoagulants: Secondary | ICD-10-CM | POA: Insufficient documentation

## 2011-11-26 HISTORY — DX: Malignant neoplasm of unspecified site of unspecified female breast: C50.919

## 2011-11-26 HISTORY — DX: Adverse effect of unspecified anesthetic, initial encounter: T41.45XA

## 2011-11-26 HISTORY — DX: Other complications of anesthesia, initial encounter: T88.59XA

## 2011-11-26 HISTORY — DX: Angina pectoris, unspecified: I20.9

## 2011-11-26 HISTORY — DX: Calculus of kidney: N20.0

## 2011-11-26 LAB — BASIC METABOLIC PANEL
BUN: 13 mg/dL (ref 6–23)
CO2: 30 mEq/L (ref 19–32)
Calcium: 9.2 mg/dL (ref 8.4–10.5)
Creatinine, Ser: 0.93 mg/dL (ref 0.50–1.10)
Glucose, Bld: 146 mg/dL — ABNORMAL HIGH (ref 70–99)

## 2011-11-26 LAB — CBC
Hemoglobin: 9.9 g/dL — ABNORMAL LOW (ref 12.0–15.0)
MCH: 26.3 pg (ref 26.0–34.0)
MCHC: 30.9 g/dL (ref 30.0–36.0)
Platelets: 196 10*3/uL (ref 150–400)
RBC: 3.77 MIL/uL — ABNORMAL LOW (ref 3.87–5.11)

## 2011-11-26 LAB — CARDIAC PANEL(CRET KIN+CKTOT+MB+TROPI)
Relative Index: INVALID (ref 0.0–2.5)
Relative Index: INVALID (ref 0.0–2.5)
Total CK: 82 U/L (ref 7–177)
Troponin I: <0.3 ng/mL (ref <0.30)

## 2011-11-26 LAB — DIFFERENTIAL
Basophils Relative: 0 % (ref 0–1)
Eosinophils Absolute: 0.2 10*3/uL (ref 0.0–0.7)
Neutro Abs: 1.6 10*3/uL — ABNORMAL LOW (ref 1.7–7.7)
Neutrophils Relative %: 55 % (ref 43–77)

## 2011-11-26 MED ORDER — NITROGLYCERIN 0.4 MG SL SUBL: 0.4000 mg | SUBLINGUAL_TABLET | SUBLINGUAL | Status: DC | PRN

## 2011-11-26 MED ORDER — CHOLECALCIFEROL 10 MCG (400 UNIT) PO TABS
400.0000 [IU] | ORAL_TABLET | Freq: Every day | ORAL | Status: DC
  Administered 2011-11-27: 400 [IU] via ORAL
  Filled 2011-11-26: qty 1

## 2011-11-26 MED ORDER — WARFARIN SODIUM 3 MG PO TABS
3.0000 mg | ORAL_TABLET | Freq: Every day | ORAL | Status: DC
  Administered 2011-11-26: 3 mg via ORAL
  Filled 2011-11-26 (×2): qty 1

## 2011-11-26 MED ORDER — ASPIRIN 300 MG RE SUPP: 300.0000 mg | RECTAL | Status: AC

## 2011-11-26 MED ORDER — SODIUM CHLORIDE 0.9 % IV SOLN: 250.0000 mL | INTRAVENOUS | Status: DC | PRN

## 2011-11-26 MED ORDER — SODIUM CHLORIDE 0.9 % IJ SOLN: 3.0000 mL | INTRAMUSCULAR | Status: DC | PRN

## 2011-11-26 MED ORDER — FUROSEMIDE 20 MG PO TABS
20.0000 mg | ORAL_TABLET | ORAL | Status: DC
  Administered 2011-11-26: 20 mg via ORAL
  Filled 2011-11-26: qty 1

## 2011-11-26 MED ORDER — ASPIRIN 81 MG PO CHEW: 324.0000 mg | CHEWABLE_TABLET | ORAL | Status: AC

## 2011-11-26 MED ORDER — WARFARIN - PHYSICIAN DOSING INPATIENT: Freq: Every day | Status: DC

## 2011-11-26 MED ORDER — DILTIAZEM HCL ER COATED BEADS 240 MG PO CP24
240.0000 mg | ORAL_CAPSULE | Freq: Every day | ORAL | Status: DC
  Administered 2011-11-27: 240 mg via ORAL
  Filled 2011-11-26: qty 1

## 2011-11-26 MED ORDER — LEVOTHYROXINE SODIUM 75 MCG PO TABS
37.5000 ug | ORAL_TABLET | Freq: Every day | ORAL | Status: DC
  Administered 2011-11-27: 37.5 ug via ORAL
  Filled 2011-11-26 (×2): qty 0.5

## 2011-11-26 MED ORDER — ONDANSETRON HCL 4 MG/2ML IJ SOLN: 4.0000 mg | Freq: Four times a day (QID) | INTRAMUSCULAR | Status: DC | PRN

## 2011-11-26 MED ORDER — ACETAMINOPHEN 325 MG PO TABS: 650.0000 mg | ORAL_TABLET | ORAL | Status: DC | PRN

## 2011-11-26 MED ORDER — SODIUM CHLORIDE 0.9 % IJ SOLN
3.0000 mL | Freq: Two times a day (BID) | INTRAMUSCULAR | Status: DC
  Administered 2011-11-26 – 2011-11-27 (×2): 3 mL via INTRAVENOUS

## 2011-11-26 MED ORDER — ALBUTEROL SULFATE HFA 108 (90 BASE) MCG/ACT IN AERS
1.0000 | INHALATION_SPRAY | Freq: Four times a day (QID) | RESPIRATORY_TRACT | Status: DC | PRN
  Filled 2011-11-26: qty 6.7

## 2011-11-26 NOTE — ED Notes (Signed)
Pt states that she went to bed last night and had a feeling of impending doom. She states that when she woke up the had some substernal chest pressure. Pt recently had pacemaker changed by dr.klein last Monday. Pt did not receive asa due to allergy. Pt received 1 sl nitro and states that the pain was relieved after 1 dose. Pt is very angry because she did not want to be transported to the er but due to facility policy she must be transported when complaints of chest pain are involved. Alert and oriented. Pt resides at [facility].

## 2011-11-26 NOTE — ED Provider Notes (Signed)
11:13 AM  I performed a history and physical examination of [name] and discussed her management with Ms. Trellis Moment PA.  I agree with the history, physical, assessment, and plan of care, with the following exceptions: None  The patient arrives awake, alert, and oriented appropriately, in no apparent distress, breathing easily, without respiratory distress and reporting her chest pain has resolved after nitroglycerin administration by EMS. The patient's lungs are clear to auscultation bilaterally in all fields, heart rate and rhythm is regular, coinciding with a paced rhythm amongst atrial fibrillation/flutter on ECG, and with normal heart sounds. No significant lower extremity edema or pain.  I was present for the following procedures: None Time Spent in Critical Care of the patient: None Time spent in discussions with the patient and family: 7 minutes Ulmer Degen D    Felisa Bonier, MD 11/26/11 1114

## 2011-11-26 NOTE — H&P (Signed)
CARDIOLOGY CONSULT NOTE  Patient ID: [name], MRN: [id number], DOB/AGE: May 18, 1920 78 y.o. Admit date: 11/26/2011 Date of Consult: 11/26/2011  Primary Physician: No primary provider on file. Primary Cardiologist   Chief Complaint: chest pain  HPI [name] is a 78 y.o. female :whom we are asked to see because of chest pain  She had a similar presentation to the ER last April and was discharged with neg enzymes.  No sterss test was done but echo was normal  She has had no significant change in her modest exercise tolerance    This am she awakened with chest pain  simliar to the past (she thinks)  Not assoc with brackish taste  No prior exertional chestpain  I first met  Her jan 2013 for pacemaker generator replacement This was implanted 2004 for tachybradycardia syndrome. She initially had paroxysmal atrial fibrillation treated with sotalol which apparently by history is permanent. She does take warfarin.    She also has a history of hypertension.   She was widowed about a year ago. She has no children and no family. She is quite isolated and has had a very hard year.  seh has moved into her new apt and that is going better  No sob or edema   Past Medical History  Diagnosis Date  . Arthritis   . Chronic atrial fibrillation       chronic coumadin anticoagulation  . Hypertension   . Thyroid disease     hypothyroidism  . Esophageal reflux     with paraesophageal hernia  . Diverticulosis   . Hx of breast cancer     s/p lumpectomy   . Spinal stenosis     with chronic LBP  . Anemia   . Vitamin d deficiency   . Osteoporosis   . UTI (lower urinary tract infection)   . Tachy-brady syndrome   . Chest pain   . Varicose veins   . Fatigue   . History of RIND (reversible ischemic neurological deficit)   . Pacemaker     medtronic generator replacement 2/13 Dual>single  . Hypothyroidism       Surgical History:  Past Surgical History  Procedure Date  .  Tonsillectomy   . Appendectomy   . Tumor removal     ovary  . Breast surgery     rt- lupectomy  . Eye surgery     cataract bilateral  . Insert / replace / remove pacemaker      Home Meds: Prior to Admission medications   Medication Sig Start Date End Date Taking? Authorizing Provider  cholecalciferol (VITAMIN D) 400 UNITS TABS Take 400 Units by mouth daily.   Yes Historical Provider, MD  diclofenac sodium (VOLTAREN) 1 % GEL Place 1 application onto the skin 2 (two) times daily as needed. For knee pain   Yes Historical Provider, MD  diltiazem (CARDIZEM CD) 240 MG 24 hr capsule Take 240 mg by mouth daily.     Yes Historical Provider, MD  furosemide (LASIX) 20 MG tablet Take 1 tablet (20 mg total) by mouth every other day. 11/02/11 11/01/12 Yes Belkys A Regalado, MD  Hypromellose (GENTEAL) 0.3 % SOLN Place 1 drop into both eyes daily.    Yes Historical Provider, MD  levothyroxine (SYNTHROID, LEVOTHROID) 75 MCG tablet Take 37.5 mcg by mouth daily.   Yes Historical Provider, MD  Multiple Vitamins-Minerals (ICAPS AREDS FORMULA PO) Take 1 capsule by mouth daily.   Yes Historical Provider, MD  oxyCODONE (  OXY IR/ROXICODONE) 5 MG immediate release tablet Take 5 mg by mouth 3 (three) times daily as needed. For pain   Yes Historical Provider, MD  warfarin (COUMADIN) 3 MG tablet Take 3 mg by mouth daily. 1 tab on Monday, Wednesday and Friday and 1.5 tablets all other days.   Yes Historical Provider, MD  albuterol (PROVENTIL HFA;VENTOLIN HFA) 108 (90 BASE) MCG/ACT inhaler Inhale 1 puff into the lungs every 6 (six) hours as needed for wheezing or shortness of breath. 11/02/11 11/01/12  Alba Cory, MD    Inpatient Medications:     Allergies:  Allergies  Allergen Reactions  . Amiodarone Other (See Comments)    dizziness  . Beet Other (See Comments)    unknown  . Benzyl Alc-Guai-Pse Other (See Comments)    unknown  . Caffeine Other (See Comments)    unknown  . Chocolate Other (See Comments)      unknown  . Codeine Phosphate Other (See Comments)    unknown  . Darvon Other (See Comments)    unknown  . Dilaudid (Hydromorphone Hcl) Other (See Comments)    unknown  . Dimetapp Cold-Allergy Other (See Comments)    unknown  . Dust Mite Extract Other (See Comments)    unknown  . Estrogens Other (See Comments)    unknown  . Fungizone (Amphotericin B) Other (See Comments)    unknown  . Iodine Other (See Comments)    unknown  . Librax Other (See Comments)    unknown  . Micardis (Telmisartan) Other (See Comments)    Fatigue and back pain  . Milk (Dairy) Other (See Comments)    unknown  . Mold Extract (Trichophyton Mentagrophyte) Other (See Comments)    unknown  . Motrin (Ibuprofen) Other (See Comments)    unknown  . Penicillins Other (See Comments)    unknown  . Petroleum Jelly (Petrolatum) Other (See Comments)    unknown  . Pineapple Other (See Comments)    unknown  . Plavix (Clopidogrel Bisulfate) Other (See Comments)    dizziness  . Prednisone Other (See Comments)    unknown  . Soap Other (See Comments)    unknown  . Sulfamethoxazole Other (See Comments)    unknown  . Tagamet (Cimetidine) Other (See Comments)    unknown  . Talwin Other (See Comments)    unknown  . Tape Other (See Comments)    unknown  . Watermelon Concentrate Other (See Comments)    unknown  . Zithromax (Azithromycin Dihydrate) Other (See Comments)    unknown  . Dyazide (Hydrochlorothiazide W/Triamterene) Rash  . Erythromycin Other (See Comments)    unknown    History   Social History  . Marital Status: Married    Spouse Name: N/A    Number of Children: N/A  . Years of Education: N/A   Occupational History  . Not on file.   Social History Main Topics  . Smoking status: Never Smoker   . Smokeless tobacco: Never Used  . Alcohol Use: No  . Drug Use: No  . Sexually Active: No   Other Topics Concern  . Not on file   Social History Narrative   Contact person Derwood Kaplan  [phone], [phone], she is a good friend. Husband passed away June 22, 2011 and pt moved into an apartment, now lives by herself in [place]. No kids. Not many friends or family support.      Family History  Problem Relation Age of Onset  . Cancer Sister  ROS:  Please see the history of present illness  All other systems reviewed and negative.    Physical Exam:  Blood pressure 116/76, pulse 79, temperature 98 F (36.7 C), temperature source Oral, resp. rate 22, SpO2 96.00%. General: Well developed, well nourished female in no acute distress. Head: Normocephalic, atraumatic, sclera non-icteric, no xanthomas, nares are without discharge. Lymph Nodes:  none Neck: Negative for carotid bruits. JVD not elevated. Lungs: Clear bilaterally to auscultation without wheezes, rales, or rhonchi. Breathing is unlabored Chest.withour CVAT  And pacer pocket wellhealed Heart: Irr Irr with S1 S2. No murmurs, rubs, or gallops appreciated. Abdomen: Soft, non-tender, non-distended with normoactive bowel sounds. No hepatomegaly. No rebound/guarding. No obvious abdominal masses. Msk:  Strength and tone appear normal for age. Extremities: No clubbing or cyanosis. No edema.  Distal pedal pulses are 2+ and equal bilaterally. Skin: Warm and Dry Neuro: Alert and oriented X 3. CN III-XII intact Grossly normal sensory and motor function . Psych:  Responds to questions appropriately with a normal affect.      Labs: Cardiac Enzymes No results found for this basename: CKTOTAL:4,CKMB:4,TROPONINI:4 in the last 72 hours CBC Lab Results  Component Value Date   WBC 2.9* 11/26/2011   HGB 9.9* 11/26/2011   HCT 32.0* 11/26/2011   MCV 84.9 11/26/2011   PLT 196 11/26/2011   PROTIME:  Basename 11/26/11 1200  LABPROT 30.9*  INR 2.91*   Chemistry  Lab 11/26/11 1200  NA 141  K 4.5  CL 103  CO2 30  BUN 13  CREATININE 0.93  CALCIUM 9.2  PROT --  BILITOT --  ALKPHOS --  ALT --  AST --  GLUCOSE  146*   Lipids Lab Results  Component Value Date   CHOL  Value: 241        ATP III CLASSIFICATION:  <200     mg/dL   Desirable  098-119  mg/dL   Borderline High  >=147    mg/dL   High       * 05/11/5620   HDL 47 11/27/2010   LDLCALC  Value: 170        Total Cholesterol/HDL:CHD Risk Coronary Heart Disease Risk Table                     Men   Women  1/2 Average Risk   3.4   3.3  Average Risk       5.0   4.4  2 X Average Risk   9.6   7.1  3 X Average Risk  23.4   11.0        Use the calculated Patient Ratio above and the CHD Risk Table to determine the patient's CHD Risk.        ATP III CLASSIFICATION (LDL):  <100     mg/dL   Optimal  308-657  mg/dL   Near or Above                    Optimal  130-159  mg/dL   Borderline  846-962  mg/dL   High  >952     mg/dL   Very High* 8/41/3244   TRIG 118 11/27/2010   BNP Pro B Natriuretic peptide (BNP)  Date/Time Value Range Status  10/28/2011  4:34 PM 2930.0* 0-450 (pg/mL) Final   Miscellaneous Lab Results  Component Value Date   DDIMER 0.46 10/28/2011    Radiology/Studies:  Dg Chest 2 View  11/26/2011  *RADIOLOGY REPORT*  Clinical Data:  Chest pain shortness of breath and weakness.  CHEST - 2 VIEW  Comparison: Chest x-ray 10/28/2011.  Findings: There continues to be chronic elevation of the right hemidiaphragm (unchanged).  Linear opacity in the right mid lung may reflect an area of scarring (unchanged).  No definite focal airspace consolidation.  No pleural effusions.  Mild - moderate cardiomegaly, unchanged. Prominent central pulmonary arteries is again noted, suggestive of pulmonary arterial hypertension. Tortuosity of the descending thoracic aorta.  Left-sided pacemaker device in place with lead tips projecting over the expected location of the right atrium and right ventricular apex.  IMPRESSION:  1.  Radiographic appearance of the chest is essentially unchanged, as above, without definite findings to suggest acute cardiopulmonary disease.  Original Report  Authenticated By: Florencia Reasons, M.D.   Dg Chest 2 View  10/28/2011  *RADIOLOGY REPORT*  Clinical Data: Shortness of breath, history atrial fibrillation, pacemaker placement, hypertension, breast cancer  CHEST - 2 VIEW  Comparison: 10/22/2011  Findings: Left subclavian sequential transvenous pacemaker leads project at right atrium and right ventricle. Significant enlargement of cardiac silhouette. Pulmonary vascular congestion. Enlarged central pulmonary arteries question pulmonary arterial hypertension. Marked chronic elevation of right diaphragm. Chronic bronchitic changes right basilar atelectasis. No definite acute failure or consolidation. Calcified granuloma medial right lung base. Osseous demineralization with thoracolumbar kyphosis and scoliosis as well as chronic compression deformity of the thoracolumbar vertebrae.  IMPRESSION: Enlargement of cardiac silhouette with pulmonary venous hypertension and suspect pulmonary arterial hypertension. Pacemaker. Chronic elevation right diaphragm with chronic bronchitic changes and right basilar atelectasis. No acute abnormalities.  Original Report Authenticated By: Lollie Marrow, M.D.    EKG: afib 74, nsst   Assessment and Plan:   Chest pain--atypical and typical features  With neg enzymes and low risk features  Admitted a year ago for similar complaints.  prob reasonable in this vigorous older woman to under take myoview scan and then discharge   Will continue meds and anticpate d/c in am   Sherryl Manges

## 2011-11-26 NOTE — ED Provider Notes (Signed)
History     CSN: [id number]  Arrival date & time 11/26/11  1053   First MD Initiated Contact with Patient 11/26/11 1058      Chief Complaint  Patient presents with  . Chest Pain    (Consider location/radiation/quality/duration/timing/severity/associated sxs/prior treatment) Patient is a 78 y.o. female presenting with chest pain. The history is provided by the patient.  Chest Pain The chest pain began 1 - 2 hours ago. Chest pain occurs constantly. The chest pain is resolved.   Pt states she was sitting on a couch this morning when developed chest "pressure." states she continued to have pressure, so called 911. EMS given her nitroglycerin which relieved her pain. No asa given bc of allergy. Pt denies SOB, nausea, dizziness, diaphoresis. States currently pain free, and feels "well."  No other complaints. Pt has also recently was hospitalized for a pacemaker replacement. No problems with pacemaker.  Past Medical History  Diagnosis Date  . Arthritis   . Chronic atrial fibrillation       chronic coumadin anticoagulation  . Hypertension   . Thyroid disease     hypothyroidism  . Esophageal reflux     with paraesophageal hernia  . Diverticulosis   . Hx of breast cancer     s/p lumpectomy   . Spinal stenosis     with chronic LBP  . Anemia   . Vitamin d deficiency   . Osteoporosis   . UTI (lower urinary tract infection)   . Tachy-brady syndrome   . Chest pain   . Varicose veins   . Fatigue   . History of RIND (reversible ischemic neurological deficit)   . Pacemaker     medtronic generator replacement 2/13 Dual>single  . Hypothyroidism     Past Surgical History  Procedure Date  . Tonsillectomy   . Appendectomy   . Tumor removal     ovary  . Breast surgery     rt- lupectomy  . Eye surgery     cataract bilateral  . Insert / replace / remove pacemaker     Family History  Problem Relation Age of Onset  . Cancer Sister     History  Substance Use Topics  . Smoking  status: Never Smoker   . Smokeless tobacco: Never Used  . Alcohol Use: No    OB History    Grav Para Term Preterm Abortions TAB SAB Ect Mult Living                  Review of Systems  Cardiovascular: Positive for chest pain.    Allergies  Amiodarone; Beet; Benzyl alc-guai-pse; Caffeine; Chocolate; Codeine phosphate; Darvon; Dilaudid; Dimetapp cold-allergy; Dust mite extract; Estrogens; Fungizone; Iodine; Librax; Micardis; Milk; Mold extract; Motrin; Penicillins; Petroleum jelly; Pineapple; Plavix; Prednisone; Soap; Sulfamethoxazole; Tagamet; Talwin; Tape; Watermelon concentrate; Zithromax; Dyazide; and Erythromycin  Home Medications   Current Outpatient Rx  Name Route Sig Dispense Refill  . ALBUTEROL SULFATE HFA 108 (90 BASE) MCG/ACT IN AERS Inhalation Inhale 1 puff into the lungs every 6 (six) hours as needed for wheezing or shortness of breath. 1 Inhaler 0  . ATENOLOL 25 MG PO TABS Oral Take 1 tablet (25 mg total) by mouth daily. 30 tablet 0  . CHOLECALCIFEROL 400 UNITS PO TABS Oral Take 400 Units by mouth daily.    Marland Kitchen DICLOFENAC SODIUM 1 % TD GEL Transdermal Place 1 application onto the skin 2 (two) times daily as needed. For knee pain    . DILTIAZEM HCL  ER COATED BEADS 240 MG PO CP24 Oral Take 240 mg by mouth daily.      Marland Kitchen FERROUS SULFATE 325 (65 FE) MG PO TABS Oral Take 1 tablet (325 mg total) by mouth 2 (two) times daily with a meal. 60 tablet 0  . FUROSEMIDE 20 MG PO TABS Oral Take 1 tablet (20 mg total) by mouth every other day. 30 tablet 0  . HYPROMELLOSE 0.3 % OP SOLN Both Eyes Place 1 drop into both eyes at bedtime.     Marland Kitchen LEVOTHYROXINE SODIUM 75 MCG PO TABS Oral Take 37.5 mcg by mouth daily.    . ICAPS AREDS FORMULA PO Oral Take 1 capsule by mouth daily.    . OXYCODONE HCL 5 MG PO TABS Oral Take 5 mg by mouth 3 (three) times daily as needed. For pain    . POLYVINYL ALCOHOL 1.4 % OP SOLN Both Eyes Place 1 drop into both eyes daily as needed.    . WARFARIN SODIUM 6 MG PO  TABS Oral Take 1 tablet (6 mg total) by mouth one time only at 6 PM. Further doses depending on INR level. 30 tablet 0    BP 100/48  Pulse 72  Temp(Src) 98.8 F (37.1 C) (Oral)  Resp 19  SpO2 97%  Physical Exam  Nursing note and vitals reviewed. Constitutional: She is oriented to person, place, and time. She appears well-developed and well-nourished.  HENT:  Head: Normocephalic.  Eyes: Conjunctivae are normal.  Neck: Neck supple.  Cardiovascular: Normal rate, regular rhythm and normal heart sounds.   Pulmonary/Chest: Effort normal and breath sounds normal. No respiratory distress.       Scar from a pacemaker appears normal, healing  Abdominal: Soft. Bowel sounds are normal. She exhibits no distension.  Musculoskeletal: Normal range of motion.       Trace LE edema bilaterally  Neurological: She is alert and oriented to person, place, and time.  Skin: Skin is warm and dry. No erythema.  Psychiatric: She has a normal mood and affect.    ED Course  Procedures (including critical care time)  Pt with CP pressure that is releived with nitroglycerin. Pt is currently CP free. Unable to find her last cath in her chart. Pt denies ever having stents or bypass as far as she knows. States she did have a septal defect surgery "years ago." Will get labs and monitor.   Results for orders placed during the hospital encounter of 11/26/11  CBC      Component Value Range   WBC 2.9 (*) 4.0 - 10.5 (K/uL)   RBC 3.77 (*) 3.87 - 5.11 (MIL/uL)   Hemoglobin 9.9 (*) 12.0 - 15.0 (g/dL)   HCT 16.1 (*) 09.6 - 46.0 (%)   MCV 84.9  78.0 - 100.0 (fL)   MCH 26.3  26.0 - 34.0 (pg)   MCHC 30.9  30.0 - 36.0 (g/dL)   RDW 04.5 (*) 40.9 - 15.5 (%)   Platelets 196  150 - 400 (K/uL)  DIFFERENTIAL      Component Value Range   Neutrophils Relative 55  43 - 77 (%)   Neutro Abs 1.6 (*) 1.7 - 7.7 (K/uL)   Lymphocytes Relative 26  12 - 46 (%)   Lymphs Abs 0.8  0.7 - 4.0 (K/uL)   Monocytes Relative 14 (*) 3 - 12 (%)    Monocytes Absolute 0.4  0.1 - 1.0 (K/uL)   Eosinophils Relative 5  0 - 5 (%)   Eosinophils Absolute 0.2  0.0 - 0.7 (K/uL)   Basophils Relative 0  0 - 1 (%)   Basophils Absolute 0.0  0.0 - 0.1 (K/uL)  BASIC METABOLIC PANEL      Component Value Range   Sodium 141  135 - 145 (mEq/L)   Potassium 4.5  3.5 - 5.1 (mEq/L)   Chloride 103  96 - 112 (mEq/L)   CO2 30  19 - 32 (mEq/L)   Glucose, Bld 146 (*) 70 - 99 (mg/dL)   BUN 13  6 - 23 (mg/dL)   Creatinine, Ser 1.61  0.50 - 1.10 (mg/dL)   Calcium 9.2  8.4 - 09.6 (mg/dL)   GFR calc non Af Amer 53 (*) >90 (mL/min)   GFR calc Af Amer 61 (*) >90 (mL/min)  PROTIME-INR      Component Value Range   Prothrombin Time 30.9 (*) 11.6 - 15.2 (seconds)   INR 2.91 (*) 0.00 - 1.49   POCT I-STAT TROPONIN I      Component Value Range   Troponin i, poc 0.01  0.00 - 0.08 (ng/mL)   Comment 3            Dg Chest 2 View  11/26/2011  *RADIOLOGY REPORT*  Clinical Data: Chest pain shortness of breath and weakness.  CHEST - 2 VIEW  Comparison: Chest x-ray 10/28/2011.  Findings: There continues to be chronic elevation of the right hemidiaphragm (unchanged).  Linear opacity in the right mid lung may reflect an area of scarring (unchanged).  No definite focal airspace consolidation.  No pleural effusions.  Mild - moderate cardiomegaly, unchanged. Prominent central pulmonary arteries is again noted, suggestive of pulmonary arterial hypertension. Tortuosity of the descending thoracic aorta.  Left-sided pacemaker device in place with lead tips projecting over the expected location of the right atrium and right ventricular apex.  IMPRESSION:  1.  Radiographic appearance of the chest is essentially unchanged, as above, without definite findings to suggest acute cardiopulmonary disease.  Original Report Authenticated By: Florencia Reasons, M.D.   Dg Chest 2 View  10/28/2011  *RADIOLOGY REPORT*  Clinical Data: Shortness of breath, history atrial fibrillation, pacemaker  placement, hypertension, breast cancer  CHEST - 2 VIEW  Comparison: 10/22/2011  Findings: Left subclavian sequential transvenous pacemaker leads project at right atrium and right ventricle. Significant enlargement of cardiac silhouette. Pulmonary vascular congestion. Enlarged central pulmonary arteries question pulmonary arterial hypertension. Marked chronic elevation of right diaphragm. Chronic bronchitic changes right basilar atelectasis. No definite acute failure or consolidation. Calcified granuloma medial right lung base. Osseous demineralization with thoracolumbar kyphosis and scoliosis as well as chronic compression deformity of the thoracolumbar vertebrae.  IMPRESSION: Enlargement of cardiac silhouette with pulmonary venous hypertension and suspect pulmonary arterial hypertension. Pacemaker. Chronic elevation right diaphragm with chronic bronchitic changes and right basilar atelectasis. No acute abnormalities.  Original Report Authenticated By: Lollie Marrow, M.D.     Date: 11/26/2011  Rate: 74  Rhythm: atrial fibrillation ventricular pacing  QRS Axis:   Intervals:  ST/T Wave abnormalities: nonspecific T wave changes  Conduction Disutrbances:none  Narrative Interpretation:   Old EKG Reviewed: changes noted new paced rhythm  Pt stats she does not see [facility] any more, but uses [place]. Will call [address] cardiology to consult/admit.     No diagnosis found.    MDM          Lottie Mussel, PA 11/26/11 [phone]

## 2011-11-26 NOTE — ED Notes (Signed)
Nurse unable to take report at this time.

## 2011-11-27 ENCOUNTER — Observation Stay (HOSPITAL_COMMUNITY): Payer: Medicare Other

## 2011-11-27 DIAGNOSIS — R079 Chest pain, unspecified: Secondary | ICD-10-CM

## 2011-11-27 LAB — CARDIAC PANEL(CRET KIN+CKTOT+MB+TROPI)
CK, MB: 3.2 ng/mL (ref 0.3–4.0)
Relative Index: INVALID (ref 0.0–2.5)
Total CK: 89 U/L (ref 7–177)
Troponin I: <0.3 ng/mL (ref <0.30)

## 2011-11-27 MED ORDER — WARFARIN SODIUM 3 MG PO TABS: 3.0000 mg | ORAL_TABLET | Freq: Every day | ORAL | Status: DC

## 2011-11-27 MED ORDER — TECHNETIUM TC 99M TETROFOSMIN IV KIT
30.0000 | PACK | Freq: Once | INTRAVENOUS | Status: AC | PRN
  Administered 2011-11-27: 30 via INTRAVENOUS

## 2011-11-27 MED ORDER — TECHNETIUM TC 99M TETROFOSMIN IV KIT
10.0000 | PACK | Freq: Once | INTRAVENOUS | Status: AC | PRN
  Administered 2011-11-27: 10 via INTRAVENOUS

## 2011-11-27 MED ORDER — REGADENOSON 0.4 MG/5ML IV SOLN
0.4000 mg | Freq: Once | INTRAVENOUS | Status: AC
  Administered 2011-11-27: 0.4 mg via INTRAVENOUS

## 2011-11-27 NOTE — Discharge Summary (Signed)
Patient ID: [name],  MRN: [id number], DOB/AGE: [birth year] 76 y.o.  Admit date: 11/26/2011 Discharge date: 11/27/2011  Primary Care Provider: R. Foy Guadalajara, MD Primary Cardiologist: Odessa Fleming, MD  Discharge Diagnoses Principal Problem:  *Midsternal chest pain Active Problems:  Atrial fibrillation  Tachy-brady syndrome   Allergies Allergies  Allergen Reactions  . Amiodarone Other (See Comments)    dizziness  . Beet Other (See Comments)    unknown  . Benzyl Alc-Guai-Pse Other (See Comments)    unknown  . Caffeine Other (See Comments)    unknown  . Chocolate Other (See Comments)    unknown  . Codeine Phosphate Other (See Comments)    unknown  . Darvon Other (See Comments)    unknown  . Dilaudid (Hydromorphone Hcl) Other (See Comments)    unknown  . Dimetapp Cold-Allergy Other (See Comments)    unknown  . Dust Mite Extract Other (See Comments)    unknown  . Estrogens Other (See Comments)    unknown  . Fungizone (Amphotericin B) Other (See Comments)    unknown  . Iodine Other (See Comments)    unknown  . Librax Other (See Comments)    unknown  . Micardis (Telmisartan) Other (See Comments)    Fatigue and back pain  . Milk (Dairy) Other (See Comments)    unknown  . Mold Extract (Trichophyton Mentagrophyte) Other (See Comments)    unknown  . Motrin (Ibuprofen) Other (See Comments)    unknown  . Penicillins Other (See Comments)    unknown  . Petroleum Jelly (Petrolatum) Other (See Comments)    unknown  . Pineapple Other (See Comments)    unknown  . Plavix (Clopidogrel Bisulfate) Other (See Comments)    dizziness  . Prednisone Other (See Comments)    unknown  . Soap Other (See Comments)    unknown  . Sulfamethoxazole Other (See Comments)    unknown  . Tagamet (Cimetidine) Other (See Comments)    unknown  . Talwin Other (See Comments)    unknown  . Tape Other (See Comments)    unknown  . Watermelon Concentrate Other (See Comments)    unknown  .  Zithromax (Azithromycin Dihydrate) Other (See Comments)    unknown  . Dyazide (Hydrochlorothiazide W/Triamterene) Rash  . Erythromycin Other (See Comments)    unknown    Procedures  3.16.2013 Lexiscan Myoview  IMPRESSION:  1.  No reversible ischemia or infarction. 2.  Septal hypokinesia. 3.  Left ventricular ejection fraction equal 66% __________________________________  History of Present Illness  78 y/o female with the above problem list who was in her usoh until the day of admission when she presented to the [facility] ED with complaints of chest pain.  ECG showed no acute ST/T changes and CE were negative.  She was admitted for further evaluation.  Hospital Course  Following admission, pt had no further chest pain.  She r/o for MI.  She underwent Lexiscan myoview this AM, which showed no evidence of ischemia.  She will be discharged home today in good condition.  Discharge Vitals Blood pressure 128/64, pulse 94, temperature 98.1 F (36.7 C), temperature source Oral, resp. rate 18, height 5\' 6"  (1.676 m), weight 157 lb 11.2 oz (71.532 kg), SpO2 92.00%.  Filed Weights   11/26/11 1913 11/27/11 0430  Weight: 160 lb 7.9 oz (72.8 kg) 157 lb 11.2 oz (71.532 kg)   Labs  CBC  Basename 11/26/11 1200  WBC 2.9*  NEUTROABS 1.6*  HGB 9.9*  HCT 32.0*  MCV 84.9  PLT 196   Basic Metabolic Panel  Basename 11/26/11 1200  NA 141  K 4.5  CL 103  CO2 30  GLUCOSE 146*  BUN 13  CREATININE 0.93  CALCIUM 9.2  MG --  PHOS --   Cardiac Enzymes  Basename 11/27/11 0745 11/26/11 2126 11/26/11 1550  CKTOTAL 89 91 82  CKMB 3.2 3.1 2.9  CKMBINDEX -- -- --  TROPONINI <0.30 <0.30 <0.30   Disposition  Pt is being discharged home today in good condition.  Follow-up Plans & Appointments  Follow-up Information    Follow up with FRIED, Doris Cheadle, MD. (Within 1 week for INR check)    Contact information:   [address] [place] [address] [phone]       Follow up with Sherryl Manges, MD. (3-4 wks.  We will arrange.)    Contact information:   [address], [address] [place] [address] [phone]         Discharge Medications  Medication List  As of 11/27/2011  2:37 PM   TAKE these medications         albuterol 108 (90 BASE) MCG/ACT inhaler   Commonly known as: PROVENTIL HFA;VENTOLIN HFA   Inhale 1 puff into the lungs every 6 (six) hours as needed for wheezing or shortness of breath.      cholecalciferol 400 UNITS Tabs   Commonly known as: VITAMIN D   Take 400 Units by mouth daily.      diltiazem 240 MG 24 hr capsule   Commonly known as: CARDIZEM CD   Take 240 mg by mouth daily.      furosemide 20 MG tablet   Commonly known as: LASIX   Take 1 tablet (20 mg total) by mouth every other day.      GENTEAL 0.3 % Soln   Generic drug: Hypromellose   Place 1 drop into both eyes daily.      ICAPS AREDS FORMULA PO   Take 1 capsule by mouth daily.      levothyroxine 75 MCG tablet   Commonly known as: SYNTHROID, LEVOTHROID   Take 37.5 mcg by mouth daily.      oxyCODONE 5 MG immediate release tablet   Commonly known as: Oxy IR/ROXICODONE   Take 5 mg by mouth 3 (three) times daily as needed. For pain      VOLTAREN 1 % Gel   Generic drug: diclofenac sodium   Place 1 application onto the skin 2 (two) times daily as needed. For knee pain      warfarin 3 MG tablet   Commonly known as: COUMADIN   Take 1 tablet (3 mg total) by mouth daily.           Outstanding Labs/Studies  None  Duration of Discharge Encounter   Greater than 30 minutes including physician time.  Signed, Nicolasa Ducking NP 11/27/2011, 2:37 PM

## 2011-11-27 NOTE — Progress Notes (Addendum)
Patient Name: [name] Date of Encounter: 11/27/2011     Principal Problem:  *Midsternal chest pain Active Problems:  Atrial fibrillation  Tachy-brady syndrome    SUBJECTIVE  No chest pain or sob overnight.  CE negative.  For myoview today.  CURRENT MEDS    . aspirin  324 mg Oral NOW   Or  . aspirin  300 mg Rectal NOW  . cholecalciferol  400 Units Oral Daily  . diltiazem  240 mg Oral Daily  . furosemide  20 mg Oral QODAY  . levothyroxine  37.5 mcg Oral Q0600  . regadenoson  0.4 mg Intravenous Once  . sodium chloride  3 mL Intravenous Q12H  . warfarin  3 mg Oral Daily  . Warfarin - Physician Dosing Inpatient   Does not apply q1800    OBJECTIVE  Filed Vitals:   11/26/11 1715 11/26/11 1913 11/26/11 2154 11/27/11 0430  BP: 145/82 162/88 134/56 132/83  Pulse: 85 83 88 94  Temp:  97.8 F (36.6 C) 98 F (36.7 C) 98.1 F (36.7 C)  TempSrc:  Oral Oral Oral  Resp: 18 20 18 18   Height:  5\' 6"  (1.676 m)    Weight:  160 lb 7.9 oz (72.8 kg)  157 lb 11.2 oz (71.532 kg)  SpO2: 96% 94% 95% 92%    Intake/Output Summary (Last 24 hours) at 11/27/11 0959 Last data filed at 11/27/11 0830  Gross per 24 hour  Intake    120 ml  Output   1900 ml  Net  -1780 ml   Filed Weights   11/26/11 1913 11/27/11 0430  Weight: 160 lb 7.9 oz (72.8 kg) 157 lb 11.2 oz (71.532 kg)    PHYSICAL EXAM  General: Pleasant, NAD. Neuro: Alert and oriented X 3. Moves all extremities spontaneously. Psych: Normal affect. HEENT:  Normal  Neck: Supple without bruits or JVD. Lungs:  Resp regular and unlabored, CTA. Heart: RRR no s3, s4, or murmurs. Abdomen: Soft, non-tender, non-distended, BS + x 4.  Extremities: No clubbing, cyanosis or edema. DP/PT/Radials 2+ and equal bilaterally.  Accessory Clinical Findings  CBC  Basename 11/26/11 1200  WBC 2.9*  NEUTROABS 1.6*  HGB 9.9*  HCT 32.0*  MCV 84.9  PLT 196   Basic Metabolic Panel  Basename 11/26/11 1200  NA 141  K 4.5    CL 103  CO2 30  GLUCOSE 146*  BUN 13  CREATININE 0.93  CALCIUM 9.2  MG --  PHOS --   Cardiac Enzymes  Basename 11/27/11 0745 11/26/11 2126 11/26/11 1550  CKTOTAL 89 91 82  CKMB 3.2 3.1 2.9  CKMBINDEX -- -- --  TROPONINI <0.30 <0.30 <0.30   Lab Results  Component Value Date   INR 3.34* 11/27/2011   INR 2.91* 11/26/2011   INR 1.93* 11/03/2011     TELE  Strip in chart reviewed (pt seen in nuc med) - afib, rate controlled  Radiology/Studies  Dg Chest 2 View  11/26/2011  *RADIOLOGY REPORT*  Clinical Data: Chest pain shortness of breath and weakness.  CHEST - 2 VIEW  Comparison: Chest x-ray 10/28/2011.  Findings: There continues to be chronic elevation of the right hemidiaphragm (unchanged).  Linear opacity in the right mid lung may reflect an area of scarring (unchanged).  No definite focal airspace consolidation.  No pleural effusions.  Mild - moderate cardiomegaly, unchanged. Prominent central pulmonary arteries is again noted, suggestive of pulmonary arterial hypertension. Tortuosity of the descending thoracic aorta.  Left-sided pacemaker device in place  with lead tips projecting over the expected location of the right atrium and right ventricular apex.  IMPRESSION:  1.  Radiographic appearance of the chest is essentially unchanged, as above, without definite findings to suggest acute cardiopulmonary disease.  Original Report Authenticated By: Florencia Reasons, M.D.   ASSESSMENT AND PLAN  1.  Midsternal Chest pain:  Resolved.  CE negative.  Myoview today.  Home if normal.  2.  Afib:  On chronic coumadin.  Cont Dilt.  Rate controlled.  INR supratherapeutic this am.  Signed, Nicolasa Ducking NP  Cardiology Attending  Patient seen and examineded. He myoview stress test demonstrated no ischemia. Agree with plans as above. Lewayne Bunting, M.D.

## 2011-11-27 NOTE — Discharge Instructions (Signed)
***  PLEASE REMEMBER TO BRING ALL OF YOUR MEDICATIONS TO EACH OF YOUR FOLLOW-UP OFFICE VISITS.  

## 2011-12-09 NOTE — ED Provider Notes (Signed)
Evaluation and management procedures were performed by the PA/NP under my supervision/collaboration.   Dashanique Brownstein D Amiliana Foutz, MD 12/09/11 1024 

## 2012-01-04 ENCOUNTER — Encounter: Payer: Self-pay | Admitting: Internal Medicine

## 2012-01-04 ENCOUNTER — Ambulatory Visit (INDEPENDENT_AMBULATORY_CARE_PROVIDER_SITE_OTHER): Payer: Medicare Other | Admitting: Internal Medicine

## 2012-01-04 VITALS — BP 165/89 | HR 84 | Ht 64.0 in | Wt 165.1 lb

## 2012-01-04 DIAGNOSIS — I5030 Unspecified diastolic (congestive) heart failure: Secondary | ICD-10-CM

## 2012-01-04 DIAGNOSIS — Z950 Presence of cardiac pacemaker: Secondary | ICD-10-CM

## 2012-01-04 DIAGNOSIS — I10 Essential (primary) hypertension: Secondary | ICD-10-CM

## 2012-01-04 DIAGNOSIS — I4891 Unspecified atrial fibrillation: Secondary | ICD-10-CM

## 2012-01-04 LAB — PACEMAKER DEVICE OBSERVATION
BATTERY VOLTAGE: 2.78 V
BMOD-0003RV: 30
BMOD-0005RV: 95 {beats}/min
RV LEAD IMPEDENCE PM: 650 Ohm
VENTRICULAR PACING PM: 4

## 2012-01-04 NOTE — Assessment & Plan Note (Signed)
Blood pressure is elevated today. Reviewing the old chart is not typically been this high. I've asked her again it measured at the facility where she lives and it remains elevated to follow up with Dr. freed, her PCP

## 2012-01-04 NOTE — Assessment & Plan Note (Signed)
Permanent, on warfarin

## 2012-01-04 NOTE — Progress Notes (Signed)
HPI  [name] is a 78 y.o. female Seen in followup for a pacemaker changed out in January 2013 initially implanted 2004 for tachybradycardia syndrome. Pacer site well healed   She initially had paroxysmal atrial fibrillation treated with sotalol which apparently by history is permanent. She does take warfarin.  She also has a history of hypertension.  She was widowed about a year ago. She has no children and no family. She is quite isolated and has had a  very hard year.  She has tried differnet living situations but this has not helped much  The patient denies chest pain, shortness of breath, nocturnal dyspnea, orthopnea or peripheral edema.  There have been no palpitations, lightheadedness or syncope.   Past Medical History  Diagnosis Date  . Arthritis   . Chronic atrial fibrillation       chronic coumadin anticoagulation  . Hypertension   . Esophageal reflux     with paraesophageal hernia  . Diverticulosis   . Spinal stenosis     with chronic LBP  . Anemia   . Vitamin d deficiency   . Osteoporosis   . UTI (lower urinary tract infection)   . Tachy-brady syndrome   . Varicose veins   . Fatigue   . History of RIND (reversible ischemic neurological deficit)   . Hypothyroidism   . Complication of anesthesia     "very sensitive to it; hard for me to wakeup"  . Breast cancer     right  . Kidney stones   . Angina     Past Surgical History  Procedure Date  . Eye surgery     cataract bilateral  . Breast lumpectomy     right  . Tonsillectomy     "as a child"  . Appendectomy 1939  . Insert / replace / remove pacemaker   . Insert / replace / remove pacemaker 10/2011    medtronic generator replacement  Dual>single  . Oophorectomy 1951    right; "had tumor there"    Current Outpatient Prescriptions  Medication Sig Dispense Refill  . albuterol (PROVENTIL HFA;VENTOLIN HFA) 108 (90 BASE) MCG/ACT inhaler Inhale 1 puff into the lungs every 6 (six) hours as  needed for wheezing or shortness of breath.  1 Inhaler  0  . cholecalciferol (VITAMIN D) 400 UNITS TABS Take 400 Units by mouth daily.      . diclofenac sodium (VOLTAREN) 1 % GEL Place 1 application onto the skin 2 (two) times daily as needed. For knee pain      . diltiazem (CARDIZEM CD) 240 MG 24 hr capsule Take 240 mg by mouth daily.        . Hypromellose (GENTEAL) 0.3 % SOLN Place 1 drop into both eyes daily.       Marland Kitchen levothyroxine (SYNTHROID, LEVOTHROID) 75 MCG tablet Take 37.5 mcg by mouth daily.      . Multiple Vitamins-Minerals (ICAPS AREDS FORMULA PO) Take 1 capsule by mouth daily.      Marland Kitchen oxyCODONE (OXY IR/ROXICODONE) 5 MG immediate release tablet Take 5 mg by mouth 3 (three) times daily as needed. For pain      . warfarin (COUMADIN) 3 MG tablet Take 1 tablet (3 mg total) by mouth daily.      Marland Kitchen DISCONTD: atenolol (TENORMIN) 25 MG tablet Take 1 tablet (25 mg total) by mouth daily.  30 tablet  0  . DISCONTD: ferrous sulfate 325 (65 FE) MG tablet Take 1 tablet (325 mg total) by mouth  2 (two) times daily with a meal.  60 tablet  0    Allergies  Allergen Reactions  . Amiodarone Other (See Comments)    dizziness  . Beet Other (See Comments)    unknown  . Benzyl Alc-Guai-Pse Other (See Comments)    unknown  . Caffeine Other (See Comments)    unknown  . Chocolate Other (See Comments)    unknown  . Codeine Phosphate Other (See Comments)    unknown  . Darvon Other (See Comments)    unknown  . Dilaudid (Hydromorphone Hcl) Other (See Comments)    unknown  . Dimetapp Cold-Allergy Other (See Comments)    unknown  . Dust Mite Extract Other (See Comments)    unknown  . Estrogens Other (See Comments)    unknown  . Fungizone (Amphotericin B) Other (See Comments)    unknown  . Iodine Other (See Comments)    unknown  . Librax Other (See Comments)    unknown  . Micardis (Telmisartan) Other (See Comments)    Fatigue and back pain  . Milk (Dairy) Other (See Comments)    unknown  . Mold  Extract (Trichophyton Mentagrophyte) Other (See Comments)    unknown  . Motrin (Ibuprofen) Other (See Comments)    unknown  . Penicillins Other (See Comments)    unknown  . Petroleum Jelly (Petrolatum) Other (See Comments)    unknown  . Pineapple Other (See Comments)    unknown  . Plavix (Clopidogrel Bisulfate) Other (See Comments)    dizziness  . Prednisone Other (See Comments)    unknown  . Soap Other (See Comments)    unknown  . Sulfamethoxazole Other (See Comments)    unknown  . Tagamet (Cimetidine) Other (See Comments)    unknown  . Talwin Other (See Comments)    unknown  . Tape Other (See Comments)    unknown  . Watermelon Concentrate Other (See Comments)    unknown  . Zithromax (Azithromycin Dihydrate) Other (See Comments)    unknown  . Dyazide (Hydrochlorothiazide W/Triamterene) Rash  . Erythromycin Other (See Comments)    unknown    Review of Systems negative except from HPI and PMH  Physical Exam BP 165/89  Pulse 84  Ht 5\' 4"  (1.626 m)  Wt 165 lb 1.9 oz (74.898 kg)  BMI 28.34 kg/m2 Well developed and well nourished in no acute distress HENT normal E scleral and icterus clear Neck Supple JVP flat; carotids brisk and full Clear to ausculation Pacer site well healed Regular rate and rhythm, no murmurs gallops or rub Soft with active bowel sounds No clubbing cyanosis none Edema Alert and oriented, grossly normal motor and sensory function Skin Warm and Dry   Assessment and  Plan

## 2012-01-04 NOTE — Assessment & Plan Note (Signed)
Stable on current meds 

## 2012-01-04 NOTE — Assessment & Plan Note (Signed)
The patient's device was interrogated.  The information was reviewed. No changes were made in the programming.    

## 2012-01-04 NOTE — Patient Instructions (Signed)
Your physician wants you to follow-up in: February 2014 with Dr. Graciela Husbands. You will receive a reminder letter in the mail two months in advance. If you  don't receive a letter, please call our office to schedule the follow-up appointment.  Your physician recommends that you continue on your current medications as directed. Please refer to the Current Medication list given to you today.

## 2012-02-25 ENCOUNTER — Other Ambulatory Visit (HOSPITAL_COMMUNITY): Payer: Self-pay | Admitting: *Deleted

## 2012-02-25 MED ORDER — SODIUM CHLORIDE 0.9 % IV SOLN
100.0000 mg | INTRAVENOUS | Status: DC
  Filled 2012-02-25: qty 5

## 2012-02-25 MED ORDER — SODIUM CHLORIDE 0.9 % IV SOLN: 100.0000 mg | INTRAVENOUS | Status: DC

## 2012-02-28 ENCOUNTER — Encounter (HOSPITAL_COMMUNITY)
Admission: RE | Admit: 2012-02-28 | Discharge: 2012-02-28 | Disposition: A | Discharge status: Home / Self Care | Payer: Medicare Other | Source: Ambulatory Visit | Attending: Family Medicine | Admitting: Family Medicine

## 2012-02-28 ENCOUNTER — Encounter (HOSPITAL_COMMUNITY): Payer: Self-pay

## 2012-02-28 DIAGNOSIS — D509 Iron deficiency anemia, unspecified: Secondary | ICD-10-CM | POA: Insufficient documentation

## 2012-02-28 MED ORDER — SODIUM CHLORIDE 0.9 % IV SOLN
Freq: Every day | INTRAVENOUS | Status: DC
  Administered 2012-02-28: 12:00:00 via INTRAVENOUS

## 2012-02-28 MED ORDER — SODIUM CHLORIDE 0.9 % IV SOLN
100.0000 mg | INTRAVENOUS | Status: DC
  Administered 2012-02-28: 100 mg via INTRAVENOUS
  Filled 2012-02-28: qty 5

## 2012-02-28 NOTE — Discharge Instructions (Signed)
Iron Sucrose injection What is this medicine? IRON SUCROSE (AHY ern SOO krohs) is an iron complex. Iron is used to make healthy red blood cells, which carry oxygen and nutrients throughout the body. This medicine is used to treat iron deficiency anemia in people with chronic kidney disease. This medicine may be used for other purposes; ask your health care provider or pharmacist if you have questions. What should I tell my health care provider before I take this medicine? They need to know if you have any of these conditions: -anemia not caused by low iron levels -heart disease -high levels of iron in the blood -kidney disease -liver disease -an unusual or allergic reaction to iron, other medicines, foods, dyes, or preservatives -pregnant or trying to get pregnant -breast-feeding How should I use this medicine? This medicine is for infusion into a vein. It is given by a health care professional in a hospital or clinic setting. Talk to your pediatrician regarding the use of this medicine in children. Special care may be needed. Overdosage: If you think you have taken too much of this medicine contact a poison control center or emergency room at once. NOTE: This medicine is only for you. Do not share this medicine with others. What if I miss a dose? It is important not to miss your dose. Call your doctor or health care professional if you are unable to keep an appointment. What may interact with this medicine? Do not take this medicine with any of the following medications: -deferoxamine -dimercaprol -other iron products This medicine may also interact with the following medications: -chloramphenicol -deferasirox This list may not describe all possible interactions. Give your health care provider a list of all the medicines, herbs, non-prescription drugs, or dietary supplements you use. Also tell them if you smoke, drink alcohol, or use illegal drugs. Some items may interact with your  medicine. What should I watch for while using this medicine? Visit your doctor or healthcare professional regularly. Tell your doctor or healthcare professional if your symptoms do not start to get better or if they get worse. You may need blood work done while you are taking this medicine. You may need to follow a special diet. Talk to your doctor. Foods that contain iron include: whole grains/cereals, dried fruits, beans, or peas, leafy green vegetables, and organ meats (liver, kidney). What side effects may I notice from receiving this medicine? Side effects that you should report to your doctor or health care professional as soon as possible: -allergic reactions like skin rash, itching or hives, swelling of the face, lips, or tongue -breathing problems -changes in blood pressure -cough -fast, irregular heartbeat -feeling faint or lightheaded, falls -fever or chills -flushing, sweating, or hot feelings -joint or muscle aches/pains -seizures -swelling of the ankles or feet -unusually weak or tired Side effects that usually do not require medical attention (report to your doctor or health care professional if they continue or are bothersome): -diarrhea -feeling achy -headache -irritation at site where injected -nausea, vomiting -stomach upset -tiredness This list may not describe all possible side effects. Call your doctor for medical advice about side effects. You may report side effects to FDA at 1-800-FDA-1088. Where should I keep my medicine? This drug is given in a hospital or clinic and will not be stored at home. NOTE: This sheet is a summary. It may not cover all possible information. If you have questions about this medicine, talk to your doctor, pharmacist, or health care provider.  2012, [employer]. (01/16/2008 5:08:34 PM) 

## 2012-03-02 ENCOUNTER — Encounter (HOSPITAL_COMMUNITY): Payer: Self-pay

## 2012-03-02 ENCOUNTER — Encounter (HOSPITAL_COMMUNITY)
Admission: RE | Admit: 2012-03-02 | Discharge: 2012-03-02 | Disposition: A | Discharge status: Home / Self Care | Payer: Medicare Other | Source: Ambulatory Visit | Attending: Family Medicine | Admitting: Family Medicine

## 2012-03-02 MED ORDER — SODIUM CHLORIDE 0.9 % IV SOLN
Freq: Every day | INTRAVENOUS | Status: DC
  Administered 2012-03-02: 11:00:00 via INTRAVENOUS

## 2012-03-02 MED ORDER — SODIUM CHLORIDE 0.9 % IV SOLN
100.0000 mg | INTRAVENOUS | Status: DC
  Administered 2012-03-02: 100 mg via INTRAVENOUS
  Filled 2012-03-02: qty 5

## 2012-03-02 NOTE — Discharge Instructions (Signed)
Iron Sucrose injection What is this medicine? IRON SUCROSE (AHY ern SOO krohs) is an iron complex. Iron is used to make healthy red blood cells, which carry oxygen and nutrients throughout the body. This medicine is used to treat iron deficiency anemia in people with chronic kidney disease. This medicine may be used for other purposes; ask your health care provider or pharmacist if you have questions. What should I tell my health care provider before I take this medicine? They need to know if you have any of these conditions: -anemia not caused by low iron levels -heart disease -high levels of iron in the blood -kidney disease -liver disease -an unusual or allergic reaction to iron, other medicines, foods, dyes, or preservatives -pregnant or trying to get pregnant -breast-feeding How should I use this medicine? This medicine is for infusion into a vein. It is given by a health care professional in a hospital or clinic setting. Talk to your pediatrician regarding the use of this medicine in children. Special care may be needed. Overdosage: If you think you have taken too much of this medicine contact a poison control center or emergency room at once. NOTE: This medicine is only for you. Do not share this medicine with others. What if I miss a dose? It is important not to miss your dose. Call your doctor or health care professional if you are unable to keep an appointment. What may interact with this medicine? Do not take this medicine with any of the following medications: -deferoxamine -dimercaprol -other iron products This medicine may also interact with the following medications: -chloramphenicol -deferasirox This list may not describe all possible interactions. Give your health care provider a list of all the medicines, herbs, non-prescription drugs, or dietary supplements you use. Also tell them if you smoke, drink alcohol, or use illegal drugs. Some items may interact with your  medicine. What should I watch for while using this medicine? Visit your doctor or healthcare professional regularly. Tell your doctor or healthcare professional if your symptoms do not start to get better or if they get worse. You may need blood work done while you are taking this medicine. You may need to follow a special diet. Talk to your doctor. Foods that contain iron include: whole grains/cereals, dried fruits, beans, or peas, leafy green vegetables, and organ meats (liver, kidney). What side effects may I notice from receiving this medicine? Side effects that you should report to your doctor or health care professional as soon as possible: -allergic reactions like skin rash, itching or hives, swelling of the face, lips, or tongue -breathing problems -changes in blood pressure -cough -fast, irregular heartbeat -feeling faint or lightheaded, falls -fever or chills -flushing, sweating, or hot feelings -joint or muscle aches/pains -seizures -swelling of the ankles or feet -unusually weak or tired Side effects that usually do not require medical attention (report to your doctor or health care professional if they continue or are bothersome): -diarrhea -feeling achy -headache -irritation at site where injected -nausea, vomiting -stomach upset -tiredness This list may not describe all possible side effects. Call your doctor for medical advice about side effects. You may report side effects to FDA at 1-800-FDA-1088. Where should I keep my medicine? This drug is given in a hospital or clinic and will not be stored at home. NOTE: This sheet is a summary. It may not cover all possible information. If you have questions about this medicine, talk to your doctor, pharmacist, or health care provider.  2012, [employer]. (01/16/2008 5:08:34 PM) 

## 2012-03-06 ENCOUNTER — Encounter (HOSPITAL_COMMUNITY)
Admission: RE | Admit: 2012-03-06 | Discharge: 2012-03-06 | Disposition: A | Discharge status: Home / Self Care | Payer: Medicare Other | Source: Ambulatory Visit | Attending: Family Medicine | Admitting: Family Medicine

## 2012-03-06 ENCOUNTER — Encounter (HOSPITAL_COMMUNITY): Payer: Self-pay

## 2012-03-06 MED ORDER — SODIUM CHLORIDE 0.9 % IV SOLN
Freq: Every day | INTRAVENOUS | Status: DC
  Administered 2012-03-06: 12:00:00 via INTRAVENOUS

## 2012-03-06 MED ORDER — SODIUM CHLORIDE 0.9 % IV SOLN
100.0000 mg | INTRAVENOUS | Status: DC
  Administered 2012-03-06: 100 mg via INTRAVENOUS
  Filled 2012-03-06 (×2): qty 5

## 2012-03-06 NOTE — Progress Notes (Signed)
At the end of the Venofer infusion #3/10 today pt states she "feels fine" and maybe "a litlte better than when I got here"

## 2012-03-06 NOTE — Progress Notes (Signed)
Pt states after her last infusion (Thursday 03/02/12) she  "felt listless and washed ou and couldn't take my walks and stayed in bed" and this feeling has lasted until today. Pt was able to transfer to recliner with assistance States after the first on Monday 02/28/12) she"felt like she had a lot of energy". Pt denies any other c/o as far as itching shortness of breath. States she still wants to take the infusion today  .

## 2012-03-06 NOTE — Progress Notes (Signed)
Placed call to Dr Pablo Lawrence office about pt's description of how she felt. He would like her to continue with biweekly infusions as long as she had no symptoms of allergic reaction notified pt of this and she is in agreement

## 2012-03-06 NOTE — Discharge Instructions (Signed)
Refer to printed sheet for next appointment. Short Stay Phone # [phone] Your next appointment in Short Stay will be Thursday 03/09/12 at 1100AM

## 2012-03-09 ENCOUNTER — Encounter (HOSPITAL_COMMUNITY)
Admission: RE | Admit: 2012-03-09 | Discharge: 2012-03-09 | Disposition: A | Discharge status: Home / Self Care | Payer: Medicare Other | Source: Ambulatory Visit | Attending: Family Medicine | Admitting: Family Medicine

## 2012-03-09 ENCOUNTER — Encounter (HOSPITAL_COMMUNITY): Payer: Self-pay

## 2012-03-09 MED ORDER — SODIUM CHLORIDE 0.9 % IV SOLN
Freq: Every day | INTRAVENOUS | Status: DC
  Administered 2012-03-09: 11:00:00 via INTRAVENOUS

## 2012-03-09 MED ORDER — SODIUM CHLORIDE 0.9 % IV SOLN
100.0000 mg | INTRAVENOUS | Status: DC
  Administered 2012-03-09: 100 mg via INTRAVENOUS
  Filled 2012-03-09 (×2): qty 5

## 2012-03-09 NOTE — Discharge Instructions (Signed)
Refer to printed sheet for next appointment. Short Stay Phone # [phone] Pt and caretaker decline offer of next appointment. They have a paper from [facility] visit with appointments for next week

## 2012-03-13 ENCOUNTER — Encounter (HOSPITAL_COMMUNITY): Payer: Self-pay

## 2012-03-13 ENCOUNTER — Encounter (HOSPITAL_COMMUNITY)
Admission: RE | Admit: 2012-03-13 | Discharge: 2012-03-13 | Disposition: A | Discharge status: Home / Self Care | Payer: Medicare Other | Source: Ambulatory Visit | Attending: Family Medicine | Admitting: Family Medicine

## 2012-03-13 DIAGNOSIS — D509 Iron deficiency anemia, unspecified: Secondary | ICD-10-CM | POA: Insufficient documentation

## 2012-03-13 MED ORDER — SODIUM CHLORIDE 0.9 % IV SOLN
Freq: Every day | INTRAVENOUS | Status: DC
  Administered 2012-03-13: 12:00:00 via INTRAVENOUS

## 2012-03-13 MED ORDER — SODIUM CHLORIDE 0.9 % IV SOLN
100.0000 mg | INTRAVENOUS | Status: DC
  Administered 2012-03-13: 100 mg via INTRAVENOUS
  Filled 2012-03-13: qty 5

## 2012-03-13 NOTE — Discharge Instructions (Signed)
Pt declines any information on Venofer or copy of her next appointments . She is aware of both

## 2012-03-13 NOTE — Progress Notes (Signed)
Patient states she had what she has dx. As a panic attack on Saturday. Facility called 911 and the checked her out but she was not transported to hospital states she feels better today.

## 2012-03-17 ENCOUNTER — Encounter (HOSPITAL_COMMUNITY): Admission: RE | Admit: 2012-03-17 | Payer: Medicare Other | Source: Ambulatory Visit

## 2012-03-17 NOTE — Progress Notes (Signed)
Pt called earlier today and states she had weakness,nausea and diarrhea and has called MD and would not be here on 03/17/12 for her appointment  Later received call from Dr Dalbert Mayotte office and Pt would be here on 03/20/12 for her Venofer infusion

## 2012-03-20 ENCOUNTER — Encounter (HOSPITAL_COMMUNITY): Payer: Self-pay

## 2012-03-20 ENCOUNTER — Encounter (HOSPITAL_COMMUNITY)
Admission: RE | Admit: 2012-03-20 | Discharge: 2012-03-20 | Disposition: A | Discharge status: Home / Self Care | Payer: Medicare Other | Source: Ambulatory Visit | Attending: Family Medicine | Admitting: Family Medicine

## 2012-03-20 MED ORDER — SODIUM CHLORIDE 0.9 % IV SOLN
100.0000 mg | INTRAVENOUS | Status: DC
  Administered 2012-03-20: 100 mg via INTRAVENOUS
  Filled 2012-03-20: qty 5

## 2012-03-20 MED ORDER — SODIUM CHLORIDE 0.9 % IV SOLN
Freq: Every day | INTRAVENOUS | Status: DC
  Administered 2012-03-20: 11:00:00 via INTRAVENOUS

## 2012-03-23 ENCOUNTER — Encounter (HOSPITAL_COMMUNITY)
Admission: RE | Admit: 2012-03-23 | Discharge: 2012-03-23 | Disposition: A | Discharge status: Home / Self Care | Payer: Medicare Other | Source: Ambulatory Visit | Attending: Family Medicine | Admitting: Family Medicine

## 2012-03-23 ENCOUNTER — Encounter (HOSPITAL_COMMUNITY): Payer: Self-pay

## 2012-03-23 MED ORDER — SODIUM CHLORIDE 0.9 % IV SOLN
Freq: Every day | INTRAVENOUS | Status: DC
  Administered 2012-03-23: 11:00:00 via INTRAVENOUS

## 2012-03-23 MED ORDER — SODIUM CHLORIDE 0.9 % IV SOLN
100.0000 mg | INTRAVENOUS | Status: DC
  Administered 2012-03-23: 100 mg via INTRAVENOUS
  Filled 2012-03-23 (×2): qty 5

## 2012-03-27 ENCOUNTER — Encounter (HOSPITAL_COMMUNITY)
Admission: RE | Admit: 2012-03-27 | Discharge: 2012-03-27 | Disposition: A | Discharge status: Home / Self Care | Payer: Medicare Other | Source: Ambulatory Visit | Attending: Family Medicine | Admitting: Family Medicine

## 2012-03-27 ENCOUNTER — Encounter (HOSPITAL_COMMUNITY): Payer: Self-pay

## 2012-03-27 MED ORDER — SODIUM CHLORIDE 0.9 % IV SOLN
Freq: Every day | INTRAVENOUS | Status: DC
  Administered 2012-03-27: 250 mL via INTRAVENOUS

## 2012-03-27 MED ORDER — SODIUM CHLORIDE 0.9 % IV SOLN
100.0000 mg | INTRAVENOUS | Status: DC
  Administered 2012-03-27: 100 mg via INTRAVENOUS
  Filled 2012-03-27: qty 5

## 2012-03-30 ENCOUNTER — Encounter (HOSPITAL_COMMUNITY)
Admission: RE | Admit: 2012-03-30 | Discharge: 2012-03-30 | Disposition: A | Discharge status: Home / Self Care | Payer: Medicare Other | Source: Ambulatory Visit | Attending: Family Medicine | Admitting: Family Medicine

## 2012-03-30 ENCOUNTER — Encounter (HOSPITAL_COMMUNITY): Payer: Self-pay

## 2012-03-30 MED ORDER — SODIUM CHLORIDE 0.9 % IV SOLN
100.0000 mg | INTRAVENOUS | Status: DC
  Administered 2012-03-30: 100 mg via INTRAVENOUS
  Filled 2012-03-30: qty 5

## 2012-03-30 MED ORDER — SODIUM CHLORIDE 0.9 % IV SOLN
Freq: Every day | INTRAVENOUS | Status: DC
  Administered 2012-03-30: 11:00:00 via INTRAVENOUS

## 2012-04-03 ENCOUNTER — Encounter (HOSPITAL_COMMUNITY)
Admission: RE | Admit: 2012-04-03 | Discharge: 2012-04-03 | Disposition: A | Discharge status: Home / Self Care | Payer: Medicare Other | Source: Ambulatory Visit | Attending: Family Medicine | Admitting: Family Medicine

## 2012-04-03 MED ORDER — SODIUM CHLORIDE 0.9 % IV SOLN
Freq: Every day | INTRAVENOUS | Status: AC
  Administered 2012-04-03: 11:00:00 via INTRAVENOUS

## 2012-04-03 MED ORDER — SODIUM CHLORIDE 0.9 % IV SOLN
100.0000 mg | INTRAVENOUS | Status: AC
  Administered 2012-04-03: 100 mg via INTRAVENOUS
  Filled 2012-04-03: qty 5

## 2012-10-19 ENCOUNTER — Ambulatory Visit (INDEPENDENT_AMBULATORY_CARE_PROVIDER_SITE_OTHER): Payer: Medicare Other | Admitting: Internal Medicine

## 2012-10-19 ENCOUNTER — Encounter: Payer: Self-pay | Admitting: Internal Medicine

## 2012-10-19 VITALS — BP 143/84 | HR 81 | Wt 165.0 lb

## 2012-10-19 DIAGNOSIS — I495 Sick sinus syndrome: Secondary | ICD-10-CM

## 2012-10-19 DIAGNOSIS — I4891 Unspecified atrial fibrillation: Secondary | ICD-10-CM

## 2012-10-19 DIAGNOSIS — Z950 Presence of cardiac pacemaker: Secondary | ICD-10-CM

## 2012-10-19 LAB — PACEMAKER DEVICE OBSERVATION
BATTERY VOLTAGE: 2.78 V
BMOD-0005RV: 95 {beats}/min
BRDY-0002RV: 60 {beats}/min
RV LEAD AMPLITUDE: 22.4 mv

## 2012-10-19 NOTE — Assessment & Plan Note (Signed)
stable °

## 2012-10-19 NOTE — Assessment & Plan Note (Signed)
The patient's device was interrogated.  The information was reviewed. No changes were made in the programming.    

## 2012-10-19 NOTE — Assessment & Plan Note (Signed)
Permanent

## 2012-10-19 NOTE — Patient Instructions (Signed)

## 2012-10-19 NOTE — Progress Notes (Signed)
HPI  [name] is a 79 y.o. female Seen in followup for a pacemaker changed out in January 2013 initially implanted 2004 for tachybradycardia syndrome.     She initially had paroxysmal atrial fibrillation treated with sotalol which apparently by history is permanent. She does take warfarin.  She also has a history of hypertension.    The patient denies chest pain, shortness of breath, nocturnal dyspnea, orthopnea or peripheral edema.  There have been no palpitations, lightheadedness or syncope.   Past Medical History  Diagnosis Date  . Arthritis   . Chronic atrial fibrillation       chronic coumadin anticoagulation  . Hypertension   . Esophageal reflux     with paraesophageal hernia  . Diverticulosis   . Spinal stenosis     with chronic LBP  . Anemia   . Vitamin D deficiency   . Osteoporosis   . UTI (lower urinary tract infection)   . Tachy-brady syndrome   . Varicose veins   . Fatigue   . History of RIND (reversible ischemic neurological deficit)   . Hypothyroidism   . Complication of anesthesia     "very sensitive to it; hard for me to wakeup"  . Breast cancer     right  . Kidney stones   . Angina     Past Surgical History  Procedure Date  . Eye surgery     cataract bilateral  . Breast lumpectomy     right  . Tonsillectomy     "as a child"  . Appendectomy 1939  . Insert / replace / remove pacemaker   . Insert / replace / remove pacemaker 10/2011    medtronic generator replacement  Dual>single  . Oophorectomy 1951    right; "had tumor there"    Current Outpatient Prescriptions  Medication Sig Dispense Refill  . albuterol (PROVENTIL HFA;VENTOLIN HFA) 108 (90 BASE) MCG/ACT inhaler Inhale 1 puff into the lungs every 6 (six) hours as needed for wheezing or shortness of breath.  1 Inhaler  0  . cholecalciferol (VITAMIN D) 400 UNITS TABS Take 400 Units by mouth daily.      . diclofenac sodium (VOLTAREN) 1 % GEL Place 1 application onto the skin 2  (two) times daily as needed. For knee pain      . diltiazem (CARDIZEM CD) 240 MG 24 hr capsule Take 240 mg by mouth daily.        Marland Kitchen estradiol (ESTRACE) 0.5 MG tablet Take 0.5 mg by mouth daily.      . Hypromellose (GENTEAL) 0.3 % SOLN Place 1 drop into both eyes daily.       Marland Kitchen levothyroxine (SYNTHROID, LEVOTHROID) 75 MCG tablet Take 37.5 mcg by mouth daily.      . Multiple Vitamins-Minerals (ICAPS AREDS FORMULA PO) Take 1 capsule by mouth daily.      Marland Kitchen oxyCODONE (OXY IR/ROXICODONE) 5 MG immediate release tablet Take 5 mg by mouth 3 (three) times daily as needed. For pain      . sertraline (ZOLOFT) 25 MG tablet Take 12.5 mg by mouth daily.      Marland Kitchen warfarin (COUMADIN) 3 MG tablet Take 1 tablet (3 mg total) by mouth daily.      . [DISCONTINUED] atenolol (TENORMIN) 25 MG tablet Take 1 tablet (25 mg total) by mouth daily.  30 tablet  0  . [DISCONTINUED] ferrous sulfate 325 (65 FE) MG tablet Take 1 tablet (325 mg total) by mouth 2 (two) times daily with a  meal.  60 tablet  0    Allergies  Allergen Reactions  . Amiodarone Other (See Comments)    dizziness  . Beet Other (See Comments)    unknown  . Benzyl Alc-Guai-Pse Other (See Comments)    unknown  . Caffeine Other (See Comments)    unknown  . Chocolate Other (See Comments)    unknown  . Clidinium-Chlordiazepoxide Other (See Comments)    unknown  . Codeine Phosphate Other (See Comments)    unknown  . Darvon Other (See Comments)    unknown  . Dilaudid (Hydromorphone Hcl) Other (See Comments)    unknown  . Dimetapp Cold-Allergy Other (See Comments)    unknown  . Dust Mite Extract Other (See Comments)    unknown  . Estrogens Other (See Comments)    unknown  . Fungizone (Amphotericin B) Other (See Comments)    unknown  . Iodine Other (See Comments)    unknown  . Micardis (Telmisartan) Other (See Comments)    Fatigue and back pain  . Milk (Dairy) Other (See Comments)    unknown  . Mold Extract (Trichophyton Mentagrophyte) Other  (See Comments)    unknown  . Motrin (Ibuprofen) Other (See Comments)    unknown  . Penicillins Other (See Comments)    unknown  . Pentazocine Lactate Other (See Comments)    unknown  . Petroleum Jelly (Petrolatum) Other (See Comments)    unknown  . Pineapple Other (See Comments)    unknown  . Plavix (Clopidogrel Bisulfate) Other (See Comments)    dizziness  . Prednisone Other (See Comments)    unknown  . Soap Other (See Comments)    unknown  . Sulfamethoxazole Other (See Comments)    unknown  . Tagamet (Cimetidine) Other (See Comments)    unknown  . Tape Other (See Comments)    unknown  . Watermelon Concentrate Other (See Comments)    unknown  . Zithromax (Azithromycin Dihydrate) Other (See Comments)    unknown  . Dyazide (Hydrochlorothiazide W-Triamterene) Rash  . Erythromycin Other (See Comments)    unknown    Review of Systems negative except from HPI and PMH  Physical Exam BP 143/84  Pulse 81  Wt 165 lb (74.844 kg) Well developed and well nourished in no acute distress HENT normal E scleral and icterus clear Neck Supple JVP flat; carotids brisk and full Clear to ausculation Pacer site well healed Regular rate and rhythm, no murmurs gallops or rub Soft with active bowel sounds No clubbing cyanosis none Edema Alert and oriented, grossly normal motor and sensory function Skin Warm and Dry   Assessment and  Plan

## 2013-01-15 ENCOUNTER — Encounter: Payer: Medicare Other | Admitting: *Deleted

## 2013-01-17 ENCOUNTER — Encounter (HOSPITAL_COMMUNITY): Payer: Medicare Other

## 2013-01-24 ENCOUNTER — Encounter (HOSPITAL_COMMUNITY): Payer: Medicare Other

## 2013-01-25 ENCOUNTER — Other Ambulatory Visit (HOSPITAL_COMMUNITY): Payer: Self-pay | Admitting: Family Medicine

## 2013-01-25 ENCOUNTER — Encounter (HOSPITAL_COMMUNITY): Payer: Self-pay

## 2013-01-25 ENCOUNTER — Encounter (HOSPITAL_COMMUNITY)
Admission: RE | Admit: 2013-01-25 | Discharge: 2013-01-25 | Disposition: A | Discharge status: Home / Self Care | Payer: Medicare Other | Source: Ambulatory Visit | Attending: Family Medicine | Admitting: Family Medicine

## 2013-01-25 DIAGNOSIS — D509 Iron deficiency anemia, unspecified: Secondary | ICD-10-CM | POA: Insufficient documentation

## 2013-01-25 MED ORDER — SODIUM CHLORIDE 0.9 % IV SOLN
INTRAVENOUS | Status: DC
  Administered 2013-01-25: 12:00:00 via INTRAVENOUS

## 2013-01-25 MED ORDER — IRON SUCROSE 20 MG/ML IV SOLN
100.0000 mg | INTRAVENOUS | Status: DC
  Administered 2013-01-25: 100 mg via INTRAVENOUS
  Filled 2013-01-25: qty 5

## 2013-01-26 ENCOUNTER — Encounter: Payer: Self-pay | Admitting: *Deleted

## 2013-01-29 ENCOUNTER — Encounter (HOSPITAL_COMMUNITY)
Admission: RE | Admit: 2013-01-29 | Discharge: 2013-01-29 | Disposition: A | Discharge status: Home / Self Care | Payer: Medicare Other | Source: Ambulatory Visit | Attending: Family Medicine | Admitting: Family Medicine

## 2013-01-29 ENCOUNTER — Encounter (HOSPITAL_COMMUNITY): Payer: Self-pay

## 2013-01-29 MED ORDER — SODIUM CHLORIDE 0.9 % IV SOLN
INTRAVENOUS | Status: DC
  Administered 2013-01-29: 12:00:00 via INTRAVENOUS

## 2013-01-29 MED ORDER — SODIUM CHLORIDE 0.9 % IV SOLN
100.0000 mg | INTRAVENOUS | Status: DC
  Administered 2013-01-29: 100 mg via INTRAVENOUS
  Filled 2013-01-29: qty 5

## 2013-01-29 NOTE — Progress Notes (Signed)
OUT VIA W/C W STAFF. Home w caregiver.

## 2013-01-31 ENCOUNTER — Encounter (HOSPITAL_COMMUNITY): Payer: Medicare Other

## 2013-02-05 ENCOUNTER — Encounter: Payer: Self-pay | Admitting: Internal Medicine

## 2013-02-06 ENCOUNTER — Ambulatory Visit (INDEPENDENT_AMBULATORY_CARE_PROVIDER_SITE_OTHER): Payer: Medicare Other | Admitting: *Deleted

## 2013-02-06 DIAGNOSIS — I495 Sick sinus syndrome: Secondary | ICD-10-CM

## 2013-02-06 DIAGNOSIS — Z950 Presence of cardiac pacemaker: Secondary | ICD-10-CM

## 2013-02-07 ENCOUNTER — Encounter (HOSPITAL_COMMUNITY): Payer: Medicare Other

## 2013-02-08 ENCOUNTER — Encounter (HOSPITAL_COMMUNITY)
Admission: RE | Admit: 2013-02-08 | Discharge: 2013-02-08 | Disposition: A | Discharge status: Home / Self Care | Payer: Medicare Other | Source: Ambulatory Visit | Attending: Family Medicine | Admitting: Family Medicine

## 2013-02-08 MED ORDER — SODIUM CHLORIDE 0.9 % IV SOLN
INTRAVENOUS | Status: DC
  Administered 2013-02-08: 20 mL/h via INTRAVENOUS

## 2013-02-08 MED ORDER — IRON SUCROSE 20 MG/ML IV SOLN
100.0000 mg | INTRAVENOUS | Status: DC
  Administered 2013-02-08: 100 mg via INTRAVENOUS
  Filled 2013-02-08: qty 5

## 2013-02-08 NOTE — Discharge Instructions (Signed)
Iron Sucrose injection What is this medicine? IRON SUCROSE (AHY ern SOO krohs) is an iron complex. Iron is used to make healthy red blood cells, which carry oxygen and nutrients throughout the body. This medicine is used to treat iron deficiency anemia in people with chronic kidney disease. This medicine may be used for other purposes; ask your health care provider or pharmacist if you have questions. What should I tell my health care provider before I take this medicine? They need to know if you have any of these conditions: -anemia not caused by low iron levels -heart disease -high levels of iron in the blood -kidney disease -liver disease -an unusual or allergic reaction to iron, other medicines, foods, dyes, or preservatives -pregnant or trying to get pregnant -breast-feeding How should I use this medicine? This medicine is for infusion into a vein. It is given by a health care professional in a hospital or clinic setting. Talk to your pediatrician regarding the use of this medicine in children. Special care may be needed. Overdosage: If you think you have taken too much of this medicine contact a poison control center or emergency room at once. NOTE: This medicine is only for you. Do not share this medicine with others. What if I miss a dose? It is important not to miss your dose. Call your doctor or health care professional if you are unable to keep an appointment. What may interact with this medicine? Do not take this medicine with any of the following medications: -deferoxamine -dimercaprol -other iron products This medicine may also interact with the following medications: -chloramphenicol -deferasirox This list may not describe all possible interactions. Give your health care provider a list of all the medicines, herbs, non-prescription drugs, or dietary supplements you use. Also tell them if you smoke, drink alcohol, or use illegal drugs. Some items may interact with your  medicine. What should I watch for while using this medicine? Visit your doctor or healthcare professional regularly. Tell your doctor or healthcare professional if your symptoms do not start to get better or if they get worse. You may need blood work done while you are taking this medicine. You may need to follow a special diet. Talk to your doctor. Foods that contain iron include: whole grains/cereals, dried fruits, beans, or peas, leafy green vegetables, and organ meats (liver, kidney). What side effects may I notice from receiving this medicine? Side effects that you should report to your doctor or health care professional as soon as possible: -allergic reactions like skin rash, itching or hives, swelling of the face, lips, or tongue -breathing problems -changes in blood pressure -cough -fast, irregular heartbeat -feeling faint or lightheaded, falls -fever or chills -flushing, sweating, or hot feelings -joint or muscle aches/pains -seizures -swelling of the ankles or feet -unusually weak or tired Side effects that usually do not require medical attention (report to your doctor or health care professional if they continue or are bothersome): -diarrhea -feeling achy -headache -irritation at site where injected -nausea, vomiting -stomach upset -tiredness This list may not describe all possible side effects. Call your doctor for medical advice about side effects. You may report side effects to FDA at 1-800-FDA-1088. Where should I keep my medicine? This drug is given in a hospital or clinic and will not be stored at home. NOTE: This sheet is a summary. It may not cover all possible information. If you have questions about this medicine, talk to your doctor, pharmacist, or health care provider.  2012, [employer]. (01/16/2008 5:08:34 PM)

## 2013-02-11 LAB — REMOTE PACEMAKER DEVICE
BATTERY VOLTAGE: 2.78 V
BMOD-0003RV: 30
BMOD-0005RV: 95 {beats}/min
RV LEAD AMPLITUDE: 22.4 mv
RV LEAD IMPEDENCE PM: 664 Ohm
VENTRICULAR PACING PM: 2

## 2013-02-12 ENCOUNTER — Encounter (HOSPITAL_COMMUNITY)
Admission: RE | Admit: 2013-02-12 | Discharge: 2013-02-12 | Disposition: A | Discharge status: Home / Self Care | Payer: Medicare Other | Source: Ambulatory Visit | Attending: Family Medicine | Admitting: Family Medicine

## 2013-02-12 ENCOUNTER — Encounter (HOSPITAL_COMMUNITY): Payer: Self-pay

## 2013-02-12 DIAGNOSIS — D509 Iron deficiency anemia, unspecified: Secondary | ICD-10-CM | POA: Insufficient documentation

## 2013-02-12 MED ORDER — SODIUM CHLORIDE 0.9 % IV SOLN
100.0000 mg | INTRAVENOUS | Status: DC
  Administered 2013-02-12: 100 mg via INTRAVENOUS
  Filled 2013-02-12: qty 5

## 2013-02-12 MED ORDER — SODIUM CHLORIDE 0.9 % IV SOLN
INTRAVENOUS | Status: DC
  Administered 2013-02-12: 10:00:00 via INTRAVENOUS

## 2013-02-14 ENCOUNTER — Encounter (HOSPITAL_COMMUNITY): Payer: Medicare Other

## 2013-02-22 ENCOUNTER — Encounter (HOSPITAL_COMMUNITY): Payer: Self-pay

## 2013-02-22 ENCOUNTER — Encounter (HOSPITAL_COMMUNITY)
Admission: RE | Admit: 2013-02-22 | Discharge: 2013-02-22 | Disposition: A | Discharge status: Home / Self Care | Payer: Medicare Other | Source: Ambulatory Visit | Attending: Family Medicine | Admitting: Family Medicine

## 2013-02-22 MED ORDER — SODIUM CHLORIDE 0.9 % IV SOLN
100.0000 mg | INTRAVENOUS | Status: AC
  Administered 2013-02-22: 100 mg via INTRAVENOUS
  Filled 2013-02-22: qty 5

## 2013-02-22 MED ORDER — SODIUM CHLORIDE 0.9 % IV SOLN
INTRAVENOUS | Status: DC
  Administered 2013-02-22: 11:00:00 via INTRAVENOUS

## 2013-03-01 ENCOUNTER — Encounter (HOSPITAL_COMMUNITY): Payer: Medicare Other

## 2013-03-05 ENCOUNTER — Encounter: Payer: Self-pay | Admitting: *Deleted

## 2013-05-15 ENCOUNTER — Ambulatory Visit (INDEPENDENT_AMBULATORY_CARE_PROVIDER_SITE_OTHER): Payer: Medicare Other | Admitting: *Deleted

## 2013-05-15 ENCOUNTER — Encounter: Payer: Self-pay | Admitting: Internal Medicine

## 2013-05-15 DIAGNOSIS — Z950 Presence of cardiac pacemaker: Secondary | ICD-10-CM

## 2013-05-15 DIAGNOSIS — I495 Sick sinus syndrome: Secondary | ICD-10-CM

## 2013-05-25 LAB — REMOTE PACEMAKER DEVICE
BMOD-0005RV: 95 {beats}/min
RV LEAD AMPLITUDE: 22.4 mv
RV LEAD THRESHOLD: 0.625 V

## 2013-06-18 ENCOUNTER — Encounter: Payer: Self-pay | Admitting: *Deleted

## 2013-08-17 ENCOUNTER — Encounter: Payer: Self-pay | Admitting: Internal Medicine

## 2013-08-17 LAB — MDC_IDC_ENUM_SESS_TYPE_REMOTE
Brady Statistic RV Percent Paced: 2.8 %
Lead Channel Pacing Threshold Amplitude: 0.75 V
Lead Channel Pacing Threshold Pulse Width: 0.4 ms
Lead Channel Setting Pacing Amplitude: 2 V
Lead Channel Setting Pacing Pulse Width: 0.4 ms

## 2013-08-20 ENCOUNTER — Ambulatory Visit (INDEPENDENT_AMBULATORY_CARE_PROVIDER_SITE_OTHER): Payer: Medicare Other

## 2013-08-20 DIAGNOSIS — Z950 Presence of cardiac pacemaker: Secondary | ICD-10-CM

## 2013-08-20 DIAGNOSIS — I495 Sick sinus syndrome: Secondary | ICD-10-CM

## 2013-09-26 ENCOUNTER — Encounter: Payer: Self-pay | Admitting: *Deleted

## 2013-10-23 ENCOUNTER — Ambulatory Visit (INDEPENDENT_AMBULATORY_CARE_PROVIDER_SITE_OTHER): Payer: Medicare Other | Admitting: Cardiology

## 2013-10-23 ENCOUNTER — Encounter: Payer: Self-pay | Admitting: Cardiology

## 2013-10-23 VITALS — BP 133/88 | HR 84 | Wt 165.0 lb

## 2013-10-23 DIAGNOSIS — I495 Sick sinus syndrome: Secondary | ICD-10-CM

## 2013-10-23 DIAGNOSIS — Z950 Presence of cardiac pacemaker: Secondary | ICD-10-CM

## 2013-10-23 DIAGNOSIS — I4891 Unspecified atrial fibrillation: Secondary | ICD-10-CM

## 2013-10-23 LAB — MDC_IDC_ENUM_SESS_TYPE_INCLINIC
Battery Voltage: 2.78 V
Lead Channel Pacing Threshold Amplitude: 0.75 V
Lead Channel Pacing Threshold Pulse Width: 0.4 ms
Lead Channel Sensing Intrinsic Amplitude: 11.2 mV
Lead Channel Setting Pacing Amplitude: 2 V
Lead Channel Setting Pacing Pulse Width: 0.4 ms
MDC IDC MSMT BATTERY IMPEDANCE: 228 Ohm
MDC IDC MSMT BATTERY REMAINING LONGEVITY: 117 mo
MDC IDC MSMT LEADCHNL RV IMPEDANCE VALUE: 633 Ohm
MDC IDC SESS DTM: 20150210133554
MDC IDC SET LEADCHNL RV SENSING SENSITIVITY: 5.6 mV
MDC IDC STAT BRADY RV PERCENT PACED: 2.7 %

## 2013-10-23 NOTE — Progress Notes (Signed)
Patient ID: [name] MRN: [id number], DOB/AGE: 12/07/1919   Date of Visit: 10/23/2013  Primary Physician: Abigail Miyamoto, MD Primary Cardiologist: Jolyn Nap, MD Reason for Visit: EP/device follow-up  History of Present Illness  [name] is a 80 y.o. female with tachy-brady syndrome s/p PPM implant and permanent atrial fibrillation who presents today for routine electrophysiology followup. She is accompanied by her caregiver. Since last being seen in our clinic, she reports she is doing well and has no complaints. She denies chest pain or shortness of breath. She denies palpitations, dizziness, near syncope or syncope. She denies LE swelling, orthopnea, PND or recent weight gain. She is compliant and tolerating medications without difficulty. She is still active and able to perform her usual daily activities without limitation.  Past Medical History Past Medical History  Diagnosis Date  . Arthritis   . Chronic atrial fibrillation       chronic coumadin anticoagulation  . Hypertension   . Esophageal reflux     with paraesophageal hernia  . Diverticulosis   . Spinal stenosis     with chronic LBP  . Anemia   . Vitamin D deficiency   . Osteoporosis   . UTI (lower urinary tract infection)   . Tachy-brady syndrome   . Varicose veins   . Fatigue   . History of RIND (reversible ischemic neurological deficit)   . Hypothyroidism   . Complication of anesthesia     "very sensitive to it; hard for me to wakeup"  . Breast cancer     right  . Kidney stones   . Angina   . Pacemaker -Medtronic     DOI 2003/ Change out 2013  . UTI (lower urinary tract infection)     Past Surgical History Past Surgical History  Procedure Laterality Date  . Eye surgery      cataract bilateral  . Breast lumpectomy      right  . Tonsillectomy      "as a child"  . Appendectomy  1939  . Oophorectomy  1951    right; "had tumor there"  . Insert / replace / remove pacemaker Left       Dr Caryl Comes is cardiologist    Allergies/Intolerances Allergies  Allergen Reactions  . Amiodarone Other (See Comments)    dizziness  . Beet Other (See Comments)    unknown  . Benzyl Alc-Guai-Pse Other (See Comments)    unknown  . Caffeine Other (See Comments)    unknown  . Chlordiazepoxide-Clidinium Other (See Comments)    unknown  . Chocolate Other (See Comments)    unknown  . Codeine Phosphate Other (See Comments)    unknown  . Darvon Other (See Comments)    unknown  . Dilaudid [Hydromorphone Hcl] Other (See Comments)    unknown  . Dimetapp Cold-Allergy Other (See Comments)    unknown  . Dust Mite Extract Other (See Comments)    unknown  . Estrogens Other (See Comments)    unknown  . Fungizone [Amphotericin B] Other (See Comments)    unknown  . Iodine Other (See Comments)    unknown  . Micardis [Telmisartan] Other (See Comments)    Fatigue and back pain  . Milk [Dairy] Other (See Comments)    unknown  . Mold Extract [Trichophyton Mentagrophyte] Other (See Comments)    unknown  . Motrin [Ibuprofen] Other (See Comments)    unknown  . Penicillins Other (See Comments)    unknown  . Pentazocine  Lactate Other (See Comments)    unknown  . Petroleum Jelly [Petrolatum] Other (See Comments)    unknown  . Pineapple Other (See Comments)    unknown  . Plavix [Clopidogrel Bisulfate] Other (See Comments)    dizziness  . Prednisone Other (See Comments)    unknown  . Soap Other (See Comments)    unknown  . Sulfamethoxazole Other (See Comments)    unknown  . Tagamet [Cimetidine] Other (See Comments)    unknown  . Tape Other (See Comments)    unknown  . Watermelon Concentrate Other (See Comments)    unknown  . Zithromax [Azithromycin Dihydrate] Other (See Comments)    unknown  . Dyazide [Hydrochlorothiazide W-Triamterene] Rash  . Erythromycin Other (See Comments)    unknown    Current Home Medications Current Outpatient Prescriptions  Medication Sig Dispense  Refill  . albuterol (PROVENTIL HFA;VENTOLIN HFA) 108 (90 BASE) MCG/ACT inhaler Inhale 1 puff into the lungs every 6 (six) hours as needed for wheezing or shortness of breath.  1 Inhaler  0  . cholecalciferol (VITAMIN D) 400 UNITS TABS Take 400 Units by mouth daily.      . diclofenac sodium (VOLTAREN) 1 % GEL Place 1 application onto the skin 2 (two) times daily as needed. For knee pain      . diltiazem (CARDIZEM CD) 240 MG 24 hr capsule Take 240 mg by mouth daily.        . furosemide (LASIX) 20 MG tablet Take 20 mg by mouth 1 day or 1 dose.      Marland Kitchen Hypromellose (GENTEAL) 0.3 % SOLN Place 1 drop into both eyes daily.       Marland Kitchen levothyroxine (SYNTHROID, LEVOTHROID) 75 MCG tablet Take 37.5 mcg by mouth daily.      . Multiple Vitamins-Minerals (ICAPS AREDS FORMULA PO) Take 1 capsule by mouth daily.      Marland Kitchen oxybutynin (OXYTROL) 3.9 MG/24HR Place 1 patch onto the skin 1 day or 1 dose.      . oxyCODONE (OXY IR/ROXICODONE) 5 MG immediate release tablet Take 5 mg by mouth 3 (three) times daily as needed. For pain      . ranitidine (ZANTAC) 150 MG tablet Take 150 mg by mouth 2 (two) times daily.      . sertraline (ZOLOFT) 25 MG tablet Take 12.5 mg by mouth daily.      Marland Kitchen warfarin (COUMADIN) 3 MG tablet Take 1 tablet (3 mg total) by mouth daily.      . [DISCONTINUED] atenolol (TENORMIN) 25 MG tablet Take 1 tablet (25 mg total) by mouth daily.  30 tablet  0  . [DISCONTINUED] ferrous sulfate 325 (65 FE) MG tablet Take 1 tablet (325 mg total) by mouth 2 (two) times daily with a meal.  60 tablet  0   No current facility-administered medications for this visit.    Social History History   Social History  . Marital Status: Married    Spouse Name: N/A    Number of Children: N/A  . Years of Education: N/A   Occupational History  . Not on file.   Social History Main Topics  . Smoking status: Never Smoker   . Smokeless tobacco: Never Used  . Alcohol Use: No  . Drug Use: No  . Sexual Activity: No   Other  Topics Concern  . Not on file   Social History Narrative   Contact person Elon Jester [phone], [phone], she is a good friend. Husband passed away 12-Jun-2011  and pt moved into an apartment, now lives by herself in [facility] No kids. Not many friends or family support.      Review of Systems General: No chills, fever, night sweats or weight changes Cardiovascular: No chest pain, dyspnea on exertion, edema, orthopnea, palpitations, paroxysmal nocturnal dyspnea Dermatological: No rash, lesions or masses Respiratory: No cough, dyspnea Urologic: No hematuria, dysuria Abdominal: No nausea, vomiting, diarrhea, bright red blood per rectum, melena, or hematemesis Neurologic: No visual changes, weakness, changes in mental status All other systems reviewed and are otherwise negative except as noted above.  Physical Exam Vitals: Blood pressure 133/88, pulse 84, weight 165 lb (74.844 kg).  General: Well developed, well appearing 80 y.o. female in no acute distress. HEENT: Normocephalic, atraumatic. EOMs intact. Sclera nonicteric. Oropharynx clear.  Neck: Supple. No JVD. Lungs: Respirations regular and unlabored, CTA bilaterally. No wheezes, rales or rhonchi. Heart: Irregular. S1, S2 present. No murmurs, rub, S3 or S4. Abdomen: Soft, non-distended.  Extremities: No clubbing, cyanosis or edema. PT/Radials 2+ and equal bilaterally. Psych: Normal affect. Neuro: Alert and oriented X 3. Moves all extremities spontaneously.   Diagnostics Device interrogation today - Normal VVI PPM function. Threshold, sensing, impedance consistent with previous measurements. Device programmed to maximize longevity. 15 high ventricular rates noted, longest 34 seconds, available EGMs reviewed and show AFib w/RVR. Device programmed at appropriate safety margins. Histogram distribution appropriate for patient activity level. Device programmed to optimize intrinsic conduction. Estimated longevity 9.5 years.    Assessment and Plan  1. Tachy-brady syndrome s/p PPM implant - normal device function - no programming changes made - continue routine remote PPM follow-up every 3 months - return for follow-up with Dr. Caryl Comes in one year  2. Permanent atrial fibrillation - overall rate controlled with V rates <100 bpm  - continue CCB for rate control - continue warfarin for stroke prevention   Signed, Jadier Rockers, PA-C 10/23/2013, 12:18 PM

## 2013-10-23 NOTE — Patient Instructions (Addendum)
Your physician wants you to follow-up in: 1 year with Dr. Gari Crown will receive a reminder letter in the mail two months in advance. If you don't receive a letter, please call our office to schedule the follow-up appointment.  Your physician recommends that you keep your schedule  follow-up Remote check  appointment in: May 01/21/14    Your physician recommends that you continue on your current medications as directed. Please refer to the Current Medication list given to you today.

## 2013-11-27 ENCOUNTER — Encounter: Payer: Self-pay | Admitting: Internal Medicine

## 2014-01-21 ENCOUNTER — Ambulatory Visit (INDEPENDENT_AMBULATORY_CARE_PROVIDER_SITE_OTHER): Payer: Medicare Other | Admitting: *Deleted

## 2014-01-21 DIAGNOSIS — I4891 Unspecified atrial fibrillation: Secondary | ICD-10-CM

## 2014-01-21 DIAGNOSIS — I495 Sick sinus syndrome: Secondary | ICD-10-CM

## 2014-01-21 LAB — MDC_IDC_ENUM_SESS_TYPE_REMOTE
Battery Remaining Longevity: 115 mo
Battery Voltage: 2.78 V
Date Time Interrogation Session: 20150511135526
Lead Channel Pacing Threshold Amplitude: 0.625 V
Lead Channel Pacing Threshold Pulse Width: 0.4 ms
Lead Channel Sensing Intrinsic Amplitude: 16 mV
MDC IDC MSMT BATTERY IMPEDANCE: 252 Ohm
MDC IDC MSMT LEADCHNL RA IMPEDANCE VALUE: 0 Ohm
MDC IDC MSMT LEADCHNL RV IMPEDANCE VALUE: 621 Ohm
MDC IDC SET LEADCHNL RV PACING AMPLITUDE: 2 V
MDC IDC SET LEADCHNL RV PACING PULSEWIDTH: 0.4 ms
MDC IDC SET LEADCHNL RV SENSING SENSITIVITY: 5.6 mV
MDC IDC STAT BRADY RV PERCENT PACED: 1 %

## 2014-01-21 NOTE — Progress Notes (Signed)
Remote pacemaker transmission.   

## 2014-02-08 ENCOUNTER — Emergency Department (HOSPITAL_COMMUNITY)
Admission: EM | Admit: 2014-02-08 | Discharge: 2014-02-08 | Disposition: A | Discharge status: Home / Self Care | Payer: Medicare Other | Attending: Emergency Medicine | Admitting: Emergency Medicine

## 2014-02-08 ENCOUNTER — Encounter: Payer: Self-pay | Admitting: Cardiology

## 2014-02-08 ENCOUNTER — Emergency Department (HOSPITAL_COMMUNITY): Payer: Medicare Other

## 2014-02-08 ENCOUNTER — Encounter (HOSPITAL_COMMUNITY): Payer: Self-pay | Admitting: Emergency Medicine

## 2014-02-08 DIAGNOSIS — M129 Arthropathy, unspecified: Secondary | ICD-10-CM | POA: Insufficient documentation

## 2014-02-08 DIAGNOSIS — K219 Gastro-esophageal reflux disease without esophagitis: Secondary | ICD-10-CM | POA: Insufficient documentation

## 2014-02-08 DIAGNOSIS — Z862 Personal history of diseases of the blood and blood-forming organs and certain disorders involving the immune mechanism: Secondary | ICD-10-CM | POA: Insufficient documentation

## 2014-02-08 DIAGNOSIS — R609 Edema, unspecified: Secondary | ICD-10-CM | POA: Insufficient documentation

## 2014-02-08 DIAGNOSIS — I10 Essential (primary) hypertension: Secondary | ICD-10-CM | POA: Insufficient documentation

## 2014-02-08 DIAGNOSIS — I4891 Unspecified atrial fibrillation: Secondary | ICD-10-CM | POA: Insufficient documentation

## 2014-02-08 DIAGNOSIS — Z8639 Personal history of other endocrine, nutritional and metabolic disease: Secondary | ICD-10-CM | POA: Insufficient documentation

## 2014-02-08 DIAGNOSIS — Z87442 Personal history of urinary calculi: Secondary | ICD-10-CM | POA: Insufficient documentation

## 2014-02-08 DIAGNOSIS — R7989 Other specified abnormal findings of blood chemistry: Secondary | ICD-10-CM

## 2014-02-08 DIAGNOSIS — Z880 Allergy status to penicillin: Secondary | ICD-10-CM | POA: Insufficient documentation

## 2014-02-08 DIAGNOSIS — I209 Angina pectoris, unspecified: Secondary | ICD-10-CM | POA: Insufficient documentation

## 2014-02-08 DIAGNOSIS — R600 Localized edema: Secondary | ICD-10-CM

## 2014-02-08 DIAGNOSIS — Z7901 Long term (current) use of anticoagulants: Secondary | ICD-10-CM | POA: Insufficient documentation

## 2014-02-08 DIAGNOSIS — Z950 Presence of cardiac pacemaker: Secondary | ICD-10-CM | POA: Insufficient documentation

## 2014-02-08 DIAGNOSIS — R799 Abnormal finding of blood chemistry, unspecified: Secondary | ICD-10-CM | POA: Insufficient documentation

## 2014-02-08 DIAGNOSIS — Z79899 Other long term (current) drug therapy: Secondary | ICD-10-CM | POA: Insufficient documentation

## 2014-02-08 DIAGNOSIS — Z853 Personal history of malignant neoplasm of breast: Secondary | ICD-10-CM | POA: Insufficient documentation

## 2014-02-08 DIAGNOSIS — Z87440 Personal history of urinary (tract) infections: Secondary | ICD-10-CM | POA: Insufficient documentation

## 2014-02-08 DIAGNOSIS — R079 Chest pain, unspecified: Secondary | ICD-10-CM | POA: Insufficient documentation

## 2014-02-08 DIAGNOSIS — E669 Obesity, unspecified: Secondary | ICD-10-CM | POA: Insufficient documentation

## 2014-02-08 DIAGNOSIS — E039 Hypothyroidism, unspecified: Secondary | ICD-10-CM | POA: Insufficient documentation

## 2014-02-08 LAB — COMPREHENSIVE METABOLIC PANEL
ALT: 11 U/L (ref 0–35)
AST: 19 U/L (ref 0–37)
Albumin: 3.2 g/dL — ABNORMAL LOW (ref 3.5–5.2)
Alkaline Phosphatase: 98 U/L (ref 39–117)
BILIRUBIN TOTAL: 0.7 mg/dL (ref 0.3–1.2)
BUN: 13 mg/dL (ref 6–23)
CHLORIDE: 101 meq/L (ref 96–112)
CO2: 29 meq/L (ref 19–32)
Calcium: 8.8 mg/dL (ref 8.4–10.5)
Creatinine, Ser: 0.9 mg/dL (ref 0.50–1.10)
GFR, EST AFRICAN AMERICAN: 63 mL/min — AB (ref >90)
GFR, EST NON AFRICAN AMERICAN: 54 mL/min — AB (ref >90)
GLUCOSE: 112 mg/dL — AB (ref 70–99)
POTASSIUM: 3.8 meq/L (ref 3.7–5.3)
Sodium: 140 mEq/L (ref 137–147)
Total Protein: 6.2 g/dL (ref 6.0–8.3)

## 2014-02-08 LAB — URINALYSIS, ROUTINE W REFLEX MICROSCOPIC
Bilirubin Urine: NEGATIVE
Glucose, UA: NEGATIVE mg/dL
Hgb urine dipstick: NEGATIVE
Ketones, ur: NEGATIVE mg/dL
Nitrite: NEGATIVE
Protein, ur: NEGATIVE mg/dL
Specific Gravity, Urine: 1.015 (ref 1.005–1.030)
UROBILINOGEN UA: 1 mg/dL (ref 0.0–1.0)
pH: 7 (ref 5.0–8.0)

## 2014-02-08 LAB — CBC WITH DIFFERENTIAL/PLATELET
Basophils Absolute: 0 10*3/uL (ref 0.0–0.1)
Basophils Relative: 0 % (ref 0–1)
Eosinophils Absolute: 0.1 10*3/uL (ref 0.0–0.7)
Eosinophils Relative: 1 % (ref 0–5)
HCT: 30.5 % — ABNORMAL LOW (ref 36.0–46.0)
Hemoglobin: 9.3 g/dL — ABNORMAL LOW (ref 12.0–15.0)
LYMPHS ABS: 1.6 10*3/uL (ref 0.7–4.0)
LYMPHS PCT: 33 % (ref 12–46)
MCH: 25.4 pg — AB (ref 26.0–34.0)
MCHC: 30.5 g/dL (ref 30.0–36.0)
MCV: 83.3 fL (ref 78.0–100.0)
Monocytes Absolute: 0.6 10*3/uL (ref 0.1–1.0)
Monocytes Relative: 13 % — ABNORMAL HIGH (ref 3–12)
NEUTROS PCT: 53 % (ref 43–77)
Neutro Abs: 2.5 10*3/uL (ref 1.7–7.7)
Platelets: 219 10*3/uL (ref 150–400)
RBC: 3.66 MIL/uL — AB (ref 3.87–5.11)
RDW: 16 % — ABNORMAL HIGH (ref 11.5–15.5)
WBC: 4.7 10*3/uL (ref 4.0–10.5)

## 2014-02-08 LAB — URINE MICROSCOPIC-ADD ON

## 2014-02-08 LAB — PROTIME-INR
INR: 2.11 — AB (ref 0.00–1.49)
PROTHROMBIN TIME: 23 s — AB (ref 11.6–15.2)

## 2014-02-08 LAB — PRO B NATRIURETIC PEPTIDE: Pro B Natriuretic peptide (BNP): 2218 pg/mL — ABNORMAL HIGH (ref 0–450)

## 2014-02-08 MED ORDER — ALBUTEROL SULFATE (2.5 MG/3ML) 0.083% IN NEBU
5.0000 mg | INHALATION_SOLUTION | Freq: Once | RESPIRATORY_TRACT | Status: AC
  Administered 2014-02-08: 5 mg via RESPIRATORY_TRACT
  Filled 2014-02-08: qty 6

## 2014-02-08 MED ORDER — FUROSEMIDE 20 MG PO TABS: 20.0000 mg | ORAL_TABLET | Freq: Two times a day (BID) | ORAL | Status: DC

## 2014-02-08 NOTE — ED Notes (Signed)
Patients lining has been changed 4 times since she has come it.

## 2014-02-08 NOTE — ED Notes (Signed)
Pt c/o increasing BLE swelling and intermittent central, shooting chest pain x 1 week.  Pain score 5/10.  Sts chest pain has resolved.  Pt was seen by PCP yesterday for same.

## 2014-02-08 NOTE — Discharge Instructions (Signed)
As discussed, it is important that you follow up with your physician for continued management of your condition.  If you develop any new, or concerning changes in your condition, please return to the emergency department immediately.   Edema Edema is an abnormal build-up of fluids in tissues. Because this is partly dependent on gravity (water flows to the lowest place), it is more common in the legs and thighs (lower extremities). It is also common in the looser tissues, like around the eyes. Painless swelling of the feet and ankles is common and increases as a person ages. It may affect both legs and may include the calves or even thighs. When squeezed, the fluid may move out of the affected area and may leave a dent for a few moments. CAUSES   Prolonged standing or sitting in one place for extended periods of time. Movement helps pump tissue fluid into the veins, and absence of movement prevents this, resulting in edema.  Varicose veins. The valves in the veins do not work as well as they should. This causes fluid to leak into the tissues.  Fluid and salt overload.  Injury, burn, or surgery to the leg, ankle, or foot, may damage veins and allow fluid to leak out.  Sunburn damages vessels. Leaky vessels allow fluid to go out into the sunburned tissues.  Allergies (from insect bites or stings, medications or chemicals) cause swelling by allowing vessels to become leaky.  Protein in the blood helps keep fluid in your vessels. Low protein, as in malnutrition, allows fluid to leak out.  Hormonal changes, including pregnancy and menstruation, cause fluid retention. This fluid may leak out of vessels and cause edema.  Medications that cause fluid retention. Examples are sex hormones, blood pressure medications, steroid treatment, or anti-depressants.  Some illnesses cause edema, especially heart failure, kidney disease, or liver disease.  Surgery that cuts veins or lymph nodes, such as  surgery done for the heart or for breast cancer, may result in edema. DIAGNOSIS  Your caregiver is usually easily able to determine what is causing your swelling (edema) by simply asking what is wrong (getting a history) and examining you (doing a physical). Sometimes x-rays, EKG (electrocardiogram or heart tracing), and blood work may be done to evaluate for underlying medical illness. TREATMENT  General treatment includes:  Leg elevation (or elevation of the affected body part).  Restriction of fluid intake.  Prevention of fluid overload.  Compression of the affected body part. Compression with elastic bandages or support stockings squeezes the tissues, preventing fluid from entering and forcing it back into the blood vessels.  Diuretics (also called water pills or fluid pills) pull fluid out of your body in the form of increased urination. These are effective in reducing the swelling, but can have side effects and must be used only under your caregiver's supervision. Diuretics are appropriate only for some types of edema. The specific treatment can be directed at any underlying causes discovered. Heart, liver, or kidney disease should be treated appropriately. HOME CARE INSTRUCTIONS   Elevate the legs (or affected body part) above the level of the heart, while lying down.  Avoid sitting or standing still for prolonged periods of time.  Avoid putting anything directly under the knees when lying down, and do not wear constricting clothing or garters on the upper legs.  Exercising the legs causes the fluid to work back into the veins and lymphatic channels. This may help the swelling go down.  The pressure applied by  elastic bandages or support stockings can help reduce ankle swelling.  A low-salt diet may help reduce fluid retention and decrease the ankle swelling.  Take any medications exactly as prescribed. SEEK MEDICAL CARE IF:  Your edema is not responding to recommended  treatments. SEEK IMMEDIATE MEDICAL CARE IF:   You develop shortness of breath or chest pain.  You cannot breathe when you lay down; or if, while lying down, you have to get up and go to the window to get your breath.  You are having increasing swelling without relief from treatment.  You develop a fever over 102 F (38.9 C).  You develop pain or redness in the areas that are swollen.  Tell your caregiver right away if you have gained 03 lb/1.4 kg in 1 day or 05 lb/2.3 kg in a week. MAKE SURE YOU:   Understand these instructions.  Will watch your condition.  Will get help right away if you are not doing well or get worse. Document Released: 08/30/2005 Document Revised: 02/29/2012 Document Reviewed: 04/17/2008 [facility] Patient Information 2014 [name].

## 2014-02-08 NOTE — ED Provider Notes (Signed)
CSN: [id number]     Arrival date & time 02/08/14  1037 History   First MD Initiated Contact with Patient 02/08/14 1122     Chief Complaint  Patient presents with  . Leg Swelling     (Consider location/radiation/quality/duration/timing/severity/associated sxs/prior Treatment) HPI Patient presents with concerns of increasing lower extremity edema, occasional chest pain.  Patient has chronic lower extremity edema, nose and was worse earlier this week, without clear precipitant.  Since that episode the swelling has actually decreased, and the patient was discomfort in both legs.  There is no asymmetry, no color changes, no new pain in the legs. Patient has had episodic chest discomfort, but no pain over the past week, with no associated new dyspnea. Patient has diminished ambulatory capacity since onset of Sx.  Past Medical History  Diagnosis Date  . Arthritis   . Chronic atrial fibrillation       chronic coumadin anticoagulation  . Hypertension   . Esophageal reflux     with paraesophageal hernia  . Diverticulosis   . Spinal stenosis     with chronic LBP  . Anemia   . Vitamin D deficiency   . Osteoporosis   . UTI (lower urinary tract infection)   . Tachy-brady syndrome   . Varicose veins   . Fatigue   . History of RIND (reversible ischemic neurological deficit)   . Hypothyroidism   . Complication of anesthesia     "very sensitive to it; hard for me to wakeup"  . Breast cancer     right  . Kidney stones   . Angina   . Pacemaker -[employer]tronic     DOI 2003/ Change out 2013  . UTI (lower urinary tract infection)    Past Surgical History  Procedure Laterality Date  . Eye surgery      cataract bilateral  . Breast lumpectomy      right  . Tonsillectomy      "as a child"  . Appendectomy  1939  . Oophorectomy  1951    right; "had tumor there"  . Insert / replace / remove pacemaker Left     Dr Caryl Comes is cardiologist   Family History  Problem Relation Age of Onset  . Cancer  Sister    History  Substance Use Topics  . Smoking status: Never Smoker   . Smokeless tobacco: Never Used  . Alcohol Use: No   OB History   Grav Para Term Preterm Abortions TAB SAB Ect Mult Living                 Review of Systems  Constitutional:       Per HPI, otherwise negative  HENT:       Per HPI, otherwise negative  Respiratory:       Per HPI, otherwise negative  Cardiovascular:       Per HPI, otherwise negative  Gastrointestinal: Negative for vomiting.  Endocrine:       Negative aside from HPI  Genitourinary:       Neg aside from HPI   Musculoskeletal:       Per HPI, otherwise negative  Skin: Negative for rash and wound.  Neurological: Negative for syncope.      Allergies  Ceftin; Amiodarone; Beet; Benzyl alc-guai-pse; Caffeine; Chlordiazepoxide-clidinium; Chocolate; Codeine phosphate; Darvon; Dilaudid; Dimetapp cold-allergy; Dust mite extract; Estrogens; Fungizone; Iodine; Micardis; Milk; Mold extract; Motrin; Nitroglycerin; Penicillins; Pentazocine lactate; Petroleum jelly; Pineapple; Plavix; Prednisone; Soap; Sulfamethoxazole; Tagamet; Tape; Watermelon concentrate; Zithromax; Dyazide; Erythromycin; and  Talwin  Home Medications   Prior to Admission medications   Medication Sig Start Date End Date Taking? Authorizing Provider  albuterol (PROVENTIL HFA;VENTOLIN HFA) 108 (90 BASE) MCG/ACT inhaler Inhale 1 puff into the lungs every 6 (six) hours as needed for wheezing or shortness of breath. 11/02/11 02/08/14 Yes Belkys A Regalado, MD  cholecalciferol (VITAMIN D) 1000 UNITS tablet Take 1,000 Units by mouth daily.   Yes Historical Provider, MD  diclofenac sodium (VOLTAREN) 1 % GEL Place 1 application onto the skin 2 (two) times daily as needed. For knee pain   Yes Historical Provider, MD  diltiazem (CARDIZEM CD) 240 MG 24 hr capsule Take 240 mg by mouth daily.     Yes Historical Provider, MD  furosemide (LASIX) 20 MG tablet Take 20 mg by mouth daily.    Yes Historical  Provider, MD  levothyroxine (SYNTHROID, LEVOTHROID) 75 MCG tablet Take 37.5 mcg by mouth daily.   Yes Historical Provider, MD  loratadine (CLARITIN) 10 MG tablet Take 10 mg by mouth daily.   Yes Historical Provider, MD  Menthol-Methyl Salicylate (MUSCLE RUB) 10-15 % CREA Apply 1 application topically 3 (three) times daily as needed for muscle pain.   Yes Historical Provider, MD  Multiple Vitamins-Minerals (ICAPS AREDS FORMULA PO) Take 1 capsule by mouth daily.   Yes Historical Provider, MD  omeprazole (PRILOSEC) 20 MG capsule Take 20 mg by mouth daily.   Yes Historical Provider, MD  oxybutynin (OXYTROL) 3.9 MG/24HR Place 1 patch onto the skin every 3 (three) days.    Yes Historical Provider, MD  oxyCODONE (OXY IR/ROXICODONE) 5 MG immediate release tablet Take 5 mg by mouth 3 (three) times daily as needed. For pain   Yes Historical Provider, MD  ranitidine (ZANTAC) 150 MG tablet Take 150 mg by mouth 2 (two) times daily.   Yes Historical Provider, MD  sertraline (ZOLOFT) 25 MG tablet Take 12.5 mg by mouth daily.   Yes Historical Provider, MD  warfarin (COUMADIN) 3 MG tablet Take 1 tablet (3 mg total) by mouth daily. 11/27/11  Yes Rogelia Mire, NP   BP 142/70  Pulse 82  Temp(Src) 98 F (36.7 C) (Oral)  Resp 21  SpO2 93% Physical Exam  Nursing note and vitals reviewed. Constitutional:  Obese elderly female resting, in no discomfort.  HENT:  Head: Normocephalic and atraumatic.  Eyes: Pupils are equal, round, and reactive to light. Right eye exhibits no discharge. Left eye exhibits no discharge.  Neck: No tracheal deviation present.  Cardiovascular: Normal rate and intact distal pulses.   Pulmonary/Chest: No accessory muscle usage or stridor. Not tachypneic. No respiratory distress. She has decreased breath sounds. She has wheezes.  Musculoskeletal:  Symmetric bilateral lower extremity edema  Neurological: No cranial nerve deficit. She exhibits normal muscle tone. Coordination normal.   Skin: Skin is warm and dry. No rash noted. No erythema. No pallor.  Psychiatric: She has a normal mood and affect.    ED Course  Procedures (including critical care time) Labs Review Labs Reviewed  COMPREHENSIVE METABOLIC PANEL - Abnormal; Notable for the following:    Glucose, Bld 112 (*)    Albumin 3.2 (*)    GFR calc non Af Amer 54 (*)    GFR calc Af Amer 63 (*)    All other components within normal limits  PRO B NATRIURETIC PEPTIDE - Abnormal; Notable for the following:    Pro B Natriuretic peptide (BNP) 2218.0 (*)    All other components within normal limits  URINALYSIS, ROUTINE W REFLEX MICROSCOPIC - Abnormal; Notable for the following:    APPearance CLOUDY (*)    Leukocytes, UA TRACE (*)    All other components within normal limits  URINE MICROSCOPIC-ADD ON - Abnormal; Notable for the following:    Bacteria, UA FEW (*)    All other components within normal limits  CBC WITH DIFFERENTIAL - Abnormal; Notable for the following:    RBC 3.66 (*)    Hemoglobin 9.3 (*)    HCT 30.5 (*)    MCH 25.4 (*)    RDW 16.0 (*)    Monocytes Relative 13 (*)    All other components within normal limits  PROTIME-INR - Abnormal; Notable for the following:    Prothrombin Time 23.0 (*)    INR 2.11 (*)    All other components within normal limits  CBC WITH DIFFERENTIAL    Imaging Review Dg Chest 2 View  02/08/2014   CLINICAL DATA:  Chest discomfort  EXAM: CHEST  2 VIEW  COMPARISON:  12/14/2013  FINDINGS: Cardiac shadow is enlarged. A pacing device is again seen. Elevation of the right hemidiaphragm is again noted. Right basilar atelectasis is seen. There has been clearing of the previously seen changes in the left base. Postsurgical changes are again noted in the right axilla.  IMPRESSION: Right basilar atelectasis.   Electronically Signed   By: Inez Catalina M.D.   On: 02/08/2014 12:57   I reviewed the patient's electronic medical record.   \2:13 PM Patient sitting upright, eating a  sandwich  MDM   Patient presents with concern of ongoing lower extremity edema.  Over, the patient's edema has actually improved prior to my evaluation.  Patient's labs are suggestive of persistent heart failure.  No evidence of infection, no chest pain, evidence for ongoing coronary ischemia.  The patient was in no distress, eating while here.  With this absence of distress, she was discharged in stable condition.   Carmin Muskrat, MD 02/08/14 1414

## 2014-02-19 ENCOUNTER — Encounter: Payer: Self-pay | Admitting: Internal Medicine

## 2014-04-25 ENCOUNTER — Ambulatory Visit (INDEPENDENT_AMBULATORY_CARE_PROVIDER_SITE_OTHER): Payer: Medicare Other | Admitting: *Deleted

## 2014-04-25 DIAGNOSIS — I495 Sick sinus syndrome: Secondary | ICD-10-CM

## 2014-04-25 NOTE — Progress Notes (Signed)
Remote pacemaker transmission.   

## 2014-04-26 LAB — MDC_IDC_ENUM_SESS_TYPE_REMOTE
Brady Statistic RV Percent Paced: 0.9 %
Lead Channel Impedance Value: 645 Ohm
Lead Channel Sensing Intrinsic Amplitude: 16 mV
Lead Channel Setting Pacing Pulse Width: 0.4 ms
Lead Channel Setting Sensing Sensitivity: 5.6 mV
MDC IDC MSMT BATTERY REMAINING LONGEVITY: 113 mo
MDC IDC MSMT BATTERY VOLTAGE: 2.78 V
MDC IDC MSMT LEADCHNL RV PACING THRESHOLD AMPLITUDE: 0.5 V
MDC IDC MSMT LEADCHNL RV PACING THRESHOLD PULSEWIDTH: 0.4 ms
MDC IDC SET LEADCHNL RV PACING AMPLITUDE: 2 V

## 2014-05-07 ENCOUNTER — Encounter: Payer: Self-pay | Admitting: Cardiology

## 2014-05-15 ENCOUNTER — Encounter: Payer: Self-pay | Admitting: Internal Medicine

## 2014-06-19 ENCOUNTER — Emergency Department (HOSPITAL_COMMUNITY)
Admission: EM | Admit: 2014-06-19 | Discharge: 2014-06-19 | Disposition: A | Discharge status: Home / Self Care | Payer: Medicare Other | Attending: Emergency Medicine | Admitting: Emergency Medicine

## 2014-06-19 ENCOUNTER — Emergency Department (HOSPITAL_COMMUNITY): Payer: Medicare Other

## 2014-06-19 ENCOUNTER — Encounter (HOSPITAL_COMMUNITY): Payer: Self-pay | Admitting: Emergency Medicine

## 2014-06-19 DIAGNOSIS — E039 Hypothyroidism, unspecified: Secondary | ICD-10-CM | POA: Diagnosis not present

## 2014-06-19 DIAGNOSIS — R51 Headache: Secondary | ICD-10-CM | POA: Insufficient documentation

## 2014-06-19 DIAGNOSIS — Z87440 Personal history of urinary (tract) infections: Secondary | ICD-10-CM | POA: Insufficient documentation

## 2014-06-19 DIAGNOSIS — Z950 Presence of cardiac pacemaker: Secondary | ICD-10-CM | POA: Insufficient documentation

## 2014-06-19 DIAGNOSIS — K219 Gastro-esophageal reflux disease without esophagitis: Secondary | ICD-10-CM | POA: Diagnosis not present

## 2014-06-19 DIAGNOSIS — I4891 Unspecified atrial fibrillation: Secondary | ICD-10-CM | POA: Insufficient documentation

## 2014-06-19 DIAGNOSIS — B0239 Other herpes zoster eye disease: Secondary | ICD-10-CM | POA: Diagnosis not present

## 2014-06-19 DIAGNOSIS — I10 Essential (primary) hypertension: Secondary | ICD-10-CM | POA: Insufficient documentation

## 2014-06-19 DIAGNOSIS — Z853 Personal history of malignant neoplasm of breast: Secondary | ICD-10-CM | POA: Diagnosis not present

## 2014-06-19 DIAGNOSIS — Z79899 Other long term (current) drug therapy: Secondary | ICD-10-CM | POA: Diagnosis not present

## 2014-06-19 DIAGNOSIS — Z87442 Personal history of urinary calculi: Secondary | ICD-10-CM | POA: Insufficient documentation

## 2014-06-19 DIAGNOSIS — Z7951 Long term (current) use of inhaled steroids: Secondary | ICD-10-CM | POA: Diagnosis not present

## 2014-06-19 DIAGNOSIS — Z791 Long term (current) use of non-steroidal anti-inflammatories (NSAID): Secondary | ICD-10-CM | POA: Diagnosis not present

## 2014-06-19 DIAGNOSIS — Z862 Personal history of diseases of the blood and blood-forming organs and certain disorders involving the immune mechanism: Secondary | ICD-10-CM | POA: Insufficient documentation

## 2014-06-19 DIAGNOSIS — M1990 Unspecified osteoarthritis, unspecified site: Secondary | ICD-10-CM | POA: Insufficient documentation

## 2014-06-19 DIAGNOSIS — Z7901 Long term (current) use of anticoagulants: Secondary | ICD-10-CM | POA: Diagnosis not present

## 2014-06-19 DIAGNOSIS — Z880 Allergy status to penicillin: Secondary | ICD-10-CM | POA: Diagnosis not present

## 2014-06-19 DIAGNOSIS — E559 Vitamin D deficiency, unspecified: Secondary | ICD-10-CM | POA: Insufficient documentation

## 2014-06-19 DIAGNOSIS — B029 Zoster without complications: Secondary | ICD-10-CM

## 2014-06-19 DIAGNOSIS — R21 Rash and other nonspecific skin eruption: Secondary | ICD-10-CM | POA: Diagnosis present

## 2014-06-19 LAB — I-STAT CHEM 8, ED
BUN: 17 mg/dL (ref 6–23)
CHLORIDE: 98 meq/L (ref 96–112)
Calcium, Ion: 1.12 mmol/L — ABNORMAL LOW (ref 1.13–1.30)
Creatinine, Ser: 1.3 mg/dL — ABNORMAL HIGH (ref 0.50–1.10)
GLUCOSE: 115 mg/dL — AB (ref 70–99)
HCT: 33 % — ABNORMAL LOW (ref 36.0–46.0)
HEMOGLOBIN: 11.2 g/dL — AB (ref 12.0–15.0)
Potassium: 4.4 mEq/L (ref 3.7–5.3)
Sodium: 133 mEq/L — ABNORMAL LOW (ref 137–147)
TCO2: 26 mmol/L (ref 0–100)

## 2014-06-19 LAB — PROTIME-INR
INR: 2.2 — ABNORMAL HIGH (ref 0.00–1.49)
PROTHROMBIN TIME: 24.4 s — AB (ref 11.6–15.2)

## 2014-06-19 MED ORDER — FLUORESCEIN SODIUM 1 MG OP STRP
1.0000 | ORAL_STRIP | Freq: Once | OPHTHALMIC | Status: AC
  Administered 2014-06-19: 1 via OPHTHALMIC
  Filled 2014-06-19: qty 1

## 2014-06-19 MED ORDER — TETRACAINE HCL 0.5 % OP SOLN
2.0000 [drp] | Freq: Once | OPHTHALMIC | Status: AC
  Administered 2014-06-19: 2 [drp] via OPHTHALMIC
  Filled 2014-06-19: qty 2

## 2014-06-19 NOTE — ED Notes (Signed)
Pt brought in by caregiver for eval of increased pain following visit with PCP and dx of shingles. Pt noted to have red rash to left eye lid and forehead, pt also noted to have rash in scalp. Pt given antibiotic prescription and pain meds by PCP and has been given first dose by caregiver.  nad noted. Pt denies any blurred vision or vision loss.

## 2014-06-19 NOTE — ED Notes (Signed)
Pt has returned from CT.  

## 2014-06-19 NOTE — ED Notes (Signed)
Pt is restless. Pt states that she wants to go home, and that it is not fair for her to have been here this long.

## 2014-06-19 NOTE — ED Provider Notes (Signed)
CSN: [id number]     Arrival date & time 06/19/14  1820 History  This chart was scribed for non-physician practitioner, Dahlia Bailiff, PA-C working with Ernestina Patches, MD by Frederich Balding, ED scribe. This patient was seen in room TR06C/TR06C and the patient's care was started at 7:36 PM.   Chief Complaint  Patient presents with  . Herpes Zoster   The history is provided by the patient. No language interpreter was used.   HPI Comments: [name] is a 80 y.o. female who presents to the Emergency Department complaining of pain behind her left eye and headache that started 5 days ago. Caretaker states the headache started before the eye pain. Reports a rash around her eye that started a few days ago. States she has some blurry vision in her left eye that started today. Pt was diagnosed with a sinus infection a few days after the symptoms started and then shingles by her PCP today and given an ophthalmology referral. States she tried going to the ophthalmologist but was told she needed to be seen before her appointment with them tomorrow. Pt was given Levaquin and told to come to the ED. Denies loss of vision.   Past Medical History  Diagnosis Date  . Arthritis   . Chronic atrial fibrillation       chronic coumadin anticoagulation  . Hypertension   . Esophageal reflux     with paraesophageal hernia  . Diverticulosis   . Spinal stenosis     with chronic LBP  . Anemia   . Vitamin D deficiency   . Osteoporosis   . UTI (lower urinary tract infection)   . Tachy-brady syndrome   . Varicose veins   . Fatigue   . History of RIND (reversible ischemic neurological deficit)   . Hypothyroidism   . Complication of anesthesia     "very sensitive to it; hard for me to wakeup"  . Breast cancer     right  . Kidney stones   . Angina   . Pacemaker -[employer]tr[employer]     DOI 2003/ Change out 2013  . UTI (lower urinary tract infection)    Past Surgical History  Procedure Laterality Date  . Eye  surgery      cataract bilateral  . Breast lumpectomy      right  . Tonsillectomy      "as a child"  . Appendectomy  1939  . Oophorectomy  1951    right; "had tumor there"  . Insert / replace / remove pacemaker Left     Dr Caryl Comes is cardiologist   Family History  Problem Relation Age of Onset  . Cancer Sister    History  Substance Use Topics  . Smoking status: Never Smoker   . Smokeless tobacco: Never Used  . Alcohol Use: No   OB History   Grav Para Term Preterm Abortions TAB SAB Ect Mult Living                 Review of Systems  Eyes: Positive for pain and visual disturbance.  Skin: Positive for rash.  Neurological: Positive for headaches.  All other systems reviewed and are negative.  Allergies  Ceftin; Amiodarone; Beet; Benzyl alc-guai-pse; Caffeine; Chlordiazepoxide-clidinium; Chocolate; Codeine phosphate; Darvon; Dilaudid; Dimetapp cold-allergy; Dust mite extract; Estrogens; Fungizone; Iodine; Micardis; Milk; Mold extract; Motrin; Nitroglycerin; Penicillins; Pentazocine lactate; Petroleum jelly; Pineapple; Plavix; Prednisone; Soap; Sulfamethoxazole; Tagamet; Tape; Watermelon concentrate; Zithromax; Dyazide; Erythromycin; and Talwin  Home Medications   Prior to  Admission medications   Medication Sig Start Date End Date Taking? Authorizing Provider  albuterol (PROVENTIL HFA;VENTOLIN HFA) 108 (90 BASE) MCG/ACT inhaler Inhale 1 puff into the lungs every 6 (six) hours as needed for wheezing or shortness of breath.   Yes Historical Provider, MD  cholecalciferol (VITAMIN D) 1000 UNITS tablet Take 1,000 Units by mouth daily.   Yes Historical Provider, MD  diclofenac sodium (VOLTAREN) 1 % GEL Place 1 application onto the skin 2 (two) times daily as needed. For knee pain   Yes Historical Provider, MD  diltiazem (CARDIZEM CD) 240 MG 24 hr capsule Take 240 mg by mouth daily.     Yes Historical Provider, MD  famotidine (PEPCID) 10 MG tablet Take 10 mg by mouth daily as needed for  heartburn.   Yes Historical Provider, MD  fluticasone (VERAMYST) 27.5 MCG/SPRAY nasal spray Place 2 sprays into the nose daily.   Yes Historical Provider, MD  furosemide (LASIX) 20 MG tablet Take 1 tablet (20 mg total) by mouth 2 (two) times daily. 02/08/14  Yes Carmin Muskrat, MD  levothyroxine (SYNTHROID, LEVOTHROID) 75 MCG tablet Take 37.5 mcg by mouth daily.   Yes Historical Provider, MD  loratadine (CLARITIN) 10 MG tablet Take 10 mg by mouth daily.   Yes Historical Provider, MD  Menthol-Methyl Salicylate (MUSCLE RUB) 10-15 % CREA Apply 1 application topically 3 (three) times daily as needed for muscle pain.   Yes Historical Provider, MD  Methylfol-Algae-B12-Acetylcyst (CEREFOLIN NAC) 6-90.314-2-600 MG TABS Take 1 tablet by mouth daily.   Yes Historical Provider, MD  Multiple Vitamins-Minerals (ICAPS AREDS FORMULA PO) Take 1 capsule by mouth daily.   Yes Historical Provider, MD  omeprazole (PRILOSEC) 20 MG capsule Take 20 mg by mouth daily.   Yes Historical Provider, MD  oxybutynin (OXYTROL) 3.9 MG/24HR Place 1 patch onto the skin every 3 (three) days.    Yes Historical Provider, MD  ranitidine (ZANTAC) 150 MG tablet Take 150 mg by mouth 2 (two) times daily.   Yes Historical Provider, MD  sertraline (ZOLOFT) 25 MG tablet Take 12.5 mg by mouth daily.   Yes Historical Provider, MD  trandolapril (MAVIK) 1 MG tablet Take 1 mg by mouth daily.   Yes Historical Provider, MD  warfarin (COUMADIN) 3 MG tablet Take 3 mg by mouth See admin instructions. 11/27/11  Yes Rogelia Mire, NP  albuterol (PROVENTIL HFA;VENTOLIN HFA) 108 (90 BASE) MCG/ACT inhaler Inhale 1 puff into the lungs every 6 (six) hours as needed for wheezing or shortness of breath. 11/02/11 02/08/14  Belkys A Regalado, MD   BP 147/67  Pulse 66  Temp(Src) 97.9 F (36.6 C)  Resp 20  Ht 5\' 6"  (1.676 m)  Wt 170 lb (77.111 kg)  BMI 27.45 kg/m2  SpO2 95%  Physical Exam  Nursing note and vitals reviewed. Constitutional: She is  oriented to person, place, and time. She appears well-developed and well-nourished. No distress.  HENT:  Head: Normocephalic and atraumatic.  Eyes: Conjunctivae are normal. Pupils are equal, round, and reactive to light. Right eye exhibits abnormal extraocular motion. Left eye exhibits abnormal extraocular motion.  Slit lamp exam:      The left eye shows no corneal abrasion, no corneal flare, no corneal ulcer, no foreign body, no hyphema, no hypopyon, no fluorescein uptake and no anterior chamber bulge.  Patient has difficulty with lateral extraocular motion to left side. Patient reporting blurred vision in her left eye and eye pain. On slitlamp exam there no abrasions, or dendritic lesions.  Neck: Neck supple. No tracheal deviation present.  Cardiovascular: Normal rate, regular rhythm and normal heart sounds.   Pulmonary/Chest: Effort normal and breath sounds normal. No respiratory distress. She has no wheezes. She has no rales.  Musculoskeletal: Normal range of motion.  Neurological: She is alert and oriented to person, place, and time.  Skin: Skin is warm and dry.  Psychiatric: She has a normal mood and affect. Her behavior is normal.    ED Course  Procedures (including critical care time)  DIAGNOSTIC STUDIES: Oxygen Saturation is 94% on RA, adequate by my interpretation.    COORDINATION OF CARE: 7:46 PM-Discussed treatment plan which includes basic labs, CT head without contrast, slitlamp exam. with pt at bedside and pt agreed to plan.   Labs Review Labs Reviewed  PROTIME-INR - Abnormal; Notable for the following:    Prothrombin Time 24.4 (*)    INR 2.20 (*)    All other components within normal limits  I-STAT CHEM 8, ED - Abnormal; Notable for the following:    Sodium 133 (*)    Creatinine, Ser 1.30 (*)    Glucose, Bld 115 (*)    Calcium, Ion 1.12 (*)    Hemoglobin 11.2 (*)    HCT 33.0 (*)    All other components within normal limits    Imaging Review Ct Head Wo  Contrast  06/19/2014   CLINICAL DATA:  Progressive pain; Herpes zoster over left face  EXAM: CT HEAD WITHOUT CONTRAST  TECHNIQUE: Contiguous axial images were obtained from the base of the skull through the vertex without intravenous contrast.  COMPARISON:  October 09, 2006  FINDINGS: Moderate diffuse atrophy is stable. There is no mass, hemorrhage, extra-axial fluid collection, or midline shift. There is small vessel disease throughout the centra semiovale bilaterally as well as lacunar type infarcts in both lentiform nuclei and internal capsules, stable. There is small vessel disease in the thalami as well. There is no acute appearing infarct.  . Patient appears to have had partial mastoidectomies bilaterally. Aerated mastoids appear clear bilaterally. There is mild posterior right ethmoid sinus disease. 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  IMPRESSION: Atrophy with supratentorial small vessel disease, stable. No intracranial mass, hemorrhage, or acute appearing infarct. Mild right ethmoid sinus disease. Evidence of prior mastoid surgery without focal change in the appearance of mastoids compared to prior study. Torus palatinus noted incidentally.   Electronically Signed   By: Lowella Grip M.D.   On: 06/19/2014 21:21     EKG Interpretation None      MDM   Final diagnoses:  Herpes zoster   Patient here with left eye pain, blurred vision, shingles infection diagnosed by PCP. Patient called ophthalmology to be seen in clinic, however ophthalmologist recommended the patient go to the ER for emergent ophthalmology consult. Patient states her eye pain began Saturday, with a headache, and she's noticed a gradual blurred vision since then. Patient denies loss of vision. Patient reports mild prodrome of pruritus above her eye, and now has a mildly excoriated rash above her eye. Patient does not meet criteria for fast track, Will send patient to regular ED and consult ophthalmology.  Further  workup with PT/INR, i-STAT chem 8, CT head without contrast.  8:40 PM: And Dr. Posey Pronto spoke with with ophthalmology. Dr. Posey Pronto recommends slit lamp exam, and if no dendritic lesions are present have patient followup as outpatient tomorrow with her in clinic.  8:50 PM: Slitlamp exam unremarkable in patient's left eye. No obvious  corneal abrasions or dendritic lesions present.  9:21 PM: CT head impression: Atrophy with supratentorial small vessel disease, stable. No intracranial mass, hemorrhage, or acute appearing infarct. Mild right ethmoid sinus disease. Evidence of prior mastoid surgery without focal change in the appearance of mastoids compared to prior study. Torus palatinus noted incidentally.  With CT results stable, patient stating her pain is feeling better after pain medication, we'll discharge patient and have her followup with ophthalmology tomorrow. Patient given strict return precautions, which were also given to her caregiver in the room. They're both strongly encouraged to call or return to the ER should patient develop any numbness, tingling, loss of vision, a severe eye pain, headache, dizziness, weakness, slurred speech, change in mental status. I also notified patient and her caregiver of her elevated renal function. I strongly encouraged patient to followup with this with her primary care physician. I encouraged patient and her caregiver to return to ER should they have any other questions or concerns.  BP 147/67  Pulse 66  Temp(Src) 97.9 F (36.6 C)  Resp 20  Ht 5\' 6"  (1.676 m)  Wt 170 lb (77.111 kg)  BMI 27.45 kg/m2  SpO2 95%  Signed,  Dahlia Bailiff, PA-C 2:37 AM  Patient seen and discussed with Dr. Ernestina Patches, M.D.  I personally performed the services described in this documentation, which was scribed in my presence. The recorded information has been reviewed and is accurate.  Carrie Mew, PA-C 06/20/14 [phone]

## 2014-06-19 NOTE — Discharge Instructions (Signed)
Followup with ophthalmology tomorrow. Return to the ER if you develop any loss of vision, dizziness, headache, high fever, worsening eye pain.  Shingles Shingles (herpes zoster) is an infection that is caused by the same virus that causes chickenpox (varicella). The infection causes a painful skin rash and fluid-filled blisters, which eventually break open, crust over, and heal. It may occur in any area of the body, but it usually affects only one side of the body or face. The pain of shingles usually lasts about 1 month. However, some people with shingles may develop long-term (chronic) pain in the affected area of the body. Shingles often occurs many years after the person had chickenpox. It is more common:  In people older than 50 years.  In people with weakened immune systems, such as those with HIV, AIDS, or cancer.  In people taking medicines that weaken the immune system, such as transplant medicines.  In people under great stress. CAUSES  Shingles is caused by the varicella zoster virus (VZV), which also causes chickenpox. After a person is infected with the virus, it can remain in the person's body for years in an inactive state (dormant). To cause shingles, the virus reactivates and breaks out as an infection in a nerve root. The virus can be spread from person to person (contagious) through contact with open blisters of the shingles rash. It will only spread to people who have not had chickenpox. When these people are exposed to the virus, they may develop chickenpox. They will not develop shingles. Once the blisters scab over, the person is no longer contagious and cannot spread the virus to others. SIGNS AND SYMPTOMS  Shingles shows up in stages. The initial symptoms may be pain, itching, and tingling in an area of the skin. This pain is usually described as burning, stabbing, or throbbing.In a few days or weeks, a painful red rash will appear in the area where the pain, itching, and  tingling were felt. The rash is usually on one side of the body in a band or belt-like pattern. Then, the rash usually turns into fluid-filled blisters. They will scab over and dry up in approximately 2-3 weeks. Flu-like symptoms may also occur with the initial symptoms, the rash, or the blisters. These may include:  Fever.  Chills.  Headache.  Upset stomach. DIAGNOSIS  Your health care provider will perform a skin exam to diagnose shingles. Skin scrapings or fluid samples may also be taken from the blisters. This sample will be examined under a microscope or sent to a lab for further testing. TREATMENT  There is no specific cure for shingles. Your health care provider will likely prescribe medicines to help you manage the pain, recover faster, and avoid long-term problems. This may include antiviral drugs, anti-inflammatory drugs, and pain medicines. HOME CARE INSTRUCTIONS   Take a cool bath or apply cool compresses to the area of the rash or blisters as directed. This may help with the pain and itching.   Take medicines only as directed by your health care provider.   Rest as directed by your health care provider.  Keep your rash and blisters clean with mild soap and cool water or as directed by your health care provider.  Do not pick your blisters or scratch your rash. Apply an anti-itch cream or numbing creams to the affected area as directed by your health care provider.  Keep your shingles rash covered with a loose bandage (dressing).  Avoid skin contact with:  Babies.  Pregnant women.   Children with eczema.   Elderly people with transplants.   People with chronic illnesses, such as leukemia or AIDS.   Wear loose-fitting clothing to help ease the pain of material rubbing against the rash.  Keep all follow-up visits as directed by your health care provider.If the area involved is on your face, you may receive a referral for a specialist, such as an eye doctor  (ophthalmologist) or an ear, nose, and throat (ENT) doctor. Keeping all follow-up visits will help you avoid eye problems, chronic pain, or disability.  SEEK IMMEDIATE MEDICAL CARE IF:   You have facial pain, pain around the eye area, or loss of feeling on one side of your face.  You have ear pain or ringing in your ear.  You have loss of taste.  Your pain is not relieved with prescribed medicines.   Your redness or swelling spreads.   You have more pain and swelling.  Your condition is worsening or has changed.   You have a fever. MAKE SURE YOU:  Understand these instructions.  Will watch your condition.  Will get help right away if you are not doing well or get worse. Document Released: 08/30/2005 Document Revised: 01/14/2014 Document Reviewed: 04/13/2012 [facility] Patient Information 2015 [place], [place]. This information is not intended to replace advice given to you by your health care provider. Make sure you discuss any questions you have with your health care provider.

## 2014-06-19 NOTE — ED Notes (Signed)
Caregivers around the clock

## 2014-06-19 NOTE — ED Notes (Signed)
Pt stated that she had to use the bathroom.  Once in the room placed pt on the bed pan and she no longer had to go. Stated, "she feels like she has to go but nothing will come".

## 2014-06-19 NOTE — ED Notes (Signed)
The pt has had pain behind her lt eye since Monday.  She developed a rash around that eye yesterday ??.  Today she was diagnosed with shingles by her medical doctor who referred her to an eye doctor that did noit see her just sent her here to be seen

## 2014-06-19 NOTE — ED Notes (Signed)
The pt  Has had one dose of her prednisone and a percocet

## 2014-06-20 NOTE — ED Provider Notes (Signed)
Medical screening examination/treatment/procedure(s) were conducted as a shared visit with non-physician practitioner(s) and myself.  I personally evaluated the patient during the encounter. Pt presents with several days of facial pain to left side fo forehead/scalp, followed by facila rash today. Pt seen by PCP, diagx w/ shingles and sent to ED for optho eval. She reports pain around eye, denies blurry vision. On PE, Pt in NAD. Cardiopulm exam benign. She has raised, red, vesicular rash w/ crusting on V! distruction on face. PERRL, EOM intact. PA Truitt Leep did not see dendritic lesions on slit lamp exam. Optho notified and she will f/u in office. PCP started pt on antivirals.   EKG Interpretation None        Ernestina Patches, MD 06/20/14 [phone]

## 2014-07-29 ENCOUNTER — Ambulatory Visit (INDEPENDENT_AMBULATORY_CARE_PROVIDER_SITE_OTHER): Payer: Medicare Other | Admitting: *Deleted

## 2014-07-29 ENCOUNTER — Telehealth: Payer: Self-pay | Admitting: Cardiology

## 2014-07-29 DIAGNOSIS — I495 Sick sinus syndrome: Secondary | ICD-10-CM

## 2014-07-29 NOTE — Telephone Encounter (Signed)
Spoke with pt and reminded pt of remote transmission that is due today. Pt verbalized understanding.   

## 2014-07-30 DIAGNOSIS — I495 Sick sinus syndrome: Secondary | ICD-10-CM

## 2014-07-30 NOTE — Progress Notes (Signed)
Remote pacemaker transmission.   

## 2014-07-31 LAB — MDC_IDC_ENUM_SESS_TYPE_REMOTE
Battery Remaining Longevity: 113 mo
Date Time Interrogation Session: 20151117163355
Lead Channel Pacing Threshold Amplitude: 0.5 V
Lead Channel Sensing Intrinsic Amplitude: 16 mV
Lead Channel Setting Sensing Sensitivity: 5.6 mV
MDC IDC MSMT BATTERY IMPEDANCE: 277 Ohm
MDC IDC MSMT BATTERY VOLTAGE: 2.78 V
MDC IDC MSMT LEADCHNL RA IMPEDANCE VALUE: 0 Ohm
MDC IDC MSMT LEADCHNL RV IMPEDANCE VALUE: 630 Ohm
MDC IDC MSMT LEADCHNL RV PACING THRESHOLD PULSEWIDTH: 0.4 ms
MDC IDC SET LEADCHNL RV PACING AMPLITUDE: 2 V
MDC IDC SET LEADCHNL RV PACING PULSEWIDTH: 0.4 ms
MDC IDC STAT BRADY RV PERCENT PACED: 1 %

## 2014-08-01 ENCOUNTER — Encounter: Payer: Self-pay | Admitting: Cardiology

## 2014-08-06 ENCOUNTER — Encounter: Payer: Self-pay | Admitting: Internal Medicine

## 2014-08-15 ENCOUNTER — Encounter: Payer: Self-pay | Admitting: Podiatrist

## 2014-08-15 ENCOUNTER — Ambulatory Visit (INDEPENDENT_AMBULATORY_CARE_PROVIDER_SITE_OTHER): Payer: Medicare Other | Admitting: Podiatrist

## 2014-08-15 VITALS — BP 124/68 | HR 65 | Resp 15 | Ht 64.0 in | Wt 170.0 lb

## 2014-08-15 DIAGNOSIS — M79609 Pain in unspecified limb: Principal | ICD-10-CM

## 2014-08-15 DIAGNOSIS — B351 Tinea unguium: Secondary | ICD-10-CM

## 2014-08-15 NOTE — Progress Notes (Deleted)
   Subjective:    Patient ID: [name], female    DOB: 07/11/1920, 80 y.o.   MRN: [id number]  HPI    Review of Systems  All other systems reviewed and are negative.      Objective:   Physical Exam        Assessment & Plan:

## 2014-08-15 NOTE — Progress Notes (Signed)
Chief Complaint  Patient presents with  . debridement    "She needs her toenails taken care of."  10 toenails     HPI: Patient is 80 y.o. female who presents today for symptomatic toenails.  She was last seen in 2013   Allergies  Allergen Reactions  . Ceftin [Cefuroxime Axetil] Shortness Of Breath and Itching  . Amiodarone Other (See Comments)    dizziness  . Beet Other (See Comments)    unknown  . Benzyl Alc-Guai-Pse Other (See Comments)    unknown  . Caffeine Other (See Comments)    unknown  . Chlordiazepoxide-Clidinium Other (See Comments)    unknown  . Chocolate Other (See Comments)    unknown  . Codeine Phosphate Other (See Comments)    unknown  . Darvon Other (See Comments)    unknown  . Dilaudid [Hydromorphone Hcl] Other (See Comments)    unknown  . Dimetapp Cold-Allergy Other (See Comments)    unknown  . Dust Mite Extract Other (See Comments)    unknown  . Estrogens Other (See Comments)    unknown  . Fungizone [Amphotericin B] Other (See Comments)    unknown  . Iodine Other (See Comments)    unknown  . Micardis [Telmisartan] Other (See Comments)    Fatigue and back pain  . Milk [Dairy] Other (See Comments)    unknown  . Mold Extract [Trichophyton Mentagrophyte] Other (See Comments)    unknown  . Motrin [Ibuprofen] Other (See Comments)    unknown  . Nitroglycerin Other (See Comments)    Head ache   . Penicillins Other (See Comments)    unknown  . Pentazocine Lactate Other (See Comments)    unknown  . Petroleum Jelly [Petrolatum] Other (See Comments)    unknown  . Pineapple Other (See Comments)    unknown  . Plavix [Clopidogrel Bisulfate] Other (See Comments)    dizziness  . Prednisone Other (See Comments)    unknown  . Soap Other (See Comments)    unknown  . Sulfamethoxazole Other (See Comments)    unknown  . Tagamet [Cimetidine] Other (See Comments)    unknown  . Tape Other (See Comments)    unknown  . Watermelon Concentrate Other (See  Comments)    unknown  . Zithromax [Azithromycin Dihydrate] Other (See Comments)    unknown  . Dyazide [Hydrochlorothiazide W-Triamterene] Rash  . Erythromycin Other (See Comments)    unknown  . Talwin [Pentazocine] Rash    Physical Exam  Patient is awake, alert, and oriented x 3.  In no acute distress.  Vascular status is intact with palpable pedal pulses at 1/4 DP and PT bilateral and capillary refill time within normal limits. Neurological sensation is decreased bilaterally via Semmes Weinstein monofilament at45/5 sites. Contracture of lesser digits with prominent metatarsal heads present bilateral.  No interdigital maceration present.  Excellent appearance of cutis noted.  Toenails are elongated, thick, discolored, distrophic especially the right first.  Assessment: symptomatic onychomycosis  Plan: debridement carried out today manually and mechanically.  No iatrogenic bleeding noted.  She will be seen back as needed for follow up.

## 2014-08-15 NOTE — Patient Instructions (Signed)
You have been prescribed a topical pain medications through a specialty compounding pharmacy called [facility] out of [place], [place]. A representative from the pharmacy will call to ensure you would like to receive the medication. If you do not hear from them within two business days please call to confirm they have received the prescription. Their telephone number is [phone].    If you have any other questions or concerns regarding the medication please call our office.

## 2014-08-22 ENCOUNTER — Encounter (HOSPITAL_COMMUNITY): Payer: Self-pay | Admitting: Internal Medicine

## 2014-09-30 ENCOUNTER — Ambulatory Visit (HOSPITAL_BASED_OUTPATIENT_CLINIC_OR_DEPARTMENT_OTHER)
Admission: RE | Admit: 2014-09-30 | Discharge: 2014-09-30 | Disposition: A | Discharge status: Home / Self Care | Payer: Medicare Other | Source: Ambulatory Visit | Attending: Family Medicine | Admitting: Family Medicine

## 2014-09-30 ENCOUNTER — Other Ambulatory Visit (HOSPITAL_BASED_OUTPATIENT_CLINIC_OR_DEPARTMENT_OTHER): Payer: Self-pay | Admitting: Family Medicine

## 2014-09-30 DIAGNOSIS — M546 Pain in thoracic spine: Principal | ICD-10-CM

## 2014-09-30 DIAGNOSIS — M545 Low back pain: Secondary | ICD-10-CM | POA: Diagnosis present

## 2014-09-30 DIAGNOSIS — M438X4 Other specified deforming dorsopathies, thoracic region: Secondary | ICD-10-CM | POA: Insufficient documentation

## 2014-09-30 DIAGNOSIS — M5134 Other intervertebral disc degeneration, thoracic region: Secondary | ICD-10-CM | POA: Insufficient documentation

## 2014-11-11 ENCOUNTER — Ambulatory Visit (INDEPENDENT_AMBULATORY_CARE_PROVIDER_SITE_OTHER): Payer: Medicare Other | Admitting: Internal Medicine

## 2014-11-11 ENCOUNTER — Encounter: Payer: Self-pay | Admitting: Internal Medicine

## 2014-11-11 VITALS — BP 130/70 | HR 91 | Ht 66.0 in

## 2014-11-11 DIAGNOSIS — I495 Sick sinus syndrome: Secondary | ICD-10-CM

## 2014-11-11 DIAGNOSIS — I4821 Permanent atrial fibrillation: Secondary | ICD-10-CM

## 2014-11-11 DIAGNOSIS — I482 Chronic atrial fibrillation: Secondary | ICD-10-CM

## 2014-11-11 DIAGNOSIS — Z45018 Encounter for adjustment and management of other part of cardiac pacemaker: Secondary | ICD-10-CM

## 2014-11-11 LAB — MDC_IDC_ENUM_SESS_TYPE_INCLINIC
Battery Remaining Longevity: 110 mo
Brady Statistic RV Percent Paced: 1 %
Lead Channel Impedance Value: 593 Ohm
Lead Channel Sensing Intrinsic Amplitude: 22.4 mV
Lead Channel Setting Pacing Amplitude: 2 V
Lead Channel Setting Pacing Pulse Width: 0.4 ms
Lead Channel Setting Sensing Sensitivity: 5.6 mV
MDC IDC MSMT BATTERY IMPEDANCE: 325 Ohm
MDC IDC MSMT BATTERY VOLTAGE: 2.78 V
MDC IDC MSMT LEADCHNL RA IMPEDANCE VALUE: 0 Ohm
MDC IDC MSMT LEADCHNL RV PACING THRESHOLD AMPLITUDE: 0.5 V
MDC IDC MSMT LEADCHNL RV PACING THRESHOLD PULSEWIDTH: 0.4 ms
MDC IDC SESS DTM: 20160229143853

## 2014-11-11 NOTE — Progress Notes (Signed)
Electrophysiology Office Note   Date:  11/11/2014   ID:  LATORIE Bryan, DOB December 21, 1919, MRN [id number]  PCP:  Abigail Miyamoto, MD  Cardiologist:   Primary Electrophysiologist:  Virl Axe, MD    No chief complaint on file.    History of Present Illness: [name] is a 81 y.o. female is Seen in followup for a pacemaker changed out in January 2013 initially implanted 2004 for tachybradycardia syndrome.  She initially had paroxysmal atrial fibrillation treated with sotalol which apparently by history is permanent. She does take warfarin.  She also has a history of hypertension.  Dyspnea is much improved as well as is the edema following the introduction of low-dose diuretics; last blood work was checked 10/15 she is followed by [name].   The patient denies chest pain, shortness of breath, nocturnal dyspnea, orthopnea or peripheral edema. There have been no palpitations, lightheadedness or syncope.       Past Medical History  Diagnosis Date  . Arthritis   . Chronic atrial fibrillation       chronic coumadin anticoagulation  . Hypertension   . Esophageal reflux     with paraesophageal hernia  . Diverticulosis   . Spinal stenosis     with chronic LBP  . Anemia   . Vitamin D deficiency   . Osteoporosis   . UTI (lower urinary tract infection)   . Tachy-brady syndrome   . Varicose veins   . Fatigue   . History of RIND (reversible ischemic neurological deficit)   . Hypothyroidism   . Complication of anesthesia     "very sensitive to it; hard for me to wakeup"  . Breast cancer     right  . Kidney stones   . Angina   . Pacemaker -Medtronic     DOI 2003/ Change out 2013  . UTI (lower urinary tract infection)    Past Surgical History  Procedure Laterality Date  . Eye surgery      cataract bilateral  . Breast lumpectomy      right  . Tonsillectomy      "as a child"  . Appendectomy  1939  . Oophorectomy  1951    right; "had tumor there"  .  Insert / replace / remove pacemaker Left     Dr Caryl Comes is cardiologist  . Pacemaker generator change N/A 11/01/2011    Procedure: PACEMAKER GENERATOR CHANGE;  Surgeon: Deboraha Sprang, MD;  Location: [facility] CATH LAB;  Service: Cardiovascular;  Laterality: N/A;     Current Outpatient Prescriptions  Medication Sig Dispense Refill  . albuterol (PROVENTIL HFA;VENTOLIN HFA) 108 (90 BASE) MCG/ACT inhaler Inhale 1 puff into the lungs every 6 (six) hours as needed for wheezing or shortness of breath. 1 Inhaler 0  . albuterol (PROVENTIL HFA;VENTOLIN HFA) 108 (90 BASE) MCG/ACT inhaler Inhale 1 puff into the lungs every 6 (six) hours as needed for wheezing or shortness of breath.    . cholecalciferol (VITAMIN D) 1000 UNITS tablet Take 1,000 Units by mouth daily.    . diclofenac sodium (VOLTAREN) 1 % GEL Place 1 application onto the skin 2 (two) times daily as needed. For knee pain    . diltiazem (CARDIZEM CD) 240 MG 24 hr capsule Take 240 mg by mouth daily.      . famotidine (PEPCID) 10 MG tablet Take 10 mg by mouth daily as needed for heartburn.    . fluticasone (VERAMYST) 27.5 MCG/SPRAY nasal spray Place 2 sprays into the  nose daily.    . furosemide (LASIX) 20 MG tablet Take 1 tablet (20 mg total) by mouth 2 (two) times daily. (Patient taking differently: Take 20 mg by mouth daily. ) 6 tablet 0  . levothyroxine (SYNTHROID, LEVOTHROID) 75 MCG tablet Take 37.5 mcg by mouth daily.    Marland Kitchen loratadine (CLARITIN) 10 MG tablet Take 10 mg by mouth daily.    . Menthol-Methyl Salicylate (MUSCLE RUB) 10-15 % CREA Apply 1 application topically 3 (three) times daily as needed for muscle pain.    . Methylfol-Algae-B12-Acetylcyst (CEREFOLIN NAC) 6-90.314-2-600 MG TABS Take 1 tablet by mouth daily.    . Multiple Vitamins-Minerals (ICAPS AREDS FORMULA PO) Take 1 capsule by mouth daily.    Marland Kitchen omeprazole (PRILOSEC) 20 MG capsule Take 20 mg by mouth daily.    . ranitidine (ZANTAC) 150 MG tablet Take 150 mg by mouth 2 (two) times  daily.    . sertraline (ZOLOFT) 25 MG tablet Take 12.5 mg by mouth daily.    . trandolapril (MAVIK) 1 MG tablet Take 1 mg by mouth daily.    Marland Kitchen warfarin (COUMADIN) 3 MG tablet Take 3 mg by mouth See admin instructions.    . [DISCONTINUED] atenolol (TENORMIN) 25 MG tablet Take 1 tablet (25 mg total) by mouth daily. 30 tablet 0  . [DISCONTINUED] ferrous sulfate 325 (65 FE) MG tablet Take 1 tablet (325 mg total) by mouth 2 (two) times daily with a meal. 60 tablet 0   No current facility-administered medications for this visit.    Allergies:   Ceftin; Amiodarone; Beet; Benzyl alc-guai-pse; Caffeine; Chlordiazepoxide-clidinium; Chocolate; Codeine phosphate; Darvon; Dilaudid; Dimetapp cold-allergy; Dust mite extract; Estrogens; Fungizone; Iodine; Micardis; Milk; Mold extract; Motrin; Nitroglycerin; Penicillins; Pentazocine lactate; Petroleum jelly; Pineapple; Plavix; Prednisone; Soap; Sulfamethoxazole; Tagamet; Tape; Watermelon concentrate; Zithromax; Dyazide; Erythromycin; and Talwin   Social History:  The patient  reports that she has never smoked. She has never used smokeless tobacco. She reports that she does not drink alcohol or use illicit drugs.   Family History:  The patient's family history includes Cancer in her sister.    ROS:  Please see the history of present illness and past medical history  Otherwise, review of systems is negative  pos.     PHYSICAL EXAM: VS:  BP 130/70 mmHg  Pulse 91  Ht 5\' 6"  (1.676 m)  Wt  , BMI Body mass index is 0.00 kg/(m^2). GEN: Well nourished, well developed, in no acute distress HEENT: normal Neck:  JVD flat , carotid bruits, or masses Cardiac: IRREGULAR RATE and RHYTHM ; 2/6  murmurs, rubs, No S4  Back without kyphosis; No CVAT Respiratory:  clear to auscultation bilaterally, normal work of breathing GI: soft, nontender, nondistended, + BS MS: no deformity or atrophy Extremities no clubbing cyanosis +  edema Skin: warm and dry,  device pocket is  well healed without teathering Neuro:  Strength and sensation are intact Psych: euthymic mood, full affect  EKG:  EKG is ordered today. The ekg ordered today shows afib 91  -/86/44  Rare PVC septal MI  Device interrogation is reviewed today in detail.  See PaceArt for details.   Recent Labs: 02/08/2014: ALT 11; Platelets 219; Pro B Natriuretic peptide (BNP) 2218.0* 06/19/2014: BUN 17; Creatinine 1.30*; Hemoglobin 11.2*; Potassium 4.4; Sodium 133*    Lipid Panel     Component Value Date/Time   CHOL * 11/27/2010 0600    241        ATP III CLASSIFICATION:  <200  mg/dL   Desirable  200-239  mg/dL   Borderline High  >=240    mg/dL   High          TRIG 118 11/27/2010 0600   HDL 47 11/27/2010 0600   CHOLHDL 5.1 11/27/2010 0600   VLDL 24 11/27/2010 0600   LDLCALC * 11/27/2010 0600    170        Total Cholesterol/HDL:CHD Risk Coronary Heart Disease Risk Table                     Men   Women  1/2 Average Risk   3.4   3.3  Average Risk       5.0   4.4  2 X Average Risk   9.6   7.1  3 X Average Risk  23.4   11.0        Use the calculated Patient Ratio above and the CHD Risk Table to determine the patient's CHD Risk.        ATP III CLASSIFICATION (LDL):  <100     mg/dL   Optimal  100-129  mg/dL   Near or Above                    Optimal  130-159  mg/dL   Borderline  160-189  mg/dL   High  >190     mg/dL   Very High     Wt Readings from Last 3 Encounters:  08/15/14 170 lb (77.111 kg)  06/19/14 170 lb (77.111 kg)  10/23/13 165 lb (74.844 kg)      Other studies Reviewed: Additional studies/ records that were reviewed today include: none    ASSESSMENT AND PLAN:  Atrial fibrillation-permanent  Bradycardia stable post pacing  Pacemaker-Medtronic The patient's device was interrogated.  The information was reviewed. No changes were made in the programming.    HFpEF  Euvolemic continue current meds  We discussed the use of the NOACs compared to Coumadin. We  briefly reviewed the data of at least comparability in stroke prevention, bleeding and outcome. We discussed some of the new once wherein somewhat associated with decreased ischemic stroke risk, one to be taken daily, and has been shown to be comparable and bleeding risk to aspirin.  We also discussed bleeding associated with warfarin as well as NOACs and a wall bleeding as a complication of all these drugs intracranial bleeding is more frequently associated with warfarin then the NOACs and a GI bleeding is more commonly associated with the latter   I have asked her to review this with Dr. freed   Current medicines are reviewed at length with the patient today.   The patient has concerns regarding her medicines.  The following changes were made today:As above   Labs/ tests ordered today include:    No orders of the defined types were placed in this encounter.     Disposition:   FU with me   1 year(s)  Signed, Virl Axe, MD  11/11/2014 1:40 PM     [address] [place] [address] [phone] (office) [phone] (fax)

## 2014-11-11 NOTE — Patient Instructions (Signed)

## 2014-12-17 ENCOUNTER — Encounter (HOSPITAL_COMMUNITY): Payer: Self-pay | Admitting: Family Medicine

## 2014-12-17 ENCOUNTER — Inpatient Hospital Stay (HOSPITAL_COMMUNITY)
Admission: EM | Admit: 2014-12-17 | Discharge: 2014-12-23 | DRG: 871 | Disposition: A | Discharge status: Skilled Nursing Facility | Payer: Medicare Other | Attending: Internal Medicine | Admitting: Internal Medicine

## 2014-12-17 ENCOUNTER — Emergency Department (HOSPITAL_COMMUNITY): Payer: Medicare Other

## 2014-12-17 DIAGNOSIS — E1165 Type 2 diabetes mellitus with hyperglycemia: Secondary | ICD-10-CM | POA: Diagnosis present

## 2014-12-17 DIAGNOSIS — J9601 Acute respiratory failure with hypoxia: Secondary | ICD-10-CM | POA: Diagnosis present

## 2014-12-17 DIAGNOSIS — A419 Sepsis, unspecified organism: Secondary | ICD-10-CM | POA: Diagnosis present

## 2014-12-17 DIAGNOSIS — J9600 Acute respiratory failure, unspecified whether with hypoxia or hypercapnia: Secondary | ICD-10-CM

## 2014-12-17 DIAGNOSIS — N390 Urinary tract infection, site not specified: Secondary | ICD-10-CM | POA: Diagnosis present

## 2014-12-17 DIAGNOSIS — I10 Essential (primary) hypertension: Secondary | ICD-10-CM | POA: Diagnosis present

## 2014-12-17 DIAGNOSIS — I5033 Acute on chronic diastolic (congestive) heart failure: Secondary | ICD-10-CM | POA: Diagnosis present

## 2014-12-17 DIAGNOSIS — I482 Chronic atrial fibrillation, unspecified: Secondary | ICD-10-CM

## 2014-12-17 DIAGNOSIS — Z66 Do not resuscitate: Secondary | ICD-10-CM | POA: Diagnosis present

## 2014-12-17 DIAGNOSIS — I4891 Unspecified atrial fibrillation: Secondary | ICD-10-CM | POA: Diagnosis present

## 2014-12-17 DIAGNOSIS — I129 Hypertensive chronic kidney disease with stage 1 through stage 4 chronic kidney disease, or unspecified chronic kidney disease: Secondary | ICD-10-CM | POA: Diagnosis present

## 2014-12-17 DIAGNOSIS — N183 Chronic kidney disease, stage 3 unspecified: Secondary | ICD-10-CM | POA: Diagnosis present

## 2014-12-17 DIAGNOSIS — Y95 Nosocomial condition: Secondary | ICD-10-CM | POA: Diagnosis present

## 2014-12-17 DIAGNOSIS — R0602 Shortness of breath: Secondary | ICD-10-CM | POA: Diagnosis not present

## 2014-12-17 DIAGNOSIS — M1990 Unspecified osteoarthritis, unspecified site: Secondary | ICD-10-CM | POA: Diagnosis present

## 2014-12-17 DIAGNOSIS — Z7901 Long term (current) use of anticoagulants: Secondary | ICD-10-CM | POA: Diagnosis not present

## 2014-12-17 DIAGNOSIS — J690 Pneumonitis due to inhalation of food and vomit: Secondary | ICD-10-CM | POA: Diagnosis present

## 2014-12-17 DIAGNOSIS — E039 Hypothyroidism, unspecified: Secondary | ICD-10-CM | POA: Diagnosis present

## 2014-12-17 DIAGNOSIS — Z853 Personal history of malignant neoplasm of breast: Secondary | ICD-10-CM | POA: Diagnosis not present

## 2014-12-17 DIAGNOSIS — K219 Gastro-esophageal reflux disease without esophagitis: Secondary | ICD-10-CM | POA: Diagnosis present

## 2014-12-17 DIAGNOSIS — A4151 Sepsis due to Escherichia coli [E. coli]: Principal | ICD-10-CM | POA: Diagnosis present

## 2014-12-17 DIAGNOSIS — D509 Iron deficiency anemia, unspecified: Secondary | ICD-10-CM | POA: Diagnosis present

## 2014-12-17 DIAGNOSIS — R112 Nausea with vomiting, unspecified: Secondary | ICD-10-CM | POA: Diagnosis present

## 2014-12-17 DIAGNOSIS — R739 Hyperglycemia, unspecified: Secondary | ICD-10-CM | POA: Diagnosis present

## 2014-12-17 DIAGNOSIS — I5032 Chronic diastolic (congestive) heart failure: Secondary | ICD-10-CM | POA: Diagnosis not present

## 2014-12-17 DIAGNOSIS — J189 Pneumonia, unspecified organism: Secondary | ICD-10-CM

## 2014-12-17 DIAGNOSIS — R509 Fever, unspecified: Secondary | ICD-10-CM

## 2014-12-17 DIAGNOSIS — R531 Weakness: Secondary | ICD-10-CM

## 2014-12-17 DIAGNOSIS — E119 Type 2 diabetes mellitus without complications: Secondary | ICD-10-CM

## 2014-12-17 DIAGNOSIS — R7881 Bacteremia: Secondary | ICD-10-CM | POA: Diagnosis present

## 2014-12-17 DIAGNOSIS — K5900 Constipation, unspecified: Secondary | ICD-10-CM | POA: Diagnosis present

## 2014-12-17 HISTORY — DX: Chronic kidney disease, stage 3 (moderate): N18.3

## 2014-12-17 HISTORY — DX: Chronic kidney disease, stage 3 unspecified: N18.30

## 2014-12-17 LAB — CBC WITH DIFFERENTIAL/PLATELET
Basophils Absolute: 0 10*3/uL (ref 0.0–0.1)
Basophils Relative: 0 % (ref 0–1)
Eosinophils Absolute: 0 10*3/uL (ref 0.0–0.7)
Eosinophils Relative: 0 % (ref 0–5)
HCT: 30.6 % — ABNORMAL LOW (ref 36.0–46.0)
Hemoglobin: 8.6 g/dL — ABNORMAL LOW (ref 12.0–15.0)
Lymphocytes Relative: 8 % — ABNORMAL LOW (ref 12–46)
Lymphs Abs: 0.9 10*3/uL (ref 0.7–4.0)
MCH: 22.5 pg — AB (ref 26.0–34.0)
MCHC: 28.1 g/dL — ABNORMAL LOW (ref 30.0–36.0)
MCV: 79.9 fL (ref 78.0–100.0)
Monocytes Absolute: 1.2 10*3/uL — ABNORMAL HIGH (ref 0.1–1.0)
Monocytes Relative: 11 % (ref 3–12)
Neutro Abs: 8.8 10*3/uL — ABNORMAL HIGH (ref 1.7–7.7)
Neutrophils Relative %: 81 % — ABNORMAL HIGH (ref 43–77)
Platelets: 221 10*3/uL (ref 150–400)
RBC: 3.83 MIL/uL — ABNORMAL LOW (ref 3.87–5.11)
RDW: 18.5 % — ABNORMAL HIGH (ref 11.5–15.5)
WBC: 10.9 10*3/uL — ABNORMAL HIGH (ref 4.0–10.5)

## 2014-12-17 LAB — URINALYSIS, ROUTINE W REFLEX MICROSCOPIC
Bilirubin Urine: NEGATIVE
GLUCOSE, UA: NEGATIVE mg/dL
Ketones, ur: NEGATIVE mg/dL
Nitrite: POSITIVE — AB
Protein, ur: 30 mg/dL — AB
SPECIFIC GRAVITY, URINE: 1.013 (ref 1.005–1.030)
Urobilinogen, UA: 0.2 mg/dL (ref 0.0–1.0)
pH: 7.5 (ref 5.0–8.0)

## 2014-12-17 LAB — COMPREHENSIVE METABOLIC PANEL
ALBUMIN: 3.8 g/dL (ref 3.5–5.2)
ALK PHOS: 97 U/L (ref 39–117)
ALT: 13 U/L (ref 0–35)
AST: 23 U/L (ref 0–37)
Anion gap: 12 (ref 5–15)
BILIRUBIN TOTAL: 0.6 mg/dL (ref 0.3–1.2)
BUN: 14 mg/dL (ref 6–23)
CHLORIDE: 98 mmol/L (ref 96–112)
CO2: 28 mmol/L (ref 19–32)
CREATININE: 1.08 mg/dL (ref 0.50–1.10)
Calcium: 8.5 mg/dL (ref 8.4–10.5)
GFR calc Af Amer: 50 mL/min — ABNORMAL LOW (ref >90)
GFR calc non Af Amer: 43 mL/min — ABNORMAL LOW (ref >90)
Glucose, Bld: 211 mg/dL — ABNORMAL HIGH (ref 70–99)
POTASSIUM: 3.8 mmol/L (ref 3.5–5.1)
Sodium: 138 mmol/L (ref 135–145)
Total Protein: 6.6 g/dL (ref 6.0–8.3)

## 2014-12-17 LAB — I-STAT CG4 LACTIC ACID, ED
LACTIC ACID, VENOUS: 2.88 mmol/L — AB (ref 0.5–2.0)
Lactic Acid, Venous: 2.88 mmol/L (ref 0.5–2.0)

## 2014-12-17 LAB — URINE MICROSCOPIC-ADD ON

## 2014-12-17 LAB — BLOOD GAS, ARTERIAL
ACID-BASE EXCESS: 2.4 mmol/L — AB (ref 0.0–2.0)
BICARBONATE: 26.3 meq/L — AB (ref 20.0–24.0)
Drawn by: 308601
FIO2: 1 %
O2 SAT: 96.4 %
PCO2 ART: 43.1 mmHg (ref 35.0–45.0)
PO2 ART: 104 mmHg — AB (ref 80.0–100.0)
Patient temperature: 101.3
TCO2: 24.7 mmol/L (ref 0–100)
pH, Arterial: 7.41 (ref 7.350–7.450)

## 2014-12-17 LAB — I-STAT TROPONIN, ED: TROPONIN I, POC: 0.01 ng/mL (ref 0.00–0.08)

## 2014-12-17 LAB — MRSA PCR SCREENING: MRSA BY PCR: NEGATIVE

## 2014-12-17 LAB — PROTIME-INR
INR: 2.32 — ABNORMAL HIGH (ref 0.00–1.49)
PROTHROMBIN TIME: 25.7 s — AB (ref 11.6–15.2)

## 2014-12-17 LAB — BRAIN NATRIURETIC PEPTIDE: B NATRIURETIC PEPTIDE 5: 328.8 pg/mL — AB (ref 0.0–100.0)

## 2014-12-17 MED ORDER — ONDANSETRON HCL 4 MG PO TABS: 4.0000 mg | ORAL_TABLET | Freq: Four times a day (QID) | ORAL | Status: DC | PRN

## 2014-12-17 MED ORDER — SODIUM CHLORIDE 0.9 % IV SOLN
INTRAVENOUS | Status: DC
  Administered 2014-12-17 – 2014-12-18 (×2): via INTRAVENOUS

## 2014-12-17 MED ORDER — WARFARIN SODIUM 3 MG PO TABS
3.0000 mg | ORAL_TABLET | Freq: Once | ORAL | Status: AC
  Administered 2014-12-17: 3 mg via ORAL
  Filled 2014-12-17 (×2): qty 1

## 2014-12-17 MED ORDER — ALBUTEROL SULFATE (2.5 MG/3ML) 0.083% IN NEBU
2.5000 mg | INHALATION_SOLUTION | Freq: Four times a day (QID) | RESPIRATORY_TRACT | Status: DC
  Administered 2014-12-17: 2.5 mg via RESPIRATORY_TRACT
  Filled 2014-12-17: qty 3

## 2014-12-17 MED ORDER — DEXTROSE 5 % IV SOLN
2.0000 g | Freq: Three times a day (TID) | INTRAVENOUS | Status: DC
  Administered 2014-12-17 – 2014-12-18 (×3): 2 g via INTRAVENOUS
  Filled 2014-12-17 (×4): qty 2

## 2014-12-17 MED ORDER — FLUTICASONE PROPIONATE 50 MCG/ACT NA SUSP
2.0000 | Freq: Every day | NASAL | Status: DC
  Filled 2014-12-17: qty 16

## 2014-12-17 MED ORDER — IPRATROPIUM-ALBUTEROL 0.5-2.5 (3) MG/3ML IN SOLN
3.0000 mL | Freq: Four times a day (QID) | RESPIRATORY_TRACT | Status: DC
  Administered 2014-12-18 – 2014-12-19 (×5): 3 mL via RESPIRATORY_TRACT
  Filled 2014-12-17 (×5): qty 3

## 2014-12-17 MED ORDER — METHYLPREDNISOLONE SODIUM SUCC 125 MG IJ SOLR
125.0000 mg | Freq: Once | INTRAMUSCULAR | Status: AC
  Administered 2014-12-17: 125 mg via INTRAVENOUS
  Filled 2014-12-17: qty 2

## 2014-12-17 MED ORDER — ACETAMINOPHEN 650 MG RE SUPP
650.0000 mg | Freq: Once | RECTAL | Status: AC
  Administered 2014-12-17: 650 mg via RECTAL
  Filled 2014-12-17: qty 1

## 2014-12-17 MED ORDER — DEXTROSE 5 % IV SOLN
2.0000 g | Freq: Once | INTRAVENOUS | Status: AC
  Administered 2014-12-17: 2 g via INTRAVENOUS
  Filled 2014-12-17: qty 2

## 2014-12-17 MED ORDER — FLUTICASONE FUROATE 27.5 MCG/SPRAY NA SUSP: 2.0000 | Freq: Every day | NASAL | Status: DC

## 2014-12-17 MED ORDER — SODIUM CHLORIDE 0.9 % IV BOLUS (SEPSIS)
1000.0000 mL | Freq: Once | INTRAVENOUS | Status: AC
  Administered 2014-12-17: 1000 mL via INTRAVENOUS

## 2014-12-17 MED ORDER — ALBUTEROL SULFATE (2.5 MG/3ML) 0.083% IN NEBU
2.5000 mg | INHALATION_SOLUTION | RESPIRATORY_TRACT | Status: DC | PRN
  Administered 2014-12-18 – 2014-12-19 (×2): 2.5 mg via RESPIRATORY_TRACT
  Filled 2014-12-17 (×2): qty 3

## 2014-12-17 MED ORDER — VANCOMYCIN HCL IN DEXTROSE 1-5 GM/200ML-% IV SOLN
1000.0000 mg | INTRAVENOUS | Status: DC
  Administered 2014-12-18: 1000 mg via INTRAVENOUS
  Filled 2014-12-17: qty 200

## 2014-12-17 MED ORDER — METRONIDAZOLE IN NACL 5-0.79 MG/ML-% IV SOLN
500.0000 mg | Freq: Three times a day (TID) | INTRAVENOUS | Status: DC
  Administered 2014-12-17 – 2014-12-18 (×3): 500 mg via INTRAVENOUS
  Filled 2014-12-17 (×3): qty 100

## 2014-12-17 MED ORDER — ALBUTEROL SULFATE (2.5 MG/3ML) 0.083% IN NEBU
5.0000 mg | INHALATION_SOLUTION | Freq: Once | RESPIRATORY_TRACT | Status: AC
  Administered 2014-12-17: 5 mg via RESPIRATORY_TRACT
  Filled 2014-12-17: qty 6

## 2014-12-17 MED ORDER — IPRATROPIUM BROMIDE 0.02 % IN SOLN
0.5000 mg | Freq: Four times a day (QID) | RESPIRATORY_TRACT | Status: DC
  Administered 2014-12-17: 0.5 mg via RESPIRATORY_TRACT
  Filled 2014-12-17: qty 2.5

## 2014-12-17 MED ORDER — SODIUM CHLORIDE 0.9 % IJ SOLN
3.0000 mL | Freq: Two times a day (BID) | INTRAMUSCULAR | Status: DC
Start: 2014-12-17 — End: 2014-12-24
  Administered 2014-12-18 – 2014-12-23 (×10): 3 mL via INTRAVENOUS

## 2014-12-17 MED ORDER — SODIUM CHLORIDE 0.9 % IV BOLUS (SEPSIS)
500.0000 mL | Freq: Once | INTRAVENOUS | Status: AC
  Administered 2014-12-17: 500 mL via INTRAVENOUS

## 2014-12-17 MED ORDER — WARFARIN - PHARMACIST DOSING INPATIENT: Freq: Every day | Status: DC

## 2014-12-17 MED ORDER — ONDANSETRON HCL 4 MG/2ML IJ SOLN
4.0000 mg | Freq: Four times a day (QID) | INTRAMUSCULAR | Status: DC | PRN
  Administered 2014-12-19: 4 mg via INTRAVENOUS
  Filled 2014-12-17: qty 2

## 2014-12-17 MED ORDER — VANCOMYCIN HCL IN DEXTROSE 1-5 GM/200ML-% IV SOLN
1000.0000 mg | Freq: Once | INTRAVENOUS | Status: AC
  Administered 2014-12-17: 1000 mg via INTRAVENOUS
  Filled 2014-12-17: qty 200

## 2014-12-17 NOTE — ED Notes (Signed)
Pt presents with her caregiver who reports the patient was struggling to breathe and started vomiting. She tried to roll her on her side and give her her albuterol inhaler without improvement.   Pt is awake, looks scared. Scattered wheezing, not audible, no retractions. Pt is pale   Medical screening examination/treatment/procedure(s) were conducted as a shared visit with non-physician practitioner(s) and myself.  I personally evaluated the patient during the encounter.   EKG Interpretation None       Rolland Porter, MD, Barbette Or, MD 12/17/14 [phone]

## 2014-12-17 NOTE — ED Notes (Signed)
RT at bedside.

## 2014-12-17 NOTE — Progress Notes (Signed)
ED CM noted CM Consult for Patient using the services of [facility]-has cna 24/7 Patient also from [facility] SW consult entered in [facility] for return to snf

## 2014-12-17 NOTE — ED Notes (Signed)
Provider made aware of abnormal lactic. 

## 2014-12-17 NOTE — H&P (Signed)
Patient Demographics  [name], is a 81 y.o. female  MRN: [id number]   DOB - [birth year]  Admit Date - 12/17/2014  Outpatient Primary MD for the patient is FRIED, Jaymes Graff, MD   With History of -  Past Medical History  Diagnosis Date  . Arthritis   . Chronic atrial fibrillation       chronic coumadin anticoagulation  . Hypertension   . Esophageal reflux     with paraesophageal hernia  . Diverticulosis   . Spinal stenosis     with chronic LBP  . Anemia   . Vitamin D deficiency   . Osteoporosis   . UTI (lower urinary tract infection)   . Tachy-brady syndrome   . Varicose veins   . Fatigue   . History of RIND (reversible ischemic neurological deficit)   . Hypothyroidism   . Complication of anesthesia     "very sensitive to it; hard for me to wakeup"  . Breast cancer     right  . Kidney stones   . Angina   . Pacemaker -Medtronic     DOI 2003/ Change out 2013  . UTI (lower urinary tract infection)       Past Surgical History  Procedure Laterality Date  . Eye surgery      cataract bilateral  . Breast lumpectomy      right  . Tonsillectomy      "as a child"  . Appendectomy  1939  . Oophorectomy  1951    right; "had tumor there"  . Insert / replace / remove pacemaker Left     Dr Caryl Comes is cardiologist  . Pacemaker generator change N/A 11/01/2011    Procedure: PACEMAKER GENERATOR CHANGE;  Surgeon: Deboraha Sprang, MD;  Location: [facility] CATH [facility];  Service: Cardiovascular;  Laterality: N/A;    in for   Chief Complaint  Patient presents with  . Respiratory Distress     HPI  [name]  is a 81 y.o. female, with past medical history of hypertension, arthritis, atrial fibrillation on warfarin, anemia, hypothyroidism, recurrent UTIs, patient at baseline with minimal dementia, ambulates with a walker, patient lives at skilled nursing facility, brought secondary to respiratory distress, a shunt caregiver at bedside, reports she woke up at night with  patient in respiratory distress, as well noticed some vomiting, so they called ED, NAD patient was hypoxic at 78% on room air, requiring nonrebreather bag, no fever, urinary symptoms, altered mental status, diarrhea, prior to this episode, patient is slightly lethargic, history obtained from caregiver at bedside, chest x-ray showing worsening right lung infiltration.    Review of Systems    Patient is lethargic, and confused, can give a reliable review of system.  A full 10 point Review of Systems was done, except as stated above, all other Review of Systems were negative.   Social History History  Substance Use Topics  . Smoking status: Never Smoker   . Smokeless tobacco: Never Used  . Alcohol Use: No     Family History Family History  Problem Relation Age of Onset  . Cancer Sister      Prior to Admission medications   Medication Sig Start Date End Date Taking? Authorizing Provider  albuterol (PROVENTIL HFA;VENTOLIN HFA) 108 (90 BASE) MCG/ACT inhaler Inhale 1 puff into the lungs every 6 (six) hours as needed for wheezing or shortness of breath. 11/02/11 12/17/14 Yes Belkys A Regalado, MD  cholecalciferol (VITAMIN D) 1000 UNITS tablet Take 1,000  Units by mouth daily.   Yes Historical Provider, MD  diltiazem (CARDIZEM CD) 180 MG 24 hr capsule Take 1 capsule by mouth daily. 11/29/14  Yes Historical Provider, MD  famotidine (PEPCID) 10 MG tablet Take 10 mg by mouth daily as needed for heartburn.   Yes Historical Provider, MD  furosemide (LASIX) 20 MG tablet Take 1 tablet (20 mg total) by mouth 2 (two) times daily. Patient taking differently: Take 20 mg by mouth daily.  02/08/14  Yes Carmin Muskrat, MD  levothyroxine (SYNTHROID, LEVOTHROID) 75 MCG tablet Take 37.5 mcg by mouth daily.   Yes Historical Provider, MD  loratadine (CLARITIN) 10 MG tablet Take 10 mg by mouth daily.   Yes Historical Provider, MD  Menthol-Methyl Salicylate (MUSCLE RUB) 10-15 % CREA Apply 1 application topically 3  (three) times daily as needed for muscle pain.   Yes Historical Provider, MD  Methylfol-Algae-B12-Acetylcyst (CEREFOLIN NAC) 6-90.314-2-600 MG TABS Take 1 tablet by mouth daily.   Yes Historical Provider, MD  Multiple Vitamins-Minerals (ICAPS AREDS FORMULA PO) Take 1 capsule by mouth daily.   Yes Historical Provider, MD  omeprazole (PRILOSEC) 20 MG capsule Take 20 mg by mouth daily.   Yes Historical Provider, MD  Oxycodone HCl 10 MG TABS Take 10 mg by mouth every 8 (eight) hours as needed (pain).  11/19/14  Yes Historical Provider, MD  ranitidine (ZANTAC) 150 MG tablet Take 150 mg by mouth 2 (two) times daily.   Yes Historical Provider, MD  sertraline (ZOLOFT) 25 MG tablet Take 12.5 mg by mouth daily.   Yes Historical Provider, MD  trandolapril (MAVIK) 1 MG tablet Take 1 mg by mouth daily.   Yes Historical Provider, MD  warfarin (COUMADIN) 3 MG tablet Take 3 mg by mouth See admin instructions. 11/27/11  Yes Rogelia Mire, NP    Allergies  Allergen Reactions  . Ceftin [Cefuroxime Axetil] Shortness Of Breath and Itching  . Amiodarone Other (See Comments)    dizziness  . Beet Other (See Comments)    unknown  . Benzyl Alc-Guai-Pse Other (See Comments)    unknown  . Caffeine Other (See Comments)    unknown  . Chlordiazepoxide-Clidinium Other (See Comments)    unknown  . Chocolate Other (See Comments)    unknown  . Codeine Phosphate Other (See Comments)    unknown  . Darvon Other (See Comments)    unknown  . Dilaudid [Hydromorphone Hcl] Other (See Comments)    unknown  . Dimetapp Cold-Allergy Other (See Comments)    unknown  . Dust Mite Extract Other (See Comments)    unknown  . Estrogens Other (See Comments)    unknown  . Fungizone [Amphotericin B] Other (See Comments)    unknown  . Iodine Other (See Comments)    unknown  . Micardis [Telmisartan] Other (See Comments)    Fatigue and back pain  . Milk [Dairy] Other (See Comments)    unknown  . Mold Extract [Trichophyton  Mentagrophyte] Other (See Comments)    unknown  . Motrin [Ibuprofen] Other (See Comments)    unknown  . Nitroglycerin Other (See Comments)    Head ache   . Penicillins Other (See Comments)    unknown  . Pentazocine Lactate Other (See Comments)    unknown  . Petroleum Jelly [Petrolatum] Other (See Comments)    unknown  . Pineapple Other (See Comments)    unknown  . Plavix [Clopidogrel Bisulfate] Other (See Comments)    dizziness  . Prednisone Other (See Comments)  unknown  . Soap Other (See Comments)    unknown  . Sulfamethoxazole Other (See Comments)    unknown  . Tagamet [Cimetidine] Other (See Comments)    unknown  . Tape Other (See Comments)    unknown  . Watermelon Concentrate Other (See Comments)    unknown  . Zithromax [Azithromycin Dihydrate] Other (See Comments)    unknown  . Dyazide [Hydrochlorothiazide W-Triamterene] Rash  . Erythromycin Other (See Comments)    unknown  . Talwin [Pentazocine] Rash    Physical Exam  Vitals  Blood pressure 116/52, pulse 90, temperature 101.1 F (38.4 C), temperature source Rectal, resp. rate 18, weight 77.111 kg (170 lb), SpO2 99 %.   1. General frail, ill-appearing elderly female lying in bed in mild respiratory distress.  2. Arousable, communicative, but confused.  3. No F.N deficits, ALL C.Nerves Intact, appears to be moving all extremities without significant deficits.  4. Ears and Eyes appear Normal, Conjunctivae clear, PERRLA. Dry Oral Mucosa.  5. Supple Neck, No JVD, No cervical lymphadenopathy appriciated, No Carotid Bruits.  6. Symmetrical Chest wall movement, mild use of accessory muscle, no wheezing,.  7. RRR, No Gallops, Rubs or Murmurs, No Parasternal Heave.  8. Positive Bowel Sounds, Abdomen Soft, No tenderness, No organomegaly appriciated,No rebound -guarding or rigidity.  9.  No Cyanosis, Normal Skin Turgor, No Skin Rash or Bruise.  10. Good muscle tone,  joints appear normal , no  effusions,  11. No Palpable Lymph Nodes in Neck or Axillae    Data Review  CBC  Recent Labs Lab 12/17/14 0656  WBC 10.9*  HGB 8.6*  HCT 30.6*  PLT 221  MCV 79.9  MCH 22.5*  MCHC 28.1*  RDW 18.5*  LYMPHSABS 0.9  MONOABS 1.2*  EOSABS 0.0  BASOSABS 0.0   ------------------------------------------------------------------------------------------------------------------  Chemistries   Recent Labs Lab 12/17/14 0656  NA 138  K 3.8  CL 98  CO2 28  GLUCOSE 211*  BUN 14  CREATININE 1.08  CALCIUM 8.5  AST 23  ALT 13  ALKPHOS 97  BILITOT 0.6   ------------------------------------------------------------------------------------------------------------------ estimated creatinine clearance is 34.8 mL/min (by C-G formula based on Cr of 1.08). ------------------------------------------------------------------------------------------------------------------ No results for input(s): TSH, T4TOTAL, T3FREE, THYROIDAB in the last 72 hours.  Invalid input(s): FREET3   Coagulation profile  Recent Labs Lab 12/17/14 0656  INR 2.32*   ------------------------------------------------------------------------------------------------------------------- No results for input(s): DDIMER in the last 72 hours. -------------------------------------------------------------------------------------------------------------------  Cardiac Enzymes No results for input(s): CKMB, TROPONINI, MYOGLOBIN in the last 168 hours.  Invalid input(s): CK ------------------------------------------------------------------------------------------------------------------ Invalid input(s): POCBNP   ---------------------------------------------------------------------------------------------------------------  Urinalysis    Component Value Date/Time   COLORURINE YELLOW 12/17/2014 0630   APPEARANCEUR CLOUDY* 12/17/2014 0630   LABSPEC 1.013 12/17/2014 0630   PHURINE 7.5 12/17/2014 0630   GLUCOSEU  NEGATIVE 12/17/2014 0630   HGBUR SMALL* 12/17/2014 0630   BILIRUBINUR NEGATIVE 12/17/2014 0630   KETONESUR NEGATIVE 12/17/2014 0630   PROTEINUR 30* 12/17/2014 0630   UROBILINOGEN 0.2 12/17/2014 0630   NITRITE POSITIVE* 12/17/2014 0630   LEUKOCYTESUR MODERATE* 12/17/2014 0630    ----------------------------------------------------------------------------------------------------------------  Imaging results:   Dg Chest Port 1 View  12/17/2014   CLINICAL DATA:  Shortness of breath.  Sudden onset fever.  EXAM: PORTABLE CHEST - 1 VIEW  COMPARISON:  02/08/2014  FINDINGS: Shallow inspiration with elevation of right hemidiaphragm. Linear atelectasis or fibrosis in the right lung base similar to prior study. No focal consolidation in the lungs. No blunting of costophrenic angles. No pneumothorax. Old right  rib fractures. Cardiac pacemaker. Probable esophageal hiatal hernia behind the heart.  IMPRESSION: Shallow inspiration with elevation of right hemidiaphragm and right lung base atelectasis or infiltration. Changes are stable since prior study.   Electronically Signed   By: Lucienne Capers M.D.   On: 12/17/2014 06:44        Assessment & Plan  Active Problems:   Atrial fibrillation   Diastolic HF (heart failure)   Hypertension   Acute respiratory failure   PNA (pneumonia)   UTI (lower urinary tract infection)   Acute hypoxic respiratory failure - This is in the setting of aspiration pneumonia, currently patient on nonrebreather mask, lethargic, so would hold on BiPAP, ABG showing no CO2 retention. - Admit to stepdown - Swallow evaluation to clear swallowing.  Aspiration pneumonia - Patient has multiple allergies, will start on Achromycin, Azactam, Flagyl. - Will follow on blood cultures.  UTI - Patient is on broad-spectrum antibiotic coverage, follow on urine cultures  Atrial fibrillation - Currently normal sinus rhythm,Chad vasc >2, on warfarin, pharmacy to dose. - Resume  Cardizem when able to swallow, if she becomes tachycardic, will need Cardizem drip.  Hypothyroidism - Resume Synthroid when able to swallow  Diastolic CHF - Appears euvolemic, monitor closely as on IV fluids.  Hypertension - Continue to hold medication until patient is able to swallow  DVT Prophylaxis on warfarin AM Labs Ordered, also please review Full Orders  Family Communication: Admission, patients condition and plan of care including tests being ordered have been discussed with health care power of attorney at bed [place] [phone]  who indicate understanding and agree with the plan and Code Status.  Code Status DNR, HCPOA confirm patient DO NOT RESUSCITATE/DO NOT INTUBATE CODE STATUS.  Likely DC to  admit to stepdown  Condition critical  Time spent in minutes : 65 minutes    ELGERGAWY, DAWOOD M.D on 12/17/2014 at 10:59 AM  Between 7am to 7pm - Pager - [phone]  After 7pm go to [email or web address] - password [email or web address]  And look for the night coverage person covering me after hours  [facility] Office  [phone]   **Disclaimer: This note may have been dictated with voice recognition software. Similar sounding words can inadvertently be transcribed and this note may contain transcription errors which may not have been corrected upon publication of note.**

## 2014-12-17 NOTE — Progress Notes (Signed)
ANTICOAGULATION CONSULT NOTE - Initial Consult  Pharmacy Consult for warfarin Indication: Atrial Fibrilation  Allergies  Allergen Reactions  . Ceftin [Cefuroxime Axetil] Shortness Of Breath and Itching  . Amiodarone Other (See Comments)    dizziness  . Beet Other (See Comments)    unknown  . Benzyl Alc-Guai-Pse Other (See Comments)    unknown  . Caffeine Other (See Comments)    unknown  . Chlordiazepoxide-Clidinium Other (See Comments)    unknown  . Chocolate Other (See Comments)    unknown  . Codeine Phosphate Other (See Comments)    unknown  . Darvon Other (See Comments)    unknown  . Dilaudid [Hydromorphone Hcl] Other (See Comments)    unknown  . Dimetapp Cold-Allergy Other (See Comments)    unknown  . Dust Mite Extract Other (See Comments)    unknown  . Estrogens Other (See Comments)    unknown  . Fungizone [Amphotericin B] Other (See Comments)    unknown  . Iodine Other (See Comments)    unknown  . Micardis [Telmisartan] Other (See Comments)    Fatigue and back pain  . Milk [Dairy] Other (See Comments)    unknown  . Mold Extract [Trichophyton Mentagrophyte] Other (See Comments)    unknown  . Motrin [Ibuprofen] Other (See Comments)    unknown  . Nitroglycerin Other (See Comments)    Head ache   . Penicillins Other (See Comments)    unknown  . Pentazocine Lactate Other (See Comments)    unknown  . Petroleum Jelly [Petrolatum] Other (See Comments)    unknown  . Pineapple Other (See Comments)    unknown  . Plavix [Clopidogrel Bisulfate] Other (See Comments)    dizziness  . Prednisone Other (See Comments)    unknown  . Soap Other (See Comments)    unknown  . Sulfamethoxazole Other (See Comments)    unknown  . Tagamet [Cimetidine] Other (See Comments)    unknown  . Tape Other (See Comments)    unknown  . Watermelon Concentrate Other (See Comments)    unknown  . Zithromax [Azithromycin Dihydrate] Other (See Comments)    unknown  . Dyazide  [Hydrochlorothiazide W-Triamterene] Rash  . Erythromycin Other (See Comments)    unknown  . Talwin [Pentazocine] Rash    Patient Measurements: Weight: 170 lb (77.111 kg)   Vital Signs: Temp: 101.1 F (38.4 C) (04/05 0836) Temp Source: Rectal (04/05 0836) BP: 111/55 mmHg (04/05 0900) Pulse Rate: 105 (04/05 0900)  Labs:  Recent Labs  12/17/14 0656  HGB 8.6*  HCT 30.6*  PLT 221  LABPROT 25.7*  INR 2.32*  CREATININE 1.08    Estimated Creatinine Clearance: 34.8 mL/min (by C-G formula based on Cr of 1.08).   Medical History: Past Medical History  Diagnosis Date  . Arthritis   . Chronic atrial fibrillation       chronic coumadin anticoagulation  . Hypertension   . Esophageal reflux     with paraesophageal hernia  . Diverticulosis   . Spinal stenosis     with chronic LBP  . Anemia   . Vitamin D deficiency   . Osteoporosis   . UTI (lower urinary tract infection)   . Tachy-brady syndrome   . Varicose veins   . Fatigue   . History of RIND (reversible ischemic neurological deficit)   . Hypothyroidism   . Complication of anesthesia     "very sensitive to it; hard for me to wakeup"  . Breast cancer  right  . Kidney stones   . Angina   . Pacemaker -Medtronic     DOI 2003/ Change out 2013  . UTI (lower urinary tract infection)     Assessment: 81 year old female with past medical history significant for hypertension, arthritis, A. fib on Coumadin, anemia, hypothyroidism, chronic UTIs, presenting to the ED from SNF for shortness of breath. No recent cough, fever, chills, sweats, or other illness per nurse. Denies chest pain or abdominal pain.  Pharmacy consulted to dose warfarin.  Home warfarin dose 3mg  daily Last dose 4/4 INR 2.32 (therapeutic) Hgb 8.6, HCT 30.6, Plts 221  Goal of Therapy:  INR 2-3   Plan:   Warfarin 3mg  x 1 at 1800  Daily PT/INR  Dolly Rias RPh 12/17/2014, 10:06 AM Pager [phone]

## 2014-12-17 NOTE — ED Notes (Signed)
Pt is from [place]. Per EMS, patient is experienicing respiratory distress. When fire and rescue got to patient, her O2 was in the 70's. EMS administered ALBUTEROL 10mg  and ATROVENT 0.5mg  of nebulizer. O2 sats went up to 90's. Pt is in A-fib but has a history this.

## 2014-12-17 NOTE — Progress Notes (Signed)
UR completed 

## 2014-12-17 NOTE — Progress Notes (Signed)
ANTIBIOTIC CONSULT NOTE - INITIAL  Pharmacy Consult for vancomycin Indication: pneumonia  Allergies  Allergen Reactions  . Ceftin [Cefuroxime Axetil] Shortness Of Breath and Itching  . Amiodarone Other (See Comments)    dizziness  . Beet Other (See Comments)    unknown  . Benzyl Alc-Guai-Pse Other (See Comments)    unknown  . Caffeine Other (See Comments)    unknown  . Chlordiazepoxide-Clidinium Other (See Comments)    unknown  . Chocolate Other (See Comments)    unknown  . Codeine Phosphate Other (See Comments)    unknown  . Darvon Other (See Comments)    unknown  . Dilaudid [Hydromorphone Hcl] Other (See Comments)    unknown  . Dimetapp Cold-Allergy Other (See Comments)    unknown  . Dust Mite Extract Other (See Comments)    unknown  . Estrogens Other (See Comments)    unknown  . Fungizone [Amphotericin B] Other (See Comments)    unknown  . Iodine Other (See Comments)    unknown  . Micardis [Telmisartan] Other (See Comments)    Fatigue and back pain  . Milk [Dairy] Other (See Comments)    unknown  . Mold Extract [Trichophyton Mentagrophyte] Other (See Comments)    unknown  . Motrin [Ibuprofen] Other (See Comments)    unknown  . Nitroglycerin Other (See Comments)    Head ache   . Penicillins Other (See Comments)    unknown  . Pentazocine Lactate Other (See Comments)    unknown  . Petroleum Jelly [Petrolatum] Other (See Comments)    unknown  . Pineapple Other (See Comments)    unknown  . Plavix [Clopidogrel Bisulfate] Other (See Comments)    dizziness  . Prednisone Other (See Comments)    unknown  . Soap Other (See Comments)    unknown  . Sulfamethoxazole Other (See Comments)    unknown  . Tagamet [Cimetidine] Other (See Comments)    unknown  . Tape Other (See Comments)    unknown  . Watermelon Concentrate Other (See Comments)    unknown  . Zithromax [Azithromycin Dihydrate] Other (See Comments)    unknown  . Dyazide [Hydrochlorothiazide  W-Triamterene] Rash  . Erythromycin Other (See Comments)    unknown  . Talwin [Pentazocine] Rash    Patient Measurements: Weight: 170 lb (77.111 kg)   Vital Signs: Temp: 101.7 F (38.7 C) (04/05 0454) Temp Source: Rectal (04/05 0981) BP: 115/57 mmHg (04/05 0730) Pulse Rate: 118 (04/05 0730) Intake/Output from previous day:   Intake/Output from this shift:    Labs:  Recent Labs  12/17/14 0656  WBC 10.9*  HGB 8.6*  PLT 221  CREATININE 1.08   Estimated Creatinine Clearance: 34.8 mL/min (by C-G formula based on Cr of 1.08). No results for input(s): VANCOTROUGH, VANCOPEAK, VANCORANDOM, GENTTROUGH, GENTPEAK, GENTRANDOM, TOBRATROUGH, TOBRAPEAK, TOBRARND, AMIKACINPEAK, AMIKACINTROU, AMIKACIN in the last 72 hours.   Microbiology: No results found for this or any previous visit (from the past 720 hour(s)).  Medical History: Past Medical History  Diagnosis Date  . Arthritis   . Chronic atrial fibrillation       chronic coumadin anticoagulation  . Hypertension   . Esophageal reflux     with paraesophageal hernia  . Diverticulosis   . Spinal stenosis     with chronic LBP  . Anemia   . Vitamin D deficiency   . Osteoporosis   . UTI (lower urinary tract infection)   . Tachy-brady syndrome   . Varicose veins   .  Fatigue   . History of RIND (reversible ischemic neurological deficit)   . Hypothyroidism   . Complication of anesthesia     "very sensitive to it; hard for me to wakeup"  . Breast cancer     right  . Kidney stones   . Angina   . Pacemaker -Medtronic     DOI 2003/ Change out 2013  . UTI (lower urinary tract infection)      Assessment: 81 year old female with past medical history significant for hypertension, arthritis, A. fib on Coumadin, anemia, hypothyroidism, chronic UTIs, presenting to the ED from SNF for shortness of breath.  No recent cough, fever, chills, sweats, or other illness per nurse. Denies chest pain or abdominal pain.  Code sepsis  initiated in the ED.  Pharmacy consulted to dose vancomycin for pneumonia.  Goal of Therapy:  Vancomycin trough level 15-20 mcg/ml  Plan:   Vancomycin 1gm IV x1 in ED then starting in 12 hours give vanc 1 gm IV q24h  Follow cultures,renal function, clinical course  vanc trough as needed  Dolly Rias RPh 12/17/2014, 8:07 AM Pager [phone]

## 2014-12-17 NOTE — ED Provider Notes (Signed)
CSN: [id number]     Arrival date & time 12/17/14  0603 History   First MD Initiated Contact with Patient 12/17/14 [phone]     Chief Complaint  Patient presents with  . Respiratory Distress     (Consider location/radiation/quality/duration/timing/severity/associated sxs/prior Treatment) The history is provided by the patient and medical records.    This is a 81 year old female with past medical history significant for hypertension, arthritis, A. fib on Coumadin, anemia, hypothyroidism, chronic UTIs, presenting to the ED from SNF for shortness of breath. Per patient's primary nurse, patient was doing well yesterday and carrying on about her normal activities. States she put her to bed last night she still appeared normal. She states she awoke early this morning and appeared to have some trouble breathing so nurse went to check on her and she immediately vomited while laying down. She continued having SOB when EMS arrived but no further vomiting.  Initial O2 sats around 78% when EMS arrived.  No recent cough, fever, chills, sweats, or other illness per nurse.  Denies chest pain or abdominal pain.  Per nurse, patient remains full code.  Past Medical History  Diagnosis Date  . Arthritis   . Chronic atrial fibrillation       chronic coumadin anticoagulation  . Hypertension   . Esophageal reflux     with paraesophageal hernia  . Diverticulosis   . Spinal stenosis     with chronic LBP  . Anemia   . Vitamin D deficiency   . Osteoporosis   . UTI (lower urinary tract infection)   . Tachy-brady syndrome   . Varicose veins   . Fatigue   . History of RIND (reversible ischemic neurological deficit)   . Hypothyroidism   . Complication of anesthesia     "very sensitive to it; hard for me to wakeup"  . Breast cancer     right  . Kidney stones   . Angina   . Pacemaker -Medtronic     DOI 2003/ Change out 2013  . UTI (lower urinary tract infection)    Past Surgical History  Procedure Laterality  Date  . Eye surgery      cataract bilateral  . Breast lumpectomy      right  . Tonsillectomy      "as a child"  . Appendectomy  1939  . Oophorectomy  1951    right; "had tumor there"  . Insert / replace / remove pacemaker Left     Dr Caryl Comes is cardiologist  . Pacemaker generator change N/A 11/01/2011    Procedure: PACEMAKER GENERATOR CHANGE;  Surgeon: Deboraha Sprang, MD;  Location: [facility] CATH LAB;  Service: Cardiovascular;  Laterality: N/A;   Family History  Problem Relation Age of Onset  . Cancer Sister    History  Substance Use Topics  . Smoking status: Never Smoker   . Smokeless tobacco: Never Used  . Alcohol Use: No   OB History    No data available     Review of Systems  Respiratory: Positive for shortness of breath.   Gastrointestinal: Positive for vomiting.  All other systems reviewed and are negative.     Allergies  Ceftin; Amiodarone; Beet; Benzyl alc-guai-pse; Caffeine; Chlordiazepoxide-clidinium; Chocolate; Codeine phosphate; Darvon; Dilaudid; Dimetapp cold-allergy; Dust mite extract; Estrogens; Fungizone; Iodine; Micardis; Milk; Mold extract; Motrin; Nitroglycerin; Penicillins; Pentazocine lactate; Petroleum jelly; Pineapple; Plavix; Prednisone; Soap; Sulfamethoxazole; Tagamet; Tape; Watermelon concentrate; Zithromax; Dyazide; Erythromycin; and Talwin  Home Medications   Prior to Admission medications  Medication Sig Start Date End Date Taking? Authorizing Provider  albuterol (PROVENTIL HFA;VENTOLIN HFA) 108 (90 BASE) MCG/ACT inhaler Inhale 1 puff into the lungs every 6 (six) hours as needed for wheezing or shortness of breath. 11/02/11 02/08/14  Belkys A Regalado, MD  albuterol (PROVENTIL HFA;VENTOLIN HFA) 108 (90 BASE) MCG/ACT inhaler Inhale 1 puff into the lungs every 6 (six) hours as needed for wheezing or shortness of breath.    Historical Provider, MD  cholecalciferol (VITAMIN D) 1000 UNITS tablet Take 1,000 Units by mouth daily.    Historical Provider, MD   diclofenac sodium (VOLTAREN) 1 % GEL Place 1 application onto the skin 2 (two) times daily as needed. For knee pain    Historical Provider, MD  diltiazem (CARDIZEM CD) 240 MG 24 hr capsule Take 240 mg by mouth daily.      Historical Provider, MD  famotidine (PEPCID) 10 MG tablet Take 10 mg by mouth daily as needed for heartburn.    Historical Provider, MD  fluticasone (VERAMYST) 27.5 MCG/SPRAY nasal spray Place 2 sprays into the nose daily.    Historical Provider, MD  furosemide (LASIX) 20 MG tablet Take 1 tablet (20 mg total) by mouth 2 (two) times daily. Patient taking differently: Take 20 mg by mouth daily.  02/08/14   Carmin Muskrat, MD  levothyroxine (SYNTHROID, LEVOTHROID) 75 MCG tablet Take 37.5 mcg by mouth daily.    Historical Provider, MD  loratadine (CLARITIN) 10 MG tablet Take 10 mg by mouth daily.    Historical Provider, MD  Menthol-Methyl Salicylate (MUSCLE RUB) 10-15 % CREA Apply 1 application topically 3 (three) times daily as needed for muscle pain.    Historical Provider, MD  Methylfol-Algae-B12-Acetylcyst (CEREFOLIN NAC) 6-90.314-2-600 MG TABS Take 1 tablet by mouth daily.    Historical Provider, MD  Multiple Vitamins-Minerals (ICAPS AREDS FORMULA PO) Take 1 capsule by mouth daily.    Historical Provider, MD  omeprazole (PRILOSEC) 20 MG capsule Take 20 mg by mouth daily.    Historical Provider, MD  ranitidine (ZANTAC) 150 MG tablet Take 150 mg by mouth 2 (two) times daily.    Historical Provider, MD  sertraline (ZOLOFT) 25 MG tablet Take 12.5 mg by mouth daily.    Historical Provider, MD  trandolapril (MAVIK) 1 MG tablet Take 1 mg by mouth daily.    Historical Provider, MD  warfarin (COUMADIN) 3 MG tablet Take 3 mg by mouth See admin instructions. 11/27/11   Rogelia Mire, NP   BP 156/94 mmHg  Pulse 145  Temp(Src) 98.9 F (37.2 C) (Oral)  Resp 31  SpO2 100%   Physical Exam  Constitutional: She is oriented to person, place, and time. She appears well-developed and  well-nourished. No distress.  Elderly, warm to touch  HENT:  Head: Normocephalic and atraumatic.  Mouth/Throat: Oropharynx is clear and moist.  Eyes: Conjunctivae and EOM are normal. Pupils are equal, round, and reactive to light.  Neck: Normal range of motion. Neck supple.  Cardiovascular: Normal rate, regular rhythm and normal heart sounds.   Pulmonary/Chest: Effort normal and breath sounds normal. No respiratory distress. She has no wheezes. She has no rhonchi. She has no rales.  Slightly increased work of breathing, no accessory muscle use; expiratory wheezes noted; speaking in short sentences Pacemaker left chest wall  Abdominal: Soft. Bowel sounds are normal. There is no tenderness. There is no guarding.  Musculoskeletal: Normal range of motion. She exhibits no edema.  Neurological: She is alert and oriented to person, place, and time.  Skin: Skin is warm and dry. She is not diaphoretic.  Psychiatric: She has a normal mood and affect.  Nursing note and vitals reviewed.   ED Course  Procedures (including critical care time) Labs Review Labs Reviewed  CBC WITH DIFFERENTIAL/PLATELET - Abnormal; Notable for the following:    WBC 10.9 (*)    RBC 3.83 (*)    Hemoglobin 8.6 (*)    HCT 30.6 (*)    MCH 22.5 (*)    MCHC 28.1 (*)    RDW 18.5 (*)    Neutrophils Relative % 81 (*)    Neutro Abs 8.8 (*)    Lymphocytes Relative 8 (*)    Monocytes Absolute 1.2 (*)    All other components within normal limits  COMPREHENSIVE METABOLIC PANEL - Abnormal; Notable for the following:    Glucose, Bld 211 (*)    GFR calc non Af Amer 43 (*)    GFR calc Af Amer 50 (*)    All other components within normal limits  PROTIME-INR - Abnormal; Notable for the following:    Prothrombin Time 25.7 (*)    INR 2.32 (*)    All other components within normal limits  BRAIN NATRIURETIC PEPTIDE - Abnormal; Notable for the following:    B Natriuretic Peptide 328.8 (*)    All other components within normal  limits  BLOOD GAS, ARTERIAL - Abnormal; Notable for the following:    pO2, Arterial 104.0 (*)    Bicarbonate 26.3 (*)    Acid-Base Excess 2.4 (*)    All other components within normal limits  URINALYSIS, ROUTINE W REFLEX MICROSCOPIC - Abnormal; Notable for the following:    APPearance CLOUDY (*)    Hgb urine dipstick SMALL (*)    Protein, ur 30 (*)    Nitrite POSITIVE (*)    Leukocytes, UA MODERATE (*)    All other components within normal limits  URINE MICROSCOPIC-ADD ON - Abnormal; Notable for the following:    Bacteria, UA MANY (*)    All other components within normal limits  I-STAT CG4 LACTIC ACID, ED - Abnormal; Notable for the following:    Lactic Acid, Venous 2.88 (*)    All other components within normal limits  CULTURE, BLOOD (ROUTINE X 2)  CULTURE, BLOOD (ROUTINE X 2)  URINE CULTURE  I-STAT TROPOININ, ED  I-STAT CG4 LACTIC ACID, ED    Imaging Review Dg Chest Port 1 View  12/17/2014   CLINICAL DATA:  Shortness of breath.  Sudden onset fever.  EXAM: PORTABLE CHEST - 1 VIEW  COMPARISON:  02/08/2014  FINDINGS: Shallow inspiration with elevation of right hemidiaphragm. Linear atelectasis or fibrosis in the right lung base similar to prior study. No focal consolidation in the lungs. No blunting of costophrenic angles. No pneumothorax. Old right rib fractures. Cardiac pacemaker. Probable esophageal hiatal hernia behind the heart.  IMPRESSION: Shallow inspiration with elevation of right hemidiaphragm and right lung base atelectasis or infiltration. Changes are stable since prior study.   Electronically Signed   By: Lucienne Capers M.D.   On: 12/17/2014 06:44     EKG Interpretation None      MDM   Final diagnoses:  Shortness of breath  HCAP (healthcare-associated pneumonia)  UTI (lower urinary tract infection)  Fever, unspecified fever cause   81 y.o. F with SOB and fever of 101.61F.  Code sepsis called in ED.  Mild leukocytosis and lactic acid elevated at 2.88.  U/a  nitrite +.  CXR with likely pneumonia, possibly  aspiration given her vomiting while lying supine this morning.  Blood and urine culture pending.  Patient started on broad spectrum vanc and azactam for coverage of HCAP and UTI.  HR has improved with fluids and tylenol.  Case discussed with hospitalist, Dr. Waldron Labs who will admit to step-down for further management.  Temp admission orders placed.  Larene Pickett, PA-C 12/17/14 [place], MD 12/18/14 [phone]

## 2014-12-17 NOTE — ED Notes (Signed)
Bed: EQ68 Expected date:  Expected time:  Means of arrival:  Comments: EMS Respiratory Distress

## 2014-12-17 NOTE — ED Notes (Signed)
Notified RT about ABG and neb treatment.

## 2014-12-17 NOTE — Progress Notes (Signed)
ANTIBIOTIC CONSULT NOTE - INITIAL  Pharmacy Consult for vancomycin/aztreonam Indication: pneumonia  Allergies  Allergen Reactions  . Ceftin [Cefuroxime Axetil] Shortness Of Breath and Itching  . Amiodarone Other (See Comments)    dizziness  . Beet Other (See Comments)    unknown  . Benzyl Alc-Guai-Pse Other (See Comments)    unknown  . Caffeine Other (See Comments)    unknown  . Chlordiazepoxide-Clidinium Other (See Comments)    unknown  . Chocolate Other (See Comments)    unknown  . Codeine Phosphate Other (See Comments)    unknown  . Darvon Other (See Comments)    unknown  . Dilaudid [Hydromorphone Hcl] Other (See Comments)    unknown  . Dimetapp Cold-Allergy Other (See Comments)    unknown  . Dust Mite Extract Other (See Comments)    unknown  . Estrogens Other (See Comments)    unknown  . Fungizone [Amphotericin B] Other (See Comments)    unknown  . Iodine Other (See Comments)    unknown  . Micardis [Telmisartan] Other (See Comments)    Fatigue and back pain  . Milk [Dairy] Other (See Comments)    unknown  . Mold Extract [Trichophyton Mentagrophyte] Other (See Comments)    unknown  . Motrin [Ibuprofen] Other (See Comments)    unknown  . Nitroglycerin Other (See Comments)    Head ache   . Penicillins Other (See Comments)    unknown  . Pentazocine Lactate Other (See Comments)    unknown  . Petroleum Jelly [Petrolatum] Other (See Comments)    unknown  . Pineapple Other (See Comments)    unknown  . Plavix [Clopidogrel Bisulfate] Other (See Comments)    dizziness  . Prednisone Other (See Comments)    unknown  . Soap Other (See Comments)    unknown  . Sulfamethoxazole Other (See Comments)    unknown  . Tagamet [Cimetidine] Other (See Comments)    unknown  . Tape Other (See Comments)    unknown  . Watermelon Concentrate Other (See Comments)    unknown  . Zithromax [Azithromycin Dihydrate] Other (See Comments)    unknown  . Dyazide  [Hydrochlorothiazide W-Triamterene] Rash  . Erythromycin Other (See Comments)    unknown  . Talwin [Pentazocine] Rash    Patient Measurements: Weight: 170 lb (77.111 kg)   Vital Signs: Temp: 98.3 F (36.8 C) (04/05 1219) Temp Source: Rectal (04/05 1219) BP: 118/83 mmHg (04/05 1643) Pulse Rate: 89 (04/05 1643) Intake/Output from previous day:   Intake/Output from this shift: Total I/O In: 2500 [IV Piggyback:2500] Out: -   Labs:  Recent Labs  12/17/14 0656  WBC 10.9*  HGB 8.6*  PLT 221  CREATININE 1.08   Estimated Creatinine Clearance: 34.8 mL/min (by C-G formula based on Cr of 1.08). No results for input(s): VANCOTROUGH, VANCOPEAK, VANCORANDOM, GENTTROUGH, GENTPEAK, GENTRANDOM, TOBRATROUGH, TOBRAPEAK, TOBRARND, AMIKACINPEAK, AMIKACINTROU, AMIKACIN in the last 72 hours.   Microbiology: No results found for this or any previous visit (from the past 720 hour(s)).  Medical History: Past Medical History  Diagnosis Date  . Arthritis   . Chronic atrial fibrillation       chronic coumadin anticoagulation  . Hypertension   . Esophageal reflux     with paraesophageal hernia  . Diverticulosis   . Spinal stenosis     with chronic LBP  . Anemia   . Vitamin D deficiency   . Osteoporosis   . UTI (lower urinary tract infection)   . Tachy-brady syndrome   .  Varicose veins   . Fatigue   . History of RIND (reversible ischemic neurological deficit)   . Hypothyroidism   . Complication of anesthesia     "very sensitive to it; hard for me to wakeup"  . Breast cancer     right  . Kidney stones   . Angina   . Pacemaker -Medtronic     DOI 2003/ Change out 2013  . UTI (lower urinary tract infection)      Assessment: 81 year old female with past medical history significant for hypertension, arthritis, A. fib on Coumadin, anemia, hypothyroidism, chronic UTIs, presenting to the ED from SNF for shortness of breath.  No recent cough, fever, chills, sweats, or other illness per  nurse. Denies chest pain or abdominal pain.  Code sepsis initiated in the ED.    Pharmacy consulted to dose vancomycin and aztreonam for pneumonia.  MD has also ordered metronidazole for aspiration pneumonia.  Patient received Vancomycin 1gm in ED @ 09:33  Patient received Aztreonam 2gm in ED @ 07:01  CrCl ~ 34 ml/min  Goal of Therapy:  Vancomycin trough level 15-20 mcg/ml  Plan:   Aztreonam 2gm IV q8h  Continue Vancomycin 1gm IV q24h as previously ordered; however will begin regimen 24 hr after first dose due to age/renal function.    Follow cultures,renal function, clinical course  vanc trough as needed  Leone Haven, PharmD  12/17/2014, 5:11 PM

## 2014-12-18 ENCOUNTER — Inpatient Hospital Stay (HOSPITAL_COMMUNITY): Payer: Medicare Other

## 2014-12-18 DIAGNOSIS — R739 Hyperglycemia, unspecified: Secondary | ICD-10-CM

## 2014-12-18 DIAGNOSIS — E039 Hypothyroidism, unspecified: Secondary | ICD-10-CM | POA: Diagnosis present

## 2014-12-18 DIAGNOSIS — I482 Chronic atrial fibrillation: Secondary | ICD-10-CM

## 2014-12-18 DIAGNOSIS — A419 Sepsis, unspecified organism: Secondary | ICD-10-CM

## 2014-12-18 DIAGNOSIS — J690 Pneumonitis due to inhalation of food and vomit: Secondary | ICD-10-CM

## 2014-12-18 DIAGNOSIS — I10 Essential (primary) hypertension: Secondary | ICD-10-CM

## 2014-12-18 LAB — BASIC METABOLIC PANEL
Anion gap: 8 (ref 5–15)
BUN: 18 mg/dL (ref 6–23)
CHLORIDE: 107 mmol/L (ref 96–112)
CO2: 27 mmol/L (ref 19–32)
Calcium: 8.6 mg/dL (ref 8.4–10.5)
Creatinine, Ser: 0.8 mg/dL (ref 0.50–1.10)
GFR calc non Af Amer: 62 mL/min — ABNORMAL LOW (ref >90)
GFR, EST AFRICAN AMERICAN: 72 mL/min — AB (ref >90)
Glucose, Bld: 168 mg/dL — ABNORMAL HIGH (ref 70–99)
Potassium: 4.2 mmol/L (ref 3.5–5.1)
SODIUM: 142 mmol/L (ref 135–145)

## 2014-12-18 LAB — BLOOD GAS, ARTERIAL
ACID-BASE EXCESS: 0.8 mmol/L (ref 0.0–2.0)
Bicarbonate: 25.4 mEq/L — ABNORMAL HIGH (ref 20.0–24.0)
DRAWN BY: 307971
O2 CONTENT: 5 L/min
O2 Saturation: 96.8 %
PO2 ART: 88.6 mmHg (ref 80.0–100.0)
Patient temperature: 37
TCO2: 24.3 mmol/L (ref 0–100)
pCO2 arterial: 43.5 mmHg (ref 35.0–45.0)
pH, Arterial: 7.383 (ref 7.350–7.450)

## 2014-12-18 LAB — CBC
HCT: 27.4 % — ABNORMAL LOW (ref 36.0–46.0)
Hemoglobin: 7.8 g/dL — ABNORMAL LOW (ref 12.0–15.0)
MCH: 22.9 pg — ABNORMAL LOW (ref 26.0–34.0)
MCHC: 28.5 g/dL — AB (ref 30.0–36.0)
MCV: 80.4 fL (ref 78.0–100.0)
PLATELETS: 180 10*3/uL (ref 150–400)
RBC: 3.41 MIL/uL — ABNORMAL LOW (ref 3.87–5.11)
RDW: 18.8 % — AB (ref 11.5–15.5)
WBC: 14.4 10*3/uL — AB (ref 4.0–10.5)

## 2014-12-18 LAB — IRON AND TIBC
Iron: <10 ug/dL — ABNORMAL LOW (ref 42–145)
UIBC: 369 ug/dL (ref 125–400)

## 2014-12-18 LAB — FOLATE: Folate: >20 ng/mL

## 2014-12-18 LAB — VITAMIN B12: Vitamin B-12: >2000 pg/mL — ABNORMAL HIGH (ref 211–911)

## 2014-12-18 LAB — FERRITIN: Ferritin: 18 ng/mL (ref 10–291)

## 2014-12-18 LAB — PROTIME-INR
INR: 2.88 — AB (ref 0.00–1.49)
PROTHROMBIN TIME: 30.4 s — AB (ref 11.6–15.2)

## 2014-12-18 LAB — OCCULT BLOOD X 1 CARD TO LAB, STOOL: Fecal Occult Bld: NEGATIVE

## 2014-12-18 LAB — BRAIN NATRIURETIC PEPTIDE: B NATRIURETIC PEPTIDE 5: 691.4 pg/mL — AB (ref 0.0–100.0)

## 2014-12-18 LAB — RETICULOCYTES
RBC.: 3.55 MIL/uL — AB (ref 3.87–5.11)
RETIC COUNT ABSOLUTE: 60.4 10*3/uL (ref 19.0–186.0)
RETIC CT PCT: 1.7 % (ref 0.4–3.1)

## 2014-12-18 MED ORDER — WARFARIN SODIUM 1 MG PO TABS
1.5000 mg | ORAL_TABLET | Freq: Once | ORAL | Status: AC
  Administered 2014-12-18: 1.5 mg via ORAL
  Filled 2014-12-18: qty 1

## 2014-12-18 MED ORDER — VITAMIN D3 25 MCG (1000 UNIT) PO TABS
1000.0000 [IU] | ORAL_TABLET | Freq: Every day | ORAL | Status: DC
  Administered 2014-12-19 – 2014-12-23 (×5): 1000 [IU] via ORAL
  Filled 2014-12-18 (×5): qty 1

## 2014-12-18 MED ORDER — OXYCODONE HCL 5 MG PO TABS
10.0000 mg | ORAL_TABLET | Freq: Four times a day (QID) | ORAL | Status: DC | PRN
  Administered 2014-12-19: 10 mg via ORAL
  Filled 2014-12-18 (×2): qty 2

## 2014-12-18 MED ORDER — TRANDOLAPRIL 1 MG PO TABS
1.0000 mg | ORAL_TABLET | Freq: Every day | ORAL | Status: DC
Start: 2014-12-18 — End: 2014-12-24
  Administered 2014-12-18 – 2014-12-23 (×6): 1 mg via ORAL
  Filled 2014-12-18 (×6): qty 1

## 2014-12-18 MED ORDER — LEVOFLOXACIN 750 MG PO TABS
750.0000 mg | ORAL_TABLET | ORAL | Status: DC
  Administered 2014-12-18 – 2014-12-22 (×2): 750 mg via ORAL
  Filled 2014-12-18 (×3): qty 1

## 2014-12-18 MED ORDER — FUROSEMIDE 20 MG PO TABS
20.0000 mg | ORAL_TABLET | Freq: Every day | ORAL | Status: DC
  Administered 2014-12-19 – 2014-12-20 (×2): 20 mg via ORAL
  Filled 2014-12-18 (×3): qty 1

## 2014-12-18 MED ORDER — LEVOTHYROXINE SODIUM 75 MCG PO TABS
37.5000 ug | ORAL_TABLET | Freq: Every day | ORAL | Status: DC
  Administered 2014-12-18 – 2014-12-23 (×6): 37.5 ug via ORAL
  Filled 2014-12-18 (×3): qty 0.5
  Filled 2014-12-18 (×2): qty 1
  Filled 2014-12-18 (×2): qty 0.5

## 2014-12-18 MED ORDER — KETOROLAC TROMETHAMINE 15 MG/ML IJ SOLN: 15.0000 mg | Freq: Once | INTRAMUSCULAR | Status: DC

## 2014-12-18 MED ORDER — ENOXAPARIN SODIUM 40 MG/0.4ML ~~LOC~~ SOLN: 40.0000 mg | SUBCUTANEOUS | Status: DC

## 2014-12-18 MED ORDER — DILTIAZEM HCL ER COATED BEADS 180 MG PO CP24
180.0000 mg | ORAL_CAPSULE | Freq: Every day | ORAL | Status: DC
  Administered 2014-12-18 – 2014-12-23 (×6): 180 mg via ORAL
  Filled 2014-12-18 (×6): qty 1

## 2014-12-18 MED ORDER — LORATADINE 10 MG PO TABS
10.0000 mg | ORAL_TABLET | Freq: Every day | ORAL | Status: DC
  Administered 2014-12-19 – 2014-12-23 (×5): 10 mg via ORAL
  Filled 2014-12-18 (×5): qty 1

## 2014-12-18 MED ORDER — SERTRALINE HCL 25 MG PO TABS
12.5000 mg | ORAL_TABLET | Freq: Every day | ORAL | Status: DC
  Administered 2014-12-18 – 2014-12-23 (×6): 12.5 mg via ORAL
  Filled 2014-12-18 (×6): qty 0.5

## 2014-12-18 MED ORDER — FUROSEMIDE 10 MG/ML IJ SOLN
20.0000 mg | Freq: Once | INTRAMUSCULAR | Status: AC
  Administered 2014-12-18: 20 mg via INTRAVENOUS
  Filled 2014-12-18: qty 2

## 2014-12-18 MED ORDER — PANTOPRAZOLE SODIUM 40 MG PO TBEC
40.0000 mg | DELAYED_RELEASE_TABLET | Freq: Every day | ORAL | Status: DC
  Administered 2014-12-18 – 2014-12-23 (×6): 40 mg via ORAL
  Filled 2014-12-18 (×6): qty 1

## 2014-12-18 MED ORDER — LEVOTHYROXINE SODIUM 100 MCG IV SOLR
19.0000 ug | Freq: Every day | INTRAVENOUS | Status: DC
Start: 2014-12-18 — End: 2014-12-18
  Filled 2014-12-18: qty 5

## 2014-12-18 MED ORDER — OXYCODONE HCL 5 MG PO TABS
10.0000 mg | ORAL_TABLET | Freq: Once | ORAL | Status: AC
  Administered 2014-12-18: 10 mg via ORAL

## 2014-12-18 MED ORDER — OXYCODONE HCL 5 MG PO TABS
10.0000 mg | ORAL_TABLET | Freq: Three times a day (TID) | ORAL | Status: DC | PRN
  Administered 2014-12-18: 10 mg via ORAL
  Filled 2014-12-18: qty 2

## 2014-12-18 NOTE — Progress Notes (Addendum)
Clinical Social Work Department CLINICAL SOCIAL WORK PLACEMENT NOTE 12/18/2014  Patient:  [name], [name]  Account Number:  [id number] Admit date:  12/17/2014  Clinical Social Worker:  Maryln Manuel  Date/time:  12/18/2014 01:30 PM  Clinical Social Work is seeking post-discharge placement for this patient at the following level of care:   SKILLED NURSING   (*CSW will update this form in Epic as items are completed)   12/18/2014  Patient/family provided with [facility] Department of Clinical Social Work's list of facilities offering this level of care within the geographic area requested by the patient (or if unable, by the patient's family).  12/18/2014  Patient/family informed of their freedom to choose among providers that offer the needed level of care, that participate in Medicare, Medicaid or managed care program needed by the patient, have an available bed and are willing to accept the patient.  12/18/2014  Patient/family informed of [facility]' ownership interest in [facility], as well as of the fact that they are under no obligation to receive care at this facility.  PASARR submitted to EDS on 12/18/2014 PASARR number received on 12/18/2014  FL2 transmitted to all facilities in geographic area requested by pt/family on  12/18/2014 FL2 transmitted to all facilities within larger geographic area on   Patient informed that his/her managed care company has contracts with or will negotiate with  certain facilities, including the following:     Patient/family informed of bed offers received:  12/20/2014 Patient chooses bed at N/A Physician recommends and patient chooses bed at    Patient to be transferred to  on  [place] [place] with 24 hour caregivers.  Patient to be transferred to facility by ambulance [facility]) Patient and family notified of transfer on 12/23/2014 Name of family member notified:  Pt and pt niece, Harriett notified at bedside.  The  following physician request were entered in Epic:   Additional Comments: Pt from [place]; may need short term stay at [facility] level of care.   Alison Murray, MSW, [place] Work [phone]

## 2014-12-18 NOTE — Progress Notes (Signed)
Clinical Social Work Department BRIEF PSYCHOSOCIAL ASSESSMENT 12/18/2014  Patient:  [name], [name]     Account Number:  [id number]     Admit date:  12/17/2014  Clinical Social Worker:  Marcial Pless Inez Catalina  Date/Time:  12/17/2014 12:30 PM  Referred by:  Physician  Date Referred:  12/17/2014 Referred for  SNF Placement  ALF Placement   Other Referral:   Interview type:  Family Other interview type:    PSYCHOSOCIAL DATA Living Status:  FACILITY Admitted from facility:  [place] Level of care:  Independent Living Primary support name:  [name] Primary support relationship to patient:  FRIEND Degree of support available:   HCPOA    pt also has strong support from niece, and caregivers    CURRENT CONCERNS Current Concerns  Post-Acute Placement   Other Concerns:    SOCIAL WORK ASSESSMENT / PLAN CSW met with pt, pt niece, and pt caregiver at bedside. Pt unable to engage in assessment at this time, pt on non rebreather. Pt niece and caregiver share that paitent friend [name] is HCPOA. Patient HCPOA can be reached at [phone] or [phone]. Pt HCPOA confirms along with niece and caregiver, pt is a resident at [facility] in [place] living. Pt has 24 hour care by personal caregivers in her apartment. Pt HCPOA is aware that patient may need short term rehab at [place] if recommended by physical therapy. CSW and pt HCPOA discussed other options if needed including home health for services in the apartment if approrpriate. Pt HCPOA, niece, and caregivers are all hopeful for patient to return to her apartment. CSW provided supportive counseling as family was unsure of patient course of care and potential outcomes   Assessment/plan status:  Psychosocial Support/Ongoing Assessment of Needs Other assessment/ plan:   Information/referral to community resources:   none identified at this time    PATIENT'S/FAMILY'S RESPONSE TO PLAN  OF CARE: Pt HCPOA, nice, and caregivers hope for patient ot return to indepedent apartment with caregivers when medically stable. Pt HCPOA verbalized understanding for potential for higher level of care to be possibly needed. Pt HCPOA would prefer patient to go to  [facility] snf if skilled nursing needed.    CSW to continue to follow to assess for potential higher level of care and dispositon needs. If patient discharges home wiht private duty caregivers as currently in place, please follow up with nurse case manager for further home disposition needs.   Belia Heman, [place] Work  [employer] [phone]

## 2014-12-18 NOTE — Evaluation (Signed)
Clinical/Bedside Swallow Evaluation Patient Details  Name: [name] MRN: [id number] Date of Birth: 1920/05/24  Today's Date: 12/18/2014 Time: SLP Start Time (ACUTE ONLY): 1450 SLP Stop Time (ACUTE ONLY): 1530 SLP Time Calculation (min) (ACUTE ONLY): 40 min  Past Medical History:  Past Medical History  Diagnosis Date  . Arthritis   . Chronic atrial fibrillation       chronic coumadin anticoagulation  . Hypertension   . Esophageal reflux     with paraesophageal hernia  . Diverticulosis   . Spinal stenosis     with chronic LBP  . Anemia   . Vitamin D deficiency   . Osteoporosis   . UTI (lower urinary tract infection)   . Tachy-brady syndrome   . Varicose veins   . Fatigue   . History of RIND (reversible ischemic neurological deficit)   . Hypothyroidism   . Complication of anesthesia     "very sensitive to it; hard for me to wakeup"  . Breast cancer     right  . Kidney stones   . Angina   . Pacemaker -[employer]     DOI 2003/ Change out 2013  . UTI (lower urinary tract infection)    Past Surgical History:  Past Surgical History  Procedure Laterality Date  . Eye surgery      cataract bilateral  . Breast lumpectomy      right  . Tonsillectomy      "as a child"  . Appendectomy  1939  . Oophorectomy  1951    right; "had tumor there"  . Insert / replace / remove pacemaker Left     Dr Caryl Comes is cardiologist  . Pacemaker generator change N/A 11/01/2011    Procedure: PACEMAKER GENERATOR CHANGE;  Surgeon: Deboraha Sprang, MD;  Location: [facility] CATH LAB;  Service: Cardiovascular;  Laterality: N/A;   HPI:  [name] adm to [facility] with sepsis secondary to aspiration pna.  PMH + for afib, admits for dyspnea, large hiatal hernia *(paraesophageal), reflux.  Pt resides at ALF and hopes to return to this level when dc.  Per admit note, pt found to be coughing in her room by caregiver followed by vomiting.  CXR showed right base ATX or infiltrate.  Pt takes reflux medications.   Swallow eval ordered. Caregiver admits to pt required heimlich maneuver x1 on meat.  No weight loss nor recent pnas.  Last year pneumonia around the same time.     Assessment / Plan / Recommendation Clinical Impression  Pt with negative CN exam and she admits to occasional issues with swallowing causing her to choke with food/drink approximately twice a month. Today pt does present with increased WOB that may allow episodic aspiration - caregivers present who work with pt report she has some shortness of breath and expiratory wheeze at baseline.  No indications of aspiration or airway compromise with intake but pt reports she "needs to be careful" showing good awareness.   Pt also with extensive h/o LARGE hiatal hernia with reflux and along with her spondylosis, suspect this impacts her swallowing.     Recommend mechanical soft/thin with strict aspiration precautions.  Of note, caregiver present reports pt was lying in bed sleeping when she noted she was not "right" and upon caregiver sitting pt up, pt vomited and expectorated yellow tinged acidtic foamy secretions.    Using teach back, educated pt and caregivers to importance to follow strict aspiration precautions.  Will follow briefly to help mitigate aspiration -  not functionally prevent in this pt.      Aspiration Risk    moderate and ongoing   Diet Recommendation Dysphagia 3 (Mechanical Soft);Thin liquid   Liquid Administration via: Cup Medication Administration: Whole meds with puree (follow w liquids) Supervision: Patient able to self feed Compensations: Slow rate;Small sips/bites (stop intake if short of breath) Postural Changes and/or Swallow Maneuvers: Upright 30-60 min after meal;Seated upright 90 degrees (STRICT REFLUX precautions)    Other  Recommendations Oral Care Recommendations: Oral care BID   Follow Up Recommendations  None    Frequency and Duration min 1 x/week  1 week   Pertinent Vitals/Pain Low grade fever      Swallow Study Prior Functional Status   h/o esophageal dysphagia    General Date of Onset: 12/18/14 HPI: [name] adm to [facility] with sepsis secondary to aspiration pna.  PMH + for afib, admits for dyspnea, large hiatal hernia *(paraesophageal), reflux.  Pt resides at ALF and hopes to return to this level when dc.  Per admit note, pt found to be coughing in her room by caregiver followed by vomiting.  CXR showed right base ATX or infiltrate.  Pt takes reflux medications.  Swallow eval ordered. Caregiver admits to pt required heimlich maneuver x1 on meat.  No weight loss nor recent pnas.  Last year pneumonia around the same time.   Type of Study: Bedside swallow evaluation Previous Swallow Assessment: esophagram - dysmotlity, large hiatal hernia, reflux Diet Prior to this Study: NPO Temperature Spikes Noted: Yes Respiratory Status: Nasal cannula History of Recent Intubation: No Behavior/Cognition: Alert;Cooperative;Pleasant mood Self-Feeding Abilities: Able to feed self Patient Positioning: Upright in bed Baseline Vocal Quality: Hoarse Volitional Cough: Strong Volitional Swallow: Able to elicit    Oral/Motor/Sensory Function Overall Oral Motor/Sensory Function:  (negative CN exam)   Ice Chips Ice chips: Not tested   Thin Liquid Thin Liquid: Within functional limits Presentation: Self Fed;Cup;Straw    Nectar Thick Nectar Thick Liquid: Not tested   Honey Thick Honey Thick Liquid: Not tested   Puree Puree: Within functional limits Presentation: Self Fed;Spoon   Solid   GO    Solid: Within functional limits Presentation: [place], [place] [facility] SLP [phone]

## 2014-12-18 NOTE — Evaluation (Signed)
Physical Therapy Evaluation Patient Details Name: [name] MRN: [id number] DOB: 1920-06-23 Today's Date: 12/18/2014   History of Present Illness  Admitted with dx of sepsis.  Hx of a-fib and pacemaker  Clinical Impression  Pt admitted with dx of sepsis and currently requiring max assist of 2 for safe performance of basic mobility.  Pt hopes to dc back to ALF but may require follow up rehab at SNF level dependent on acute stay progress and level of assist available at facility.    Follow Up Recommendations SNF;Home health PT    Equipment Recommendations  None recommended by PT    Recommendations for Other Services OT consult     Precautions / Restrictions Precautions Precautions: Fall Restrictions Weight Bearing Restrictions: No      Mobility  Bed Mobility Overal bed mobility: Needs Assistance;+2 for physical assistance Bed Mobility: Supine to Sit     Supine to sit: +2 for physical assistance;+2 for safety/equipment;Min assist     General bed mobility comments: cues for sequence   Transfers Overall transfer level: Needs assistance Equipment used: Rolling walker (2 wheeled) Transfers: Sit to/from Stand Sit to Stand: Mod assist;+2 physical assistance;+2 safety/equipment Stand pivot transfers: Max assist;+2 physical assistance;+2 safety/equipment       General transfer comment: Pt with post lean and panics when balance lost - required max assist of 2 to prevent fall bkwd and rotate to sit in chair  Ambulation/Gait Ambulation/Gait assistance: Mod assist;+2 physical assistance;+2 safety/equipment Ambulation Distance (Feet): 2 Feet Assistive device: Rolling walker (2 wheeled) Gait Pattern/deviations: Step-to pattern;Decreased step length - right;Decreased step length - left;Shuffle;Leaning posteriorly     General Gait Details: Post lean, short shuffling step, high anxiety  Stairs            Wheelchair Mobility    Modified Rankin (Stroke Patients  Only)       Balance                                             Pertinent Vitals/Pain Pain Assessment: No/denies pain    Home Living Family/patient expects to be discharged to:: Assisted living               Home Equipment: Walker - 2 wheels      Prior Function Level of Independence: Needs assistance   Gait / Transfers Assistance Needed: Used RW and required assist for balance     Comments: Assisted living resident     Hand Dominance        Extremity/Trunk Assessment   Upper Extremity Assessment: Generalized weakness           Lower Extremity Assessment: Overall WFL for tasks assessed      Cervical / Trunk Assessment: Kyphotic  Communication   Communication: HOH  Cognition Arousal/Alertness: Awake/alert Behavior During Therapy: WFL for tasks assessed/performed Overall Cognitive Status: Within Functional Limits for tasks assessed       Memory: Decreased short-term memory              General Comments      Exercises        Assessment/Plan    PT Assessment Patient needs continued PT services  PT Diagnosis Difficulty walking   PT Problem List Decreased strength;Decreased activity tolerance;Decreased balance;Decreased mobility;Decreased cognition;Decreased knowledge of use of DME;Obesity  PT Treatment Interventions DME instruction;Gait training;Functional mobility training;Therapeutic activities;Therapeutic exercise;Balance training;Patient/family education  PT Goals (Current goals can be found in the Care Plan section) Acute Rehab PT Goals Patient Stated Goal: Home PT Goal Formulation: With patient Time For Goal Achievement: 01/01/15 Potential to Achieve Goals: Fair    Frequency Min 3X/week   Barriers to discharge        Co-evaluation               End of Session Equipment Utilized During Treatment: Gait belt;Oxygen Activity Tolerance: Patient limited by fatigue Patient left: in chair;with call  bell/phone within reach;with family/visitor present;with nursing/sitter in room Nurse Communication: Mobility status         Time: 1130-1200 PT Time Calculation (min) (ACUTE ONLY): 30 min   Charges:   PT Evaluation $Initial PT Evaluation Tier I: 1 Procedure PT Treatments $Therapeutic Activity: 8-22 mins   PT G Codes:        Tara Wich 2015/01/16, 12:47 PM

## 2014-12-18 NOTE — Progress Notes (Signed)
Patient complaining of shortness of breath. Wheezing noted bilaterally. Breathing labored with respirations at 30. Patient remains in  A-fib with an increase in heart rate of 130. Dr. Rockne Menghini notified,  new orders received will continue to monitor and notify MD as needed.

## 2014-12-18 NOTE — Care Management Note (Signed)
CARE MANAGEMENT NOTE 12/18/2014  Patient:  [name], [name]   Account Number:  [id number]  Date Initiated:  12/18/2014  Documentation initiated by:  Loann Chahal  Subjective/Objective Assessment:   patient lives at skilled nursing facility, brought secondary to respiratory distress, a shunt caregiver at bedside, reports she woke up at night with patient in respiratory distress, as well noticed some vomiting, so they called ED, NAD pat     Action/Plan:   snf   Anticipated DC Date:  12/21/2014   Anticipated DC Plan:  SKILLED NURSING FACILITY  In-house referral  Clinical Social Worker      DC Planning Services  NA      [facility] Choice  NA   Choice offered to / List presented to:  NA   DME arranged  NA      DME agency  NA     [place] arranged  NA      [place] agency  NA   Status of service:  In process, will continue to follow Medicare Important Message given?   (If response is "NO", the following Medicare IM given date fields will be blank) Date Medicare IM given:   Medicare IM given by:   Date Additional Medicare IM given:   Additional Medicare IM given by:    Discharge Disposition:    Per UR Regulation:  Reviewed for med. necessity/level of care/duration of stay  If discussed at [place] of Stay Meetings, dates discussed:    Comments:  December 18, 2014/Dawn Mccowan L. Rosana Hoes, RN, BSN, CCM. Case Management [place] [phone] No discharge needs present of time of review.

## 2014-12-18 NOTE — Progress Notes (Addendum)
CRITICAL VALUE ALERT  Critical value received:  Blood Culture Gram Negative Rods  Date of notification:  12/18/2014   Time of notification:  0000  Critical value read back:Yes.    Nurse who received alert:  Jeanie Sewer   MD notified (1st page): Chaney Malling NP  Time of first page:  0000  MD notified (2nd page):  Time of second page:  Responding MD:  none  Time MD responded:

## 2014-12-18 NOTE — Progress Notes (Signed)
ANTICOAGULATION CONSULT NOTE - Follow Up  Pharmacy Consult for warfarin Indication: Atrial Fibrilation  Allergies  Allergen Reactions  . Ceftin [Cefuroxime Axetil] Shortness Of Breath and Itching  . Amiodarone Other (See Comments)    dizziness  . Beet Other (See Comments)    unknown  . Benzyl Alc-Guai-Pse Other (See Comments)    unknown  . Caffeine Other (See Comments)    unknown  . Chlordiazepoxide-Clidinium Other (See Comments)    unknown  . Chocolate Other (See Comments)    unknown  . Codeine Phosphate Other (See Comments)    unknown  . Darvon Other (See Comments)    unknown  . Dilaudid [Hydromorphone Hcl] Other (See Comments)    unknown  . Dimetapp Cold-Allergy Other (See Comments)    unknown  . Dust Mite Extract Other (See Comments)    unknown  . Estrogens Other (See Comments)    unknown  . Fungizone [Amphotericin B] Other (See Comments)    unknown  . Iodine Other (See Comments)    unknown  . Micardis [Telmisartan] Other (See Comments)    Fatigue and back pain  . Milk [Dairy] Other (See Comments)    unknown  . Mold Extract [Trichophyton Mentagrophyte] Other (See Comments)    unknown  . Motrin [Ibuprofen] Other (See Comments)    unknown  . Nitroglycerin Other (See Comments)    Head ache   . Penicillins Other (See Comments)    unknown  . Pentazocine Lactate Other (See Comments)    unknown  . Petroleum Jelly [Petrolatum] Other (See Comments)    unknown  . Pineapple Other (See Comments)    unknown  . Plavix [Clopidogrel Bisulfate] Other (See Comments)    dizziness  . Prednisone Other (See Comments)    unknown  . Soap Other (See Comments)    unknown  . Sulfamethoxazole Other (See Comments)    unknown  . Tagamet [Cimetidine] Other (See Comments)    unknown  . Tape Other (See Comments)    unknown  . Watermelon Concentrate Other (See Comments)    unknown  . Zithromax [Azithromycin Dihydrate] Other (See Comments)    unknown  . Dyazide  [Hydrochlorothiazide W-Triamterene] Rash  . Erythromycin Other (See Comments)    unknown  . Talwin [Pentazocine] Rash    Patient Measurements: Height: 5\' 3"  (160 cm) Weight: 180 lb 12.4 oz (82 kg) IBW/kg (Calculated) : 52.4   Vital Signs: Temp: 98 F (36.7 C) (04/06 0728) Temp Source: Oral (04/06 0728) BP: 151/93 mmHg (04/06 1020) Pulse Rate: 53 (04/06 0700)  Labs:  Recent Labs  12/17/14 0656 12/18/14 0325  HGB 8.6* 7.8*  HCT 30.6* 27.4*  PLT 221 180  LABPROT 25.7* 30.4*  INR 2.32* 2.88*  CREATININE 1.08 0.80    Estimated Creatinine Clearance: 45.5 mL/min (by C-G formula based on Cr of 0.8).   Assessment: 81 year old female with past medical history significant for hypertension, arthritis, A. fib on Coumadin, anemia, hypothyroidism, chronic UTIs, presenting to the ED from SNF for shortness of breath after vomiting.  Admitted with ARF and sepsis 2/2 likely aspiration pneumonia.  Pharmacy consulted to dose warfarin.  Home warfarin dose 3mg  daily INR 2.88 (therapeutic) Hgb declined to 7.8, Plts 180. No bleeding/complications reported.  Monitor CBC.] Patient is currently NPO. Antibiotics may enhance activity of warfarin, causing increased INR (Flagyl)  Goal of Therapy:  INR 2-3   Plan:  1.  Warfarin 1.5 mg po once today.  INR therapeutic today; however, had increased significantly since yesterday  likely due to acute illness, vomiting.  Will provide lower dose today to avoid supratherapeutic INR. 2.  Daily INR.   Hershal Coria, PharmD, BCPS Pager: [phone] 12/18/2014 11:49 AM

## 2014-12-18 NOTE — Progress Notes (Signed)
Progress Note   MERCADIES CO BTD:[id number] DOB: 21-May-1920 DOA: 12/17/2014 PCP: Abigail Miyamoto, MD   Brief Narrative:   [name] is an 81 y.o. female with a PMH of hypertension, arthritis, atrial fibrillation on warfarin, anemia and hypothyroidism who was admitted 12/17/14 from her SNF with respiratory distress after vomiting. On arrival, the patient was hypoxic with a saturation of 78% on room air.  Chest x-ray showed worsening right lung infiltration.  Assessment/Plan:   Principal Problem:   Sepsis secondary to aspiration pneumonia and acute respiratory failure with hypoxia - Being monitored in the SDU. - Continue aztreonam, Flagyl and vancomycin. Continue oxygen support. - Speech therapy consulted for swallowing evaluation. - Follow-up blood cultures.  Active Problems:   Normocytic anemia - Likely anemia of chronic disease. - Heme check stools. - Check an anemia panel.    Hyperglycemia - Check hemoglobin A1c.    Atrial fibrillation - Continue warfarin for anticoagulation. - Cardizem currently on hold, rate controlled at present.    Diastolic HF (heart failure) - Monitor fluid volume status.    Hypertension - Oral medications remain on hold pending swallowing evaluation.    UTI (lower urinary tract infection) - On broad-spectrum antibiotics. Urinalysis positive for nitrites and leukocytes. Many bacteria on microscopy. - Follow-up urine cultures.    Hypothyroidism - Give Synthroid IV at half the normal dose.    DVT Prophylaxis - Anticoagulated with Coumadin.  Code Status: DNR Family Communication: Caregiver updated at bedside. Disposition Plan: From [facility], has 24 hour caregivers, to return there when stable.   IV Access:    Peripheral IV   Procedures and diagnostic studies:   Dg Chest Port 1 View 12/17/2014: Shallow inspiration with elevation of right hemidiaphragm and right lung base atelectasis or infiltration. Changes  are stable since prior study.    Medical Consultants:    None.  Anti-Infectives:    Vancomycin 12/17/14--->  Aztreonam 12/17/14--->  Flagyl 12/17/14--->  Subjective:   [name] says she is breathing better.  Still having coughing spells.  No chest pain.  Has not eaten in past 2 days.  Last BM was this morning.    Objective:    Filed Vitals:   12/18/14 0400 12/18/14 0500 12/18/14 0600 12/18/14 0700  BP: 136/65 143/78 145/69 151/82  Pulse: 31 88 93 53  Temp: 99 F (37.2 C)     TempSrc: Axillary     Resp: 19 15 20 24   Weight: 82 kg (180 lb 12.4 oz)     SpO2: 99% 94% 99% 100%    Intake/Output Summary (Last 24 hours) at 12/18/14 0708 Last data filed at 12/17/14 1859  Gross per 24 hour  Intake   2740 ml  Output      0 ml  Net   2740 ml    Exam: Gen:  NAD Cardiovascular:  HSIR, No M/R/G Respiratory:  Lungs diminished with bibasilar crackles. Gastrointestinal:  Abdomen soft, NT/ND, + BS Extremities:  No C/E/C, petechial rash (caregiver says chronic and from "surface phlebitis")   Data Reviewed:    Labs: Basic Metabolic Panel:  Recent Labs Lab 12/17/14 0656 12/18/14 0325  NA 138 142  K 3.8 4.2  CL 98 107  CO2 28 27  GLUCOSE 211* 168*  BUN 14 18  CREATININE 1.08 0.80  CALCIUM 8.5 8.6   GFR Estimated Creatinine Clearance: 48.5 mL/min (by C-G formula based on Cr of 0.8). Liver Function Tests:  Recent Labs Lab 12/17/14 [phone]  AST 23  ALT 13  ALKPHOS 97  BILITOT 0.6  PROT 6.6  ALBUMIN 3.8   Coagulation profile  Recent Labs Lab 12/17/14 0656 12/18/14 0325  INR 2.32* 2.88*    CBC:  Recent Labs Lab 12/17/14 0656 12/18/14 0325  WBC 10.9* 14.4*  NEUTROABS 8.8*  --   HGB 8.6* 7.8*  HCT 30.6* 27.4*  MCV 79.9 80.4  PLT 221 180   BNP (last 3 results)  Recent Labs  02/08/14 1230  PROBNP 2218.0*   Hgb A1c: No results for input(s): HGBA1C in the last 72 hours.  Anemia work up: No results for input(s): VITAMINB12, FOLATE,  FERRITIN, TIBC, IRON, RETICCTPCT in the last 72 hours. Sepsis Labs:  Recent Labs Lab 12/17/14 0656 12/17/14 0706 12/17/14 1045 12/18/14 0325  WBC 10.9*  --   --  14.4*  LATICACIDVEN  --  2.88* 2.88*  --    Microbiology Recent Results (from the past 240 hour(s))  Blood culture (routine x 2)     Status: None (Preliminary result)   Collection Time: 12/17/14  6:50 AM  Result Value Ref Range Status   Specimen Description BLOOD LEFT ANTECUBITAL  Final   Special Requests BOTTLES DRAWN AEROBIC AND ANAEROBIC 5ML  Final   Culture   Final    GRAM NEGATIVE RODS Note: Gram Stain Report Called to,Read Back By and Verified With: CHRISTINE SIMON AT 10:10 P.M. ON 12/17/2014 WARRB Performed at [facility]    Report Status PENDING  Incomplete  Blood culture (routine x 2)     Status: None (Preliminary result)   Collection Time: 12/17/14  6:55 AM  Result Value Ref Range Status   Specimen Description BLOOD RIGHT ANTECUBITAL  Final   Special Requests BOTTLES DRAWN AEROBIC AND ANAEROBIC 5ML  Final   Culture   Final    GRAM NEGATIVE RODS Note: Gram Stain Report Called to,Read Back By and Verified With: CHRISTINE SIMON AT 10:10 PM.M ON 12/17/2014 WARRB Performed at [facility]    Report Status PENDING  Incomplete  MRSA PCR Screening     Status: None   Collection Time: 12/17/14  5:31 PM  Result Value Ref Range Status   MRSA by PCR NEGATIVE NEGATIVE Final    Comment:        The GeneXpert MRSA Assay (FDA approved for NASAL specimens only), is one component of a comprehensive MRSA colonization surveillance program. It is not intended to diagnose MRSA infection nor to guide or monitor treatment for MRSA infections.      Medications:   . aztreonam  2 g Intravenous Q8H  . fluticasone  2 spray Each Nare Daily  . ipratropium-albuterol  3 mL Nebulization Q6H  . metronidazole  500 mg Intravenous Q8H  . sodium chloride  3 mL Intravenous Q12H  . vancomycin  1,000 mg Intravenous  Q24H  . Warfarin - Pharmacist Dosing Inpatient   Does not apply q1800   Continuous Infusions: . sodium chloride 75 mL/hr at 12/17/14 1747    Time spent: 35 minutes with > 50% of time discussing current diagnostic test results, clinical impression and plan of care.    LOS: 1 day   Dareld Mcauliffe  [facility] Pager [phone]. If unable to reach me by pager, please call my cell phone at [phone].  *Please refer to [email or web address], password [email or web address] to get updated schedule on who will round on this patient, as hospitalists switch teams weekly. If 7PM-7AM, please contact night-coverage at [email or web address], password [email or web address] for any overnight needs.  12/18/2014, 7:08 AM

## 2014-12-19 DIAGNOSIS — D509 Iron deficiency anemia, unspecified: Secondary | ICD-10-CM | POA: Diagnosis present

## 2014-12-19 DIAGNOSIS — I5033 Acute on chronic diastolic (congestive) heart failure: Secondary | ICD-10-CM | POA: Diagnosis present

## 2014-12-19 LAB — CBC
HEMATOCRIT: 27.6 % — AB (ref 36.0–46.0)
Hemoglobin: 7.8 g/dL — ABNORMAL LOW (ref 12.0–15.0)
MCH: 22.8 pg — ABNORMAL LOW (ref 26.0–34.0)
MCHC: 28.3 g/dL — AB (ref 30.0–36.0)
MCV: 80.7 fL (ref 78.0–100.0)
Platelets: 205 10*3/uL (ref 150–400)
RBC: 3.42 MIL/uL — ABNORMAL LOW (ref 3.87–5.11)
RDW: 18.9 % — ABNORMAL HIGH (ref 11.5–15.5)
WBC: 13.5 10*3/uL — ABNORMAL HIGH (ref 4.0–10.5)

## 2014-12-19 LAB — BASIC METABOLIC PANEL
Anion gap: 10 (ref 5–15)
BUN: 19 mg/dL (ref 6–23)
CALCIUM: 8.8 mg/dL (ref 8.4–10.5)
CO2: 26 mmol/L (ref 19–32)
CREATININE: 0.85 mg/dL (ref 0.50–1.10)
Chloride: 103 mmol/L (ref 96–112)
GFR calc Af Amer: 67 mL/min — ABNORMAL LOW (ref >90)
GFR calc non Af Amer: 58 mL/min — ABNORMAL LOW (ref >90)
Glucose, Bld: 124 mg/dL — ABNORMAL HIGH (ref 70–99)
Potassium: 4.1 mmol/L (ref 3.5–5.1)
Sodium: 139 mmol/L (ref 135–145)

## 2014-12-19 LAB — URINE CULTURE: Colony Count: >=100000

## 2014-12-19 LAB — HEMOGLOBIN A1C
Hgb A1c MFr Bld: 7 % — ABNORMAL HIGH (ref 4.8–5.6)
MEAN PLASMA GLUCOSE: 154 mg/dL

## 2014-12-19 LAB — PROTIME-INR
INR: 3.45 — ABNORMAL HIGH (ref 0.00–1.49)
Prothrombin Time: 35 seconds — ABNORMAL HIGH (ref 11.6–15.2)

## 2014-12-19 MED ORDER — OXYCODONE HCL 5 MG PO TABS
10.0000 mg | ORAL_TABLET | ORAL | Status: DC | PRN
  Administered 2014-12-19 – 2014-12-22 (×3): 10 mg via ORAL
  Filled 2014-12-19 (×3): qty 2

## 2014-12-19 MED ORDER — POLYVINYL ALCOHOL 1.4 % OP SOLN
1.0000 [drp] | OPHTHALMIC | Status: DC | PRN
  Administered 2014-12-19 – 2014-12-23 (×2): 1 [drp] via OPHTHALMIC
  Filled 2014-12-19: qty 15

## 2014-12-19 MED ORDER — IPRATROPIUM-ALBUTEROL 0.5-2.5 (3) MG/3ML IN SOLN
3.0000 mL | Freq: Four times a day (QID) | RESPIRATORY_TRACT | Status: DC
  Administered 2014-12-19 – 2014-12-21 (×10): 3 mL via RESPIRATORY_TRACT
  Filled 2014-12-19 (×11): qty 3

## 2014-12-19 MED ORDER — FERROUS SULFATE 325 (65 FE) MG PO TABS
325.0000 mg | ORAL_TABLET | Freq: Every day | ORAL | Status: DC
  Administered 2014-12-19 – 2014-12-23 (×5): 325 mg via ORAL
  Filled 2014-12-19 (×6): qty 1

## 2014-12-19 MED ORDER — TRAZODONE HCL 50 MG PO TABS
50.0000 mg | ORAL_TABLET | Freq: Every day | ORAL | Status: DC
  Administered 2014-12-19: 50 mg via ORAL
  Filled 2014-12-19 (×2): qty 1

## 2014-12-19 NOTE — Progress Notes (Signed)
Patient awake most of night, unable to get comfortable, even with interventions. Patients sitter remain at bedside, assisted with ADL's. Patient remains on three liters of oxygen per nasal canula. Dyspnea with exertion.

## 2014-12-19 NOTE — Clinical Documentation Improvement (Signed)
Supporting Information: Patient with a history of diastolic heart failure per 4/05 progress notes.   Labs: BNP: 4/05:  328.8. 4/06:  691.4.    Possible Clinical Condition: . Document acuity --Acute --Chronic --Acute on Chronic . Document type --Diastolic --Systolic --Combined systolic and diastolic . Due to or associated with --Cardiac or other surgery --Hypertension --Valvular disease --Rheumatic heart disease Endocarditis (valvitis) Pericarditis Myocarditis --Other (specify)     Thank Sherian Maroon Documentation Specialist [phone] [email or web address]

## 2014-12-19 NOTE — Progress Notes (Addendum)
Speech Language Pathology Treatment: Dysphagia  Patient Details Name: [name] MRN: [id number] DOB: 06-Sep-1920 Today's Date: 12/19/2014 Time: 0254-2706 SLP Time Calculation (min) (ACUTE ONLY): 15 min  Assessment / Plan / Recommendation Clinical Impression  Pt moved from ICU to 3W today, appears fatigued. Caregivers report pt did not sleep last night and was given anxiety medicine earlier today which has helped her.   Dyspnea noted at baseline - again caregivers and pt report this to be norm.  Pt requested water - SLP provided small single cup boluses of water to pt with caregiver assist.  Caregiver requested no small ice in drinks as she reports pt will choke.     Re-educated pt/caregivers/nephew to aspiration precautions given multifactorial factors for risk including GERD, paraesophageal hernia, dyspnea.    Caregivers report good tolerance of po diet without coughing over the last few days with the pt "eating what she wants."  SLP to sign off as pt managing chronic dysphagia and all education completed.    HPI HPI: 81 yo female adm to [facility] with sepsis secondary to aspiration pna.  PMH + for afib, admits for dyspnea, large hiatal hernia *(paraesophageal), reflux.  Pt resides at ALF and hopes to return to this level when dc.  Per admit note, pt found to be coughing in her room by caregiver followed by vomiting.  CXR showed right base ATX or infiltrate.  Pt takes reflux medications.  Swallow eval ordered. Caregiver admits to pt required heimlich maneuver x1 on meat.  No weight loss nor recent pnas.  Last year pneumonia around the same time.     Pertinent Vitals Pain Assessment: No/denies pain (eye pain, right - nurse tech made aware at end of session)  SLP Plan  All goals met    Recommendations Diet recommendations: Dysphagia 3 (mechanical soft);Thin liquid (continue diet due to known h/o esophageal dysphagia and respiratory status, pt and caregivers agree) Medication Administration:  Whole meds with puree (follow with liquids) Supervision: Trained caregiver to feed patient Compensations: Slow rate;Small sips/bites (stop intake if short of breath) Postural Changes and/or Swallow Maneuvers: Upright 30-60 min after meal;Seated upright 90 degrees (strict reflux precautions)              Oral Care Recommendations: Oral care BID Follow up Recommendations: None Plan: All goals met    [place], [place] [facility] SLP [phone]   SLP phoned service response and spoke to kitchen staff reporting need for pt to have ground meat.  SLP was assured this would occur.

## 2014-12-19 NOTE — Progress Notes (Signed)
ANTICOAGULATION CONSULT NOTE - Follow Up  Pharmacy Consult for warfarin Indication: Atrial Fibrilation   Patient Measurements: Height: 5\' 3"  (160 cm) Weight: 180 lb 12.4 oz (82 kg) IBW/kg (Calculated) : 52.4   Vital Signs: Temp: 98.8 F (37.1 C) (04/07 0400) Temp Source: Oral (04/07 0400) BP: 161/84 mmHg (04/07 0600) Pulse Rate: 108 (04/07 0600)  Labs:  Recent Labs  12/17/14 0656 12/18/14 0325 12/19/14 0330  HGB 8.6* 7.8* 7.8*  HCT 30.6* 27.4* 27.6*  PLT 221 180 205  LABPROT 25.7* 30.4* 35.0*  INR 2.32* 2.88* 3.45*  CREATININE 1.08 0.80 0.85    Estimated Creatinine Clearance: 42.8 mL/min (by C-G formula based on Cr of 0.85).   Assessment: 81 year old female with past medical history significant for hypertension, arthritis, A. fib on Coumadin, anemia, hypothyroidism, chronic UTIs, presenting to the ED from SNF for shortness of breath after vomiting.  Admitted with ARF and sepsis 2/2 likely aspiration pneumonia.  Pharmacy consulted to dose warfarin.   Home warfarin dose 3mg  daily  INR supratherapeutic. INR continued to increase to 3.45 today despite providing lower dose yesterday.  Increase likely secondary to acute illness, vomiting.  Now, Levaquin started last night and will effect INR.  Hgb 7.8, Plts 205. No bleeding/complications reported.  Monitor CBC.  Patient is currently NPO.  Drug-drug interactions with warfarin, which may result in an increased INR (Flagyl 4/5-4/6, Levaquin 4/6-)  Goal of Therapy:  INR 2-3   Plan:  1.  Hold warfarin dose today. 2.  Daily INR.   Hershal Coria, PharmD, BCPS Pager: [phone] 12/19/2014 8:14 AM

## 2014-12-19 NOTE — Progress Notes (Addendum)
Progress Note   KYNSLIE RINGLE EHM:[id number] DOB: 08/25/1920 DOA: 12/17/2014 PCP: Abigail Miyamoto, MD   Brief Narrative:   [name] is an 81 y.o. female with a PMH of hypertension, arthritis, atrial fibrillation on warfarin, anemia and hypothyroidism who was admitted 12/17/14 from her SNF with respiratory distress after vomiting. On arrival, the patient was hypoxic with a saturation of 78% on room air.  Chest x-ray showed worsening right lung infiltration.  Assessment/Plan:   Principal Problem:   Sepsis secondary to aspiration pneumonia and acute respiratory failure with hypoxia - Being monitored in the SDU. - Initially placed on aztreonam, Flagyl and vancomycin. Antibiotics narrowed to Levaquin 12/18/14. - Continue oxygen support. - Seen by speech therapist 12/18/14. Diet advance to mechanical soft/thin liquids with strict aspiration precautions. - Follow-up blood cultures.  Active Problems:   Iron deficiency anemia - Stools Hemoccult negative. - Anemia panel consistent with iron deficiency anemia. - Start iron supplement.    Hyperglycemia - Follow-up hemoglobin A1c.    Atrial fibrillation - Continue warfarin for anticoagulation. - Continue Cardizem for rate control.    Acute on chronic Diastolic HF (heart failure) - Monitor fluid volume status. Required 20 mg of IV Lasix last night after she became dyspneic and chest x-ray showed pulmonary edema. - Continue oral Lasix. IV fluids discontinued.    Hypertension - Continue Cardizem and Mavik. Continue Lasix.    UTI (lower urinary tract infection) - Follow-up urine cultures. Currently on Levaquin.    Hypothyroidism - Continue oral Synthroid.    DVT Prophylaxis - Anticoagulated with Coumadin.  Code Status: DNR Family Communication: Caregiver updated at bedside. Disposition Plan: From [facility], has 24 hour caregivers, to return there when stable.   IV Access:    Peripheral IV   Procedures  and diagnostic studies:   Dg Chest Port 1 View 12/17/2014: Shallow inspiration with elevation of right hemidiaphragm and right lung base atelectasis or infiltration. Changes are stable since prior study.    Medical Consultants:    None.  Anti-Infectives:    Vancomycin 12/17/14---> 12/18/14  Aztreonam 12/17/14---> 12/18/14  Flagyl 12/17/14---> 12/18/14  Levaquin 12/18/14--->  Subjective:   [name] says she has had a lot of back pain.  Had an episode of respiratory distress yesterday.  She seems to be a bit confused.  RN reports she sat up in chair yesterday.  Has not slept all night per caregiver at the bedside.  Respiratory status much improved, no complaints of dyspnea  Objective:    Filed Vitals:   12/19/14 0101 12/19/14 0200 12/19/14 0400 12/19/14 0600  BP:  145/77 145/79 161/84  Pulse:  99 107 108  Temp:   98.8 F (37.1 C)   TempSrc:   Oral   Resp:  18 29 17   Height:      Weight:      SpO2: 92% 92% 92% 91%    Intake/Output Summary (Last 24 hours) at 12/19/14 0708 Last data filed at 12/18/14 1800  Gross per 24 hour  Intake    775 ml  Output    500 ml  Net    275 ml    Exam: Gen:  NAD Cardiovascular:  HSIR, No M/R/G Respiratory:  Lungs clear today. Gastrointestinal:  Abdomen soft, NT/ND, + BS Extremities:  No C/E/C, petechial rash (caregiver says chronic and from "surface phlebitis")   Data Reviewed:    Labs: Basic Metabolic Panel:  Recent Labs Lab 12/17/14 0656 12/18/14 0325 12/19/14 0330  NA 138 142 139  K 3.8 4.2 4.1  CL 98 107 103  CO2 28 27 26   GLUCOSE 211* 168* 124*  BUN 14 18 19   CREATININE 1.08 0.80 0.85  CALCIUM 8.5 8.6 8.8   GFR Estimated Creatinine Clearance: 42.8 mL/min (by C-G formula based on Cr of 0.85). Liver Function Tests:  Recent Labs Lab 12/17/14 0656  AST 23  ALT 13  ALKPHOS 97  BILITOT 0.6  PROT 6.6  ALBUMIN 3.8   Coagulation profile  Recent Labs Lab 12/17/14 0656 12/18/14 0325 12/19/14 0330  INR  2.32* 2.88* 3.45*    CBC:  Recent Labs Lab 12/17/14 0656 12/18/14 0325 12/19/14 0330  WBC 10.9* 14.4* 13.5*  NEUTROABS 8.8*  --   --   HGB 8.6* 7.8* 7.8*  HCT 30.6* 27.4* 27.6*  MCV 79.9 80.4 80.7  PLT 221 180 205   BNP (last 3 results)  Recent Labs  02/08/14 1230  PROBNP 2218.0*   Hgb A1c: No results for input(s): HGBA1C in the last 72 hours.  Anemia work up:  Recent Labs  12/18/14 0740  VITAMINB12 >2000*  FOLATE >20.0  FERRITIN 18  TIBC Not calculated due to Iron <10.  IRON <10*  RETICCTPCT 1.7   Sepsis Labs:  Recent Labs Lab 12/17/14 0656 12/17/14 0706 12/17/14 1045 12/18/14 0325 12/19/14 0330  WBC 10.9*  --   --  14.4* 13.5*  LATICACIDVEN  --  2.88* 2.88*  --   --    Microbiology Recent Results (from the past 240 hour(s))  Blood culture (routine x 2)     Status: None (Preliminary result)   Collection Time: 12/17/14  6:50 AM  Result Value Ref Range Status   Specimen Description BLOOD LEFT ANTECUBITAL  Final   Special Requests BOTTLES DRAWN AEROBIC AND ANAEROBIC 5ML  Final   Culture   Final    GRAM NEGATIVE RODS Note: Gram Stain Report Called to,Read Back By and Verified With: CHRISTINE SIMON AT 10:10 P.M. ON 12/17/2014 [facility] Performed at [facility]    Report Status PENDING  Incomplete  Blood culture (routine x 2)     Status: None (Preliminary result)   Collection Time: 12/17/14  6:55 AM  Result Value Ref Range Status   Specimen Description BLOOD RIGHT ANTECUBITAL  Final   Special Requests BOTTLES DRAWN AEROBIC AND ANAEROBIC 5ML  Final   Culture   Final    GRAM NEGATIVE RODS Note: Gram Stain Report Called to,Read Back By and Verified With: CHRISTINE SIMON AT 10:10 PM.M ON 12/17/2014 [facility] Performed at [facility]    Report Status PENDING  Incomplete  Urine culture     Status: None (Preliminary result)   Collection Time: 12/17/14  7:14 AM  Result Value Ref Range Status   Specimen Description URINE, CATHETERIZED  Final    Special Requests NONE  Final   Colony Count   Final    >=100,000 COLONIES/ML Performed at [facility]    Culture   Final    ESCHERICHIA COLI Performed at [facility]    Report Status PENDING  Incomplete  MRSA PCR Screening     Status: None   Collection Time: 12/17/14  5:31 PM  Result Value Ref Range Status   MRSA by PCR NEGATIVE NEGATIVE Final    Comment:        The GeneXpert MRSA Assay (FDA approved for NASAL specimens only), is one component of a comprehensive MRSA colonization surveillance program. It is not intended  to diagnose MRSA infection nor to guide or monitor treatment for MRSA infections.      Medications:   . cholecalciferol  1,000 Units Oral Daily  . diltiazem  180 mg Oral Daily  . fluticasone  2 spray Each Nare Daily  . furosemide  20 mg Oral Daily  . ipratropium-albuterol  3 mL Nebulization QID  . levofloxacin  750 mg Oral Q48H  . levothyroxine  37.5 mcg Oral QAC breakfast  . loratadine  10 mg Oral Daily  . pantoprazole  40 mg Oral Daily  . sertraline  12.5 mg Oral Daily  . sodium chloride  3 mL Intravenous Q12H  . trandolapril  1 mg Oral Daily  . Warfarin - Pharmacist Dosing Inpatient   Does not apply q1800   Continuous Infusions:    Time spent: 35 minutes with > 50% of time discussing current diagnostic test results, clinical impression and plan of care.    LOS: 2 days   Keltin Baird  [facility] Pager [phone]. If unable to reach me by pager, please call my cell phone at [phone].  *Please refer to [email or web address], password [email or web address] to get updated schedule on who will round on this patient, as hospitalists switch teams weekly. If 7PM-7AM, please contact night-coverage at [email or web address], password [email or web address] for any overnight needs.  12/19/2014, 7:08 AM

## 2014-12-20 DIAGNOSIS — R531 Weakness: Secondary | ICD-10-CM

## 2014-12-20 DIAGNOSIS — I5033 Acute on chronic diastolic (congestive) heart failure: Secondary | ICD-10-CM

## 2014-12-20 LAB — CULTURE, BLOOD (ROUTINE X 2)

## 2014-12-20 LAB — PROTIME-INR
INR: 3.39 — ABNORMAL HIGH (ref 0.00–1.49)
Prothrombin Time: 34.5 seconds — ABNORMAL HIGH (ref 11.6–15.2)

## 2014-12-20 MED ORDER — FUROSEMIDE 10 MG/ML IJ SOLN
20.0000 mg | Freq: Once | INTRAMUSCULAR | Status: AC
  Administered 2014-12-20: 20 mg via INTRAVENOUS
  Filled 2014-12-20: qty 2

## 2014-12-20 MED ORDER — POLYETHYLENE GLYCOL 3350 17 G PO PACK
17.0000 g | PACK | Freq: Every day | ORAL | Status: DC
  Administered 2014-12-20 – 2014-12-23 (×4): 17 g via ORAL
  Filled 2014-12-20 (×4): qty 1

## 2014-12-20 MED ORDER — TRAZODONE 25 MG HALF TABLET
25.0000 mg | ORAL_TABLET | Freq: Every day | ORAL | Status: DC
  Filled 2014-12-20 (×2): qty 1

## 2014-12-20 NOTE — Progress Notes (Signed)
ANTICOAGULATION CONSULT NOTE - Follow Up  Pharmacy Consult for warfarin Indication: Atrial Fibrilation   Patient Measurements: Height: 5\' 3"  (160 cm) Weight: 180 lb 12.4 oz (82 kg) IBW/kg (Calculated) : 52.4   Vital Signs: Temp: 99 F (37.2 C) (04/08 0536) Temp Source: Oral (04/08 0536) BP: 127/63 mmHg (04/08 0536) Pulse Rate: 99 (04/08 0536)  Labs:  Recent Labs  12/18/14 0325 12/19/14 0330 12/20/14 0352  HGB 7.8* 7.8*  --   HCT 27.4* 27.6*  --   PLT 180 205  --   LABPROT 30.4* 35.0* 34.5*  INR 2.88* 3.45* 3.39*  CREATININE 0.80 0.85  --     Estimated Creatinine Clearance: 42.8 mL/min (by C-G formula based on Cr of 0.85).   Assessment: 81 year old female with past medical history significant for hypertension, arthritis, A. fib on Coumadin, anemia, hypothyroidism, chronic UTIs, presenting to the ED from SNF for shortness of breath after vomiting.  Admitted with ARF and sepsis 2/2 likely aspiration pneumonia.  Pharmacy consulted to dose warfarin.   Home warfarin dose 3mg  daily  INR continues to be supratherapeutic - despite reduced dose 4/6 and no dose 4/7  Increase likely secondary to acute illness, vomiting.  Now, Levaquin started 4/6 and can effect  INR.  Hgb 7.8, Plts 205. No bleeding/complications reported.  Monitor CBC.  Patient is currently NPO.  Drug-drug interactions with warfarin, which may result in an increased INR (Flagyl 4/5-4/6,  Levaquin 4/6-)  Goal of Therapy:  INR 2-3   Plan:  1.  Hold warfarin dose again today. 2.  Daily INR.   Adrian Saran, PharmD, BCPS Pager [phone] 12/20/2014 11:02 AM

## 2014-12-20 NOTE — Progress Notes (Addendum)
Progress Note   [name] IOX:[id number] DOB: September 07, 1920 DOA: 12/17/2014 PCP: Abigail Miyamoto, MD   Brief Narrative:   [name] is an 81 y.o. female with a PMH of hypertension, arthritis, atrial fibrillation on warfarin, anemia and hypothyroidism who was admitted 12/17/14 from her SNF with respiratory distress after vomiting. On arrival, the patient was hypoxic with a saturation of 78% on room air.  Chest x-ray showed worsening right lung infiltration.  Assessment/Plan:   Principal Problem:   Sepsis secondary to aspiration pneumonia and acute respiratory failure with hypoxia - Initially placed on aztreonam, Flagyl and vancomycin. Antibiotics narrowed to Levaquin 12/18/14. - Continue oxygen support.  Wean as tolerated. - Seen by speech therapist 12/18/14. Diet advance to mechanical soft/thin liquids with strict aspiration precautions. - Follow-up blood cultures.  Active Problems:   Weakness - PT following.    Iron deficiency anemia - Stools Hemoccult negative. - Anemia panel consistent with iron deficiency anemia. - Start iron supplement. - Consider GI evaluation as an outpatient, if deemed appropriate by PCP given advanced age.   - Maybe worth stopping coumadin for, if persistently a problem.    Hyperglycemia / new onset diabetes - Hemoglobin A1c 7%. - Findings consistent with new onset diabetes. Would not treat aggressively given advanced age.    Atrial fibrillation - Continue warfarin for anticoagulation. - Continue Cardizem for rate control.  Will give lasix x 1 again today due to increased WOB.    Acute on chronic Diastolic HF (heart failure) - Monitor fluid volume status.   - Continue oral Lasix. IV fluids discontinued. - Weight up (? Accuracy), I/O - 400cc. - Will give lasix x 1 again today due to increased WOB.    Hypertension - Continue Cardizem and Mavik. Continue Lasix.    Escherichia coli UTI (lower urinary tract infection) - Cultures  positive for Escherichia coli, Levaquin sensitive.     Hypothyroidism - Continue oral Synthroid.    DVT Prophylaxis - Anticoagulated with Coumadin.  Code Status: DNR Family Communication: Caregiver and niece updated at bedside. Disposition Plan: From [facility], has 24 hour caregivers, to return there when stable.   IV Access:    Peripheral IV   Procedures and diagnostic studies:   Dg Chest Port 1 View 12/17/2014: Shallow inspiration with elevation of right hemidiaphragm and right lung base atelectasis or infiltration. Changes are stable since prior study.    Medical Consultants:    None.  Anti-Infectives:    Vancomycin 12/17/14---> 12/18/14  Aztreonam 12/17/14---> 12/18/14  Flagyl 12/17/14---> 12/18/14  Levaquin 12/18/14--->  Subjective:   [place] is sitting up in the chair, napping.  Slept well last night after being given Trazodone.  No complaints of dyspnea.  Has not moved bowels.   Objective:    Filed Vitals:   12/19/14 1616 12/19/14 1922 12/19/14 2236 12/20/14 0536  BP:   133/68 127/63  Pulse:   101 99  Temp:   98.6 F (37 C) 99 F (37.2 C)  TempSrc:   Oral Oral  Resp:   16 18  Height:      Weight:      SpO2: 95% 91% 97% 98%    Intake/Output Summary (Last 24 hours) at 12/20/14 0746 Last data filed at 12/20/14 0537  Gross per 24 hour  Intake      0 ml  Output    400 ml  Net   -400 ml    Exam: Gen:  NAD, sleepy, sitting  up in chair Cardiovascular:  HSIR, No M/R/G Respiratory:  Lungs with scattered rales Gastrointestinal:  Abdomen soft, NT/ND, + BS Extremities:  No C/E/C, petechial rash unchanged, chronic   Data Reviewed:    Labs: Basic Metabolic Panel:  Recent Labs Lab 12/17/14 0656 12/18/14 0325 12/19/14 0330  NA 138 142 139  K 3.8 4.2 4.1  CL 98 107 103  CO2 28 27 26   GLUCOSE 211* 168* 124*  BUN 14 18 19   CREATININE 1.08 0.80 0.85  CALCIUM 8.5 8.6 8.8   GFR Estimated Creatinine Clearance: 42.8 mL/min (by C-G  formula based on Cr of 0.85). Liver Function Tests:  Recent Labs Lab 12/17/14 0656  AST 23  ALT 13  ALKPHOS 97  BILITOT 0.6  PROT 6.6  ALBUMIN 3.8   Coagulation profile  Recent Labs Lab 12/17/14 0656 12/18/14 0325 12/19/14 0330 12/20/14 0352  INR 2.32* 2.88* 3.45* 3.39*    CBC:  Recent Labs Lab 12/17/14 0656 12/18/14 0325 12/19/14 0330  WBC 10.9* 14.4* 13.5*  NEUTROABS 8.8*  --   --   HGB 8.6* 7.8* 7.8*  HCT 30.6* 27.4* 27.6*  MCV 79.9 80.4 80.7  PLT 221 180 205   BNP (last 3 results)  Recent Labs  02/08/14 1230  PROBNP 2218.0*   Hgb A1c:  Recent Labs  12/18/14 0740  HGBA1C 7.0*    Anemia work up:  Recent Labs  12/18/14 0740  VITAMINB12 >2000*  FOLATE >20.0  FERRITIN 18  TIBC Not calculated due to Iron <10.  IRON <10*  RETICCTPCT 1.7   Sepsis Labs:  Recent Labs Lab 12/17/14 0656 12/17/14 0706 12/17/14 1045 12/18/14 0325 12/19/14 0330  WBC 10.9*  --   --  14.4* 13.5*  LATICACIDVEN  --  2.88* 2.88*  --   --    Microbiology Recent Results (from the past 240 hour(s))  Blood culture (routine x 2)     Status: None (Preliminary result)   Collection Time: 12/17/14  6:50 AM  Result Value Ref Range Status   Specimen Description BLOOD LEFT ANTECUBITAL  Final   Special Requests BOTTLES DRAWN AEROBIC AND ANAEROBIC 5ML  Final   Culture   Final    GRAM NEGATIVE RODS Note: Gram Stain Report Called to,Read Back By and Verified With: CHRISTINE SIMON AT 10:10 P.M. ON 12/17/2014 [facility] Performed at [facility]    Report Status PENDING  Incomplete  Blood culture (routine x 2)     Status: None (Preliminary result)   Collection Time: 12/17/14  6:55 AM  Result Value Ref Range Status   Specimen Description BLOOD RIGHT ANTECUBITAL  Final   Special Requests BOTTLES DRAWN AEROBIC AND ANAEROBIC 5ML  Final   Culture   Final    GRAM NEGATIVE RODS Note: Gram Stain Report Called to,Read Back By and Verified With: CHRISTINE SIMON AT 10:10 PM.M ON  12/17/2014 [facility] Performed at [facility]    Report Status PENDING  Incomplete  Urine culture     Status: None   Collection Time: 12/17/14  7:14 AM  Result Value Ref Range Status   Specimen Description URINE, CATHETERIZED  Final   Special Requests NONE  Final   Colony Count   Final    >=100,000 COLONIES/ML Performed at [facility]    Culture   Final    ESCHERICHIA COLI Performed at [facility]    Report Status 12/19/2014 FINAL  Final   Organism ID, Bacteria ESCHERICHIA COLI  Final  Susceptibility   Escherichia coli - MIC*    AMPICILLIN <=2 SENSITIVE Sensitive     CEFAZOLIN <=4 SENSITIVE Sensitive     CEFTRIAXONE <=1 SENSITIVE Sensitive     CIPROFLOXACIN <=0.25 SENSITIVE Sensitive     GENTAMICIN <=1 SENSITIVE Sensitive     LEVOFLOXACIN <=0.12 SENSITIVE Sensitive     NITROFURANTOIN <=16 SENSITIVE Sensitive     TOBRAMYCIN <=1 SENSITIVE Sensitive     TRIMETH/SULFA <=20 SENSITIVE Sensitive     PIP/TAZO <=4 SENSITIVE Sensitive     * ESCHERICHIA COLI  MRSA PCR Screening     Status: None   Collection Time: 12/17/14  5:31 PM  Result Value Ref Range Status   MRSA by PCR NEGATIVE NEGATIVE Final    Comment:        The GeneXpert MRSA Assay (FDA approved for NASAL specimens only), is one component of a comprehensive MRSA colonization surveillance program. It is not intended to diagnose MRSA infection nor to guide or monitor treatment for MRSA infections.      Medications:   . cholecalciferol  1,000 Units Oral Daily  . diltiazem  180 mg Oral Daily  . ferrous sulfate  325 mg Oral Q breakfast  . fluticasone  2 spray Each Nare Daily  . furosemide  20 mg Oral Daily  . ipratropium-albuterol  3 mL Nebulization QID  . levofloxacin  750 mg Oral Q48H  . levothyroxine  37.5 mcg Oral QAC breakfast  . loratadine  10 mg Oral Daily  . pantoprazole  40 mg Oral Daily  . sertraline  12.5 mg Oral Daily  . sodium chloride  3 mL Intravenous Q12H  .  trandolapril  1 mg Oral Daily  . traZODone  50 mg Oral QHS  . Warfarin - Pharmacist Dosing Inpatient   Does not apply q1800   Continuous Infusions:    Time spent: 25 minutes.    LOS: 3 days   [facility] Pager [phone]. If unable to reach me by pager, please call my cell phone at [phone].  *Please refer to [email or web address], password [email or web address] to get updated schedule on who will round on this patient, as hospitalists switch teams weekly. If 7PM-7AM, please contact night-coverage at [email or web address], password [email or web address] for any overnight needs.  12/20/2014, 7:46 AM

## 2014-12-20 NOTE — Progress Notes (Signed)
Physical Therapy Treatment Patient Details Name: [name] MRN: [id number] DOB: Nov 16, 1919 Today's Date: 12/20/2014    History of Present Illness Admitted with dx of sepsis.  Hx of a-fib and pacemaker    PT Comments    Pt continues to have very heavy posterior lean in sitting and standing. This significantly limits mobility. +3 assist to pivot to recliner.   Follow Up Recommendations  SNF     Equipment Recommendations  None recommended by PT    Recommendations for Other Services OT consult     Precautions / Restrictions Precautions Precautions: Fall Restrictions Weight Bearing Restrictions: No    Mobility  Bed Mobility Overal bed mobility: Needs Assistance;+2 for physical assistance Bed Mobility: Supine to Sit     Supine to sit: +2 for physical assistance;Total assist     General bed mobility comments: cues for sequence   Transfers Overall transfer level: Needs assistance Equipment used: Rolling walker (2 wheeled) Transfers: Sit to/from [employer] Transfers Sit to Stand: +2 physical assistance;+2 safety/equipment;Total assist Stand pivot transfers: +2 physical assistance;+2 safety/equipment;Total assist       General transfer comment: Pt with heavy post lean  - required max assist of 3 to prevent fall bkwd and rotate to sit in chair  Ambulation/Gait                 Stairs            Wheelchair Mobility    Modified Rankin (Stroke Patients Only)       Balance Overall balance assessment: Needs assistance Sitting-balance support: Feet supported;Bilateral upper extremity supported Sitting balance-Leahy Scale: Zero Sitting balance - Comments: very heavy posterior lean despite max verbal and manual cues, required max to total assist for sitting balance, sat on EOB x 8 minutes Postural control: Posterior lean                          Cognition Arousal/Alertness: Awake/alert Behavior During Therapy: WFL for tasks  assessed/performed Overall Cognitive Status: Within Functional Limits for tasks assessed       Memory: Decreased short-term memory              Exercises      General Comments        Pertinent Vitals/Pain Pain Assessment: No/denies pain    Home Living                      Prior Function            PT Goals (current goals can now be found in the care plan section) Acute Rehab PT Goals Patient Stated Goal: Home PT Goal Formulation: With patient Time For Goal Achievement: 01/01/15 Potential to Achieve Goals: Fair Progress towards PT goals: Not progressing toward goals - comment (posterior lean limiting progress)    Frequency  Min 3X/week    PT Plan Current plan remains appropriate    Co-evaluation             End of Session Equipment Utilized During Treatment: Gait belt;Oxygen Activity Tolerance: Patient limited by fatigue Patient left: in chair;with call bell/phone within reach;with family/visitor present;with nursing/sitter in room     Time: 1025-1044 PT Time Calculation (min) (ACUTE ONLY): 19 min  Charges:  $Therapeutic Activity: 8-22 mins                    G Codes:      Philomena Doheny 12/20/2014,  11:46 AM [phone]

## 2014-12-20 NOTE — Progress Notes (Addendum)
CSW continuing to follow.  Pt admitted from [place] where pt has 24 hour caregivers, but will likely need short term rehab at [facility] before returning to her independent living with 24 hour caregivers.  CSW met with pt, pt niece, Reino Bellis, and pt caregiver at bedside. CSW introduced self and explained role. Pt caregiver expressed that she was grateful for CSW visit as pt caregiver and pt family had been discussing with pt about plans for discharge.   CSW inquired with pt and pt family about pt ability to ambulate at baseline. Pt niece reports that pt could ambulate with a walker and stand by assistance in her apartment. Pt does not wear O2 at baseline either. CSW discussed with pt and pt niece and pt caregiver that during PT evaluation that pt required two people for assistance. Pt caregiver discussed that pt is requiring more assist. CSW discussed with pt recommendation for rehab at [facility] before returning to independent living with 24 hour caregivers. Pt initially hesitant and CSW validated pt fears surrounding short term rehab stay. Pt acknowledges that she is a lot weaker from being in the hospital. CSW provided education surrounding the benefits of rehab in order for pt to continue to feel better and be able to work with physical therapy to get stronger. Pt niece and pt caregiver supportive of this conversation. Pt states that she is agreeable to short term rehab at [facility]. Pt discussed that if she does get stronger in the hospital then she may want to go home, but at this time agreeable to plans for rehab before return to home. Pt agreeable for CSW to notify [facility] in order to ensure bed availability when pt medically ready for discharge.  CSW spoke with MD who reports that pt is not yet medically ready for discharge and likely will not be medically ready for discharge until Monday.  CSW updated pt FL2 and sent updated clinicals to [facility]. CSW contacted facility and confirmed with facility that they could accept pt for short term rehab when medically ready for discharge.   CSW updated pt HCPOA, Marny Lowenstein. Patient HCPOA can be reached at [phone] or [phone].  CSW to continue to follow to provide support and assist with pt disposition needs.   Alison Murray, MSW, [place] [employer] [phone]

## 2014-12-21 ENCOUNTER — Encounter (HOSPITAL_COMMUNITY): Payer: Self-pay | Admitting: Internal Medicine

## 2014-12-21 DIAGNOSIS — N183 Chronic kidney disease, stage 3 unspecified: Secondary | ICD-10-CM | POA: Diagnosis present

## 2014-12-21 DIAGNOSIS — E119 Type 2 diabetes mellitus without complications: Secondary | ICD-10-CM

## 2014-12-21 DIAGNOSIS — K5900 Constipation, unspecified: Secondary | ICD-10-CM | POA: Diagnosis present

## 2014-12-21 DIAGNOSIS — R531 Weakness: Secondary | ICD-10-CM

## 2014-12-21 LAB — BASIC METABOLIC PANEL
Anion gap: 8 (ref 5–15)
BUN: 14 mg/dL (ref 6–23)
CHLORIDE: 100 mmol/L (ref 96–112)
CO2: 33 mmol/L — ABNORMAL HIGH (ref 19–32)
CREATININE: 0.83 mg/dL (ref 0.50–1.10)
Calcium: 8.5 mg/dL (ref 8.4–10.5)
GFR, EST AFRICAN AMERICAN: 69 mL/min — AB (ref >90)
GFR, EST NON AFRICAN AMERICAN: 59 mL/min — AB (ref >90)
Glucose, Bld: 110 mg/dL — ABNORMAL HIGH (ref 70–99)
Potassium: 3.5 mmol/L (ref 3.5–5.1)
SODIUM: 141 mmol/L (ref 135–145)

## 2014-12-21 LAB — PROTIME-INR
INR: 2.2 — ABNORMAL HIGH (ref 0.00–1.49)
Prothrombin Time: 24.6 seconds — ABNORMAL HIGH (ref 11.6–15.2)

## 2014-12-21 LAB — CBC
HEMATOCRIT: 29.8 % — AB (ref 36.0–46.0)
Hemoglobin: 8.4 g/dL — ABNORMAL LOW (ref 12.0–15.0)
MCH: 22.9 pg — ABNORMAL LOW (ref 26.0–34.0)
MCHC: 28.2 g/dL — AB (ref 30.0–36.0)
MCV: 81.2 fL (ref 78.0–100.0)
Platelets: 175 10*3/uL (ref 150–400)
RBC: 3.67 MIL/uL — AB (ref 3.87–5.11)
RDW: 18.6 % — ABNORMAL HIGH (ref 11.5–15.5)
WBC: 5.5 10*3/uL (ref 4.0–10.5)

## 2014-12-21 MED ORDER — FUROSEMIDE 40 MG PO TABS
40.0000 mg | ORAL_TABLET | Freq: Every day | ORAL | Status: DC
  Administered 2014-12-21 – 2014-12-23 (×3): 40 mg via ORAL
  Filled 2014-12-21 (×3): qty 1

## 2014-12-21 MED ORDER — POTASSIUM CHLORIDE CRYS ER 20 MEQ PO TBCR
20.0000 meq | EXTENDED_RELEASE_TABLET | Freq: Two times a day (BID) | ORAL | Status: DC
  Administered 2014-12-21 – 2014-12-23 (×5): 20 meq via ORAL
  Filled 2014-12-21 (×6): qty 1

## 2014-12-21 MED ORDER — IPRATROPIUM-ALBUTEROL 0.5-2.5 (3) MG/3ML IN SOLN
3.0000 mL | Freq: Two times a day (BID) | RESPIRATORY_TRACT | Status: DC
  Administered 2014-12-21 – 2014-12-23 (×4): 3 mL via RESPIRATORY_TRACT
  Filled 2014-12-21 (×4): qty 3

## 2014-12-21 MED ORDER — TRAZODONE HCL 50 MG PO TABS
25.0000 mg | ORAL_TABLET | Freq: Every evening | ORAL | Status: DC | PRN
  Administered 2014-12-22: 25 mg via ORAL
  Filled 2014-12-21: qty 1

## 2014-12-21 MED ORDER — WARFARIN SODIUM 1 MG PO TABS
1.5000 mg | ORAL_TABLET | Freq: Once | ORAL | Status: AC
  Administered 2014-12-21: 1.5 mg via ORAL
  Filled 2014-12-21: qty 1

## 2014-12-21 MED ORDER — BISACODYL 10 MG RE SUPP
10.0000 mg | Freq: Once | RECTAL | Status: AC
  Administered 2014-12-21: 10 mg via RECTAL
  Filled 2014-12-21: qty 1

## 2014-12-21 NOTE — Progress Notes (Signed)
Progress Note   CIANNA KASPARIAN AVW:[id number] DOB: [birth year] DOA: 12/17/2014 PCP: Abigail Miyamoto, MD   Brief Narrative:   [name] is an 81 y.o. female with a PMH of hypertension, arthritis, atrial fibrillation on warfarin, anemia and hypothyroidism who was admitted 12/17/14 from her SNF with respiratory distress after vomiting. On arrival, the patient was hypoxic with a saturation of 78% on room air.  Chest x-ray showed worsening right lung infiltration.  Assessment/Plan:   Principal Problem:   Sepsis secondary to aspiration pneumonia and acute respiratory failure with hypoxia - Initially placed on aztreonam, Flagyl and vancomycin. Antibiotics narrowed to Levaquin 12/18/14. - Continue oxygen support.  Wean as tolerated. - Seen by speech therapist 12/18/14. Diet advanced to mechanical soft/thin liquids with strict aspiration precautions. - Blood cultures remain negative to date. - Leukocytosis resolved.  Active Problems:   Constipation - Started on daily MiraLAX yesterday. Unable to take due to lethargy. - Give Dulcolax suppository today.    Stage III chronic kidney disease - Creatinine improved over admission values, and now stable.    Weakness - PT following.  SNF recommended, no equipment needs.    Iron deficiency anemia - Stools Hemoccult negative. - Anemia panel consistent with iron deficiency anemia. - Continue iron supplement. Hemoglobin stable and slightly improved over admission values. - Consider GI evaluation as an outpatient, if deemed appropriate by PCP given advanced age.   - Maybe worth stopping coumadin for, if persistently a problem.    Hyperglycemia / new onset diabetes - Hemoglobin A1c 7%. - Findings consistent with new onset diabetes. Would not treat aggressively given advanced age.    Atrial fibrillation - Continue warfarin for anticoagulation. - Continue Cardizem for rate control.      Acute on chronic Diastolic HF (heart failure) -  Monitor fluid volume status.   - Continue oral Lasix, but increase dose to 40 mg daily (add KCL supplement).  - Monitor daily weight and strict I/O.    Hypertension - Continue Cardizem and Mavik. Continue Lasix.    Escherichia coli UTI (lower urinary tract infection) - Cultures positive for Escherichia coli, Levaquin sensitive.     Hypothyroidism - Continue oral Synthroid.    DVT Prophylaxis - Anticoagulated with Coumadin.  Code Status: DNR Family Communication: Caregiver updated at bedside. Disposition Plan: From [facility], has 24 hour caregivers, to return there when stable.   IV Access:    Peripheral IV   Procedures and diagnostic studies:   Dg Chest Port 1 View 12/17/2014: Shallow inspiration with elevation of right hemidiaphragm and right lung base atelectasis or infiltration. Changes are stable since prior study.    Medical Consultants:    None.  Anti-Infectives:    Vancomycin 12/17/14---> 12/18/14  Aztreonam 12/17/14---> 12/18/14  Flagyl 12/17/14---> 12/18/14  Levaquin 12/18/14--->  Subjective:   [name] is awake and alert this morning, ate a good breakfast.  Although she denies dyspnea, she has a slight upper airway wheeze and ongoing increased WOB.  Has a non-productive cough.  No BM yet.  Objective:    Filed Vitals:   12/20/14 1215 12/20/14 1937 12/20/14 2151 12/21/14 0558  BP:   142/73 144/79  Pulse:  96 91 86  Temp:   98 F (36.7 C) 98.2 F (36.8 C)  TempSrc:   Oral Oral  Resp:  18 20 20   Height:      Weight:      SpO2: 96% 95% 100% 96%    Intake/Output  Summary (Last 24 hours) at 12/21/14 0719 Last data filed at 12/20/14 2209  Gross per 24 hour  Intake    120 ml  Output    250 ml  Net   -130 ml    Exam: Gen:  NAD Cardiovascular:  HSIR, No M/R/G Respiratory:  Lungs with scattered rales, upper airway wheeze Gastrointestinal:  Abdomen soft, NT/ND, + BS Extremities:  No C/E/C, petechial rash unchanged, chronic   Data  Reviewed:    Labs: Basic Metabolic Panel:  Recent Labs Lab 12/17/14 0656 12/18/14 0325 12/19/14 0330 12/21/14 0455  NA 138 142 139 141  K 3.8 4.2 4.1 3.5  CL 98 107 103 100  CO2 28 27 26  33*  GLUCOSE 211* 168* 124* 110*  BUN 14 18 19 14   CREATININE 1.08 0.80 0.85 0.83  CALCIUM 8.5 8.6 8.8 8.5   GFR Estimated Creatinine Clearance: 43.8 mL/min (by C-G formula based on Cr of 0.83). Liver Function Tests:  Recent Labs Lab 12/17/14 0656  AST 23  ALT 13  ALKPHOS 97  BILITOT 0.6  PROT 6.6  ALBUMIN 3.8   Coagulation profile  Recent Labs Lab 12/17/14 0656 12/18/14 0325 12/19/14 0330 12/20/14 0352 12/21/14 0455  INR 2.32* 2.88* 3.45* 3.39* 2.20*    CBC:  Recent Labs Lab 12/17/14 0656 12/18/14 0325 12/19/14 0330 12/21/14 0455  WBC 10.9* 14.4* 13.5* 5.5  NEUTROABS 8.8*  --   --   --   HGB 8.6* 7.8* 7.8* 8.4*  HCT 30.6* 27.4* 27.6* 29.8*  MCV 79.9 80.4 80.7 81.2  PLT 221 180 205 175   BNP (last 3 results)  Recent Labs  02/08/14 1230  PROBNP 2218.0*   Hgb A1c:  Recent Labs  12/18/14 0740  HGBA1C 7.0*    Anemia work up:  Recent Labs  12/18/14 0740  VITAMINB12 >2000*  FOLATE >20.0  FERRITIN 18  TIBC Not calculated due to Iron <10.  IRON <10*  RETICCTPCT 1.7   Sepsis Labs:  Recent Labs Lab 12/17/14 0656 12/17/14 0706 12/17/14 1045 12/18/14 0325 12/19/14 0330 12/21/14 0455  WBC 10.9*  --   --  14.4* 13.5* 5.5  LATICACIDVEN  --  2.88* 2.88*  --   --   --    Microbiology Recent Results (from the past 240 hour(s))  Blood culture (routine x 2)     Status: None   Collection Time: 12/17/14  6:50 AM  Result Value Ref Range Status   Specimen Description BLOOD LEFT ANTECUBITAL  Final   Special Requests BOTTLES DRAWN AEROBIC AND ANAEROBIC 5ML  Final   Culture   Final    ESCHERICHIA COLI Note: SUSCEPTIBILITIES PERFORMED ON PREVIOUS CULTURE WITHIN THE LAST 5 DAYS. Note: Gram Stain Report Called to,Read Back By and Verified With:  CHRISTINE SIMON AT 10:10 P.M. ON 12/17/2014 [facility] Performed at [facility]    Report Status 12/20/2014 FINAL  Final  Blood culture (routine x 2)     Status: None   Collection Time: 12/17/14  6:55 AM  Result Value Ref Range Status   Specimen Description BLOOD RIGHT ANTECUBITAL  Final   Special Requests BOTTLES DRAWN AEROBIC AND ANAEROBIC 5ML  Final   Culture   Final    ESCHERICHIA COLI Note: Gram Stain Report Called to,Read Back By and Verified With: CHRISTINE SIMON AT 10:10 PM.M ON 12/17/2014 [facility] Performed at [facility]    Report Status 12/20/2014 FINAL  Final   Organism ID, Bacteria ESCHERICHIA COLI  Final  Susceptibility   Escherichia coli - MIC*    AMPICILLIN <=2 SENSITIVE Sensitive     AMPICILLIN/SULBACTAM <=2 SENSITIVE Sensitive     CEFAZOLIN <=4 SENSITIVE Sensitive     CEFEPIME <=1 SENSITIVE Sensitive     CEFTAZIDIME <=1 SENSITIVE Sensitive     CEFTRIAXONE <=1 SENSITIVE Sensitive     CIPROFLOXACIN <=0.25 SENSITIVE Sensitive     GENTAMICIN <=1 SENSITIVE Sensitive     IMIPENEM <=0.25 SENSITIVE Sensitive     PIP/TAZO <=4 SENSITIVE Sensitive     TOBRAMYCIN <=1 SENSITIVE Sensitive     TRIMETH/SULFA <=20 SENSITIVE Sensitive     * ESCHERICHIA COLI  Urine culture     Status: None   Collection Time: 12/17/14  7:14 AM  Result Value Ref Range Status   Specimen Description URINE, CATHETERIZED  Final   Special Requests NONE  Final   Colony Count   Final    >=100,000 COLONIES/ML Performed at [facility]    Culture   Final    ESCHERICHIA COLI Performed at [facility]    Report Status 12/19/2014 FINAL  Final   Organism ID, Bacteria ESCHERICHIA COLI  Final      Susceptibility   Escherichia coli - MIC*    AMPICILLIN <=2 SENSITIVE Sensitive     CEFAZOLIN <=4 SENSITIVE Sensitive     CEFTRIAXONE <=1 SENSITIVE Sensitive     CIPROFLOXACIN <=0.25 SENSITIVE Sensitive     GENTAMICIN <=1 SENSITIVE Sensitive     LEVOFLOXACIN <=0.12 SENSITIVE  Sensitive     NITROFURANTOIN <=16 SENSITIVE Sensitive     TOBRAMYCIN <=1 SENSITIVE Sensitive     TRIMETH/SULFA <=20 SENSITIVE Sensitive     PIP/TAZO <=4 SENSITIVE Sensitive     * ESCHERICHIA COLI  MRSA PCR Screening     Status: None   Collection Time: 12/17/14  5:31 PM  Result Value Ref Range Status   MRSA by PCR NEGATIVE NEGATIVE Final    Comment:        The GeneXpert MRSA Assay (FDA approved for NASAL specimens only), is one component of a comprehensive MRSA colonization surveillance program. It is not intended to diagnose MRSA infection nor to guide or monitor treatment for MRSA infections.      Medications:   . cholecalciferol  1,000 Units Oral Daily  . diltiazem  180 mg Oral Daily  . ferrous sulfate  325 mg Oral Q breakfast  . fluticasone  2 spray Each Nare Daily  . furosemide  20 mg Oral Daily  . ipratropium-albuterol  3 mL Nebulization QID  . levofloxacin  750 mg Oral Q48H  . levothyroxine  37.5 mcg Oral QAC breakfast  . loratadine  10 mg Oral Daily  . pantoprazole  40 mg Oral Daily  . polyethylene glycol  17 g Oral Daily  . sertraline  12.5 mg Oral Daily  . sodium chloride  3 mL Intravenous Q12H  . trandolapril  1 mg Oral Daily  . traZODone  25 mg Oral QHS  . Warfarin - Pharmacist Dosing Inpatient   Does not apply q1800   Continuous Infusions:    Time spent: 25 minutes.    LOS: 4 days   [facility] Pager [phone]. If unable to reach me by pager, please call my cell phone at [phone].  *Please refer to [email or web address], password [email or web address] to get updated schedule on who will round on this patient, as hospitalists switch teams weekly. If 7PM-7AM, please contact night-coverage at [email or web address], password [email or web address] for any overnight needs.  12/21/2014, 7:19 AM

## 2014-12-21 NOTE — Progress Notes (Signed)
ANTICOAGULATION CONSULT NOTE - Follow Up  Pharmacy Consult for warfarin Indication: Atrial Fibrilation   Patient Measurements: Height: 5\' 3"  (160 cm) Weight: 180 lb 12.4 oz (82 kg) IBW/kg (Calculated) : 52.4   Vital Signs: Temp: 98.2 F (36.8 C) (04/09 0558) Temp Source: Oral (04/09 0558) BP: 144/79 mmHg (04/09 0558) Pulse Rate: 86 (04/09 0558)  Labs:  Recent Labs  12/19/14 0330 12/20/14 0352 12/21/14 0455  HGB 7.8*  --  8.4*  HCT 27.6*  --  29.8*  PLT 205  --  175  LABPROT 35.0* 34.5* 24.6*  INR 3.45* 3.39* 2.20*  CREATININE 0.85  --  0.83    Estimated Creatinine Clearance: 43.8 mL/min (by C-G formula based on Cr of 0.83).   Assessment: 81 year old female with past medical history significant for hypertension, arthritis, A. fib on Coumadin, anemia, hypothyroidism, chronic UTIs, presenting to the ED from SNF for shortness of breath after vomiting.  Admitted with ARF and sepsis 2/2 likely aspiration pneumonia.  Pharmacy consulted to dose warfarin.   Home warfarin dose 3mg  daily  INR now within therapeutic range after being supratherapeutic which was likely secondary to acute illness, vomiting.  Drug interactions: Flagy 4/5-4/6, Levaquin started 4/6 and both can effect INR.  Hgb improved 8.4, Plts stable. No bleeding/complications reported.   Goal of Therapy:  INR 2-3   Plan:  1. Resume small dose warfarin today - 1.5mg  2. Daily INR.  Peggyann Juba, PharmD, BCPS Pager: [phone]  12/21/2014 10:55 AM

## 2014-12-21 NOTE — Progress Notes (Signed)
Chaplain requested to see [name] by family members. She was in the process of being cleaned up. Chaplain will try to return at a later time.  Sallee Lange. Endrit Gittins, [place]

## 2014-12-22 DIAGNOSIS — R7881 Bacteremia: Secondary | ICD-10-CM

## 2014-12-22 DIAGNOSIS — A4151 Sepsis due to Escherichia coli [E. coli]: Principal | ICD-10-CM

## 2014-12-22 DIAGNOSIS — R112 Nausea with vomiting, unspecified: Secondary | ICD-10-CM | POA: Diagnosis present

## 2014-12-22 DIAGNOSIS — J9600 Acute respiratory failure, unspecified whether with hypoxia or hypercapnia: Secondary | ICD-10-CM

## 2014-12-22 LAB — BASIC METABOLIC PANEL
Anion gap: 5 (ref 5–15)
BUN: 16 mg/dL (ref 6–23)
CALCIUM: 8.1 mg/dL — AB (ref 8.4–10.5)
CHLORIDE: 104 mmol/L (ref 96–112)
CO2: 35 mmol/L — ABNORMAL HIGH (ref 19–32)
CREATININE: 0.79 mg/dL (ref 0.50–1.10)
GFR calc non Af Amer: 70 mL/min — ABNORMAL LOW (ref >90)
GFR, EST AFRICAN AMERICAN: 81 mL/min — AB (ref >90)
GLUCOSE: 122 mg/dL — AB (ref 70–99)
Potassium: 3.9 mmol/L (ref 3.5–5.1)
Sodium: 144 mmol/L (ref 135–145)

## 2014-12-22 LAB — PROTIME-INR
INR: 2.05 — ABNORMAL HIGH (ref 0.00–1.49)
PROTHROMBIN TIME: 23.3 s — AB (ref 11.6–15.2)

## 2014-12-22 MED ORDER — FUROSEMIDE 20 MG PO TABS
40.0000 mg | ORAL_TABLET | Freq: Every day | ORAL | Status: DC
Start: 2014-12-22 — End: 2015-03-30

## 2014-12-22 MED ORDER — WARFARIN SODIUM 3 MG PO TABS
3.0000 mg | ORAL_TABLET | Freq: Once | ORAL | Status: AC
  Administered 2014-12-22: 3 mg via ORAL
  Filled 2014-12-22: qty 1

## 2014-12-22 MED ORDER — LEVOFLOXACIN 750 MG PO TABS: 750.0000 mg | ORAL_TABLET | ORAL | Status: DC

## 2014-12-22 MED ORDER — FERROUS SULFATE 325 (65 FE) MG PO TABS: 325.0000 mg | ORAL_TABLET | Freq: Every day | ORAL | Status: AC

## 2014-12-22 MED ORDER — POTASSIUM CHLORIDE CRYS ER 20 MEQ PO TBCR: 20.0000 meq | EXTENDED_RELEASE_TABLET | Freq: Two times a day (BID) | ORAL | Status: DC

## 2014-12-22 MED ORDER — ALBUTEROL SULFATE (2.5 MG/3ML) 0.083% IN NEBU: 2.5000 mg | INHALATION_SOLUTION | RESPIRATORY_TRACT | Status: AC | PRN

## 2014-12-22 NOTE — Discharge Summary (Signed)
Physician Discharge Summary  [name] JKK:[id number] DOB: 1919/09/19 DOA: 12/17/2014  PCP: Abigail Miyamoto, MD  Admit date: 12/17/2014 Discharge date: 12/22/2014   Recommendations for Outpatient Follow-Up:   1. Recommend close outpatient follow-up of INR while on antibiotics. Risk for supratherapeutic INR while on Levaquin. 2. Consider outpatient GI evaluation or discontinuing Coumadin for persistent anemia with fecal occult positivity.   Discharge Diagnosis:   Principal Problem:    E. coli sepsis Active Problems:    Atrial fibrillation    Hypertension    Acute respiratory failure with hypoxia    UTI (lower urinary tract infection)    Hypothyroidism    Hyperglycemia    Anemia, iron deficiency    Acute on chronic diastolic CHF (congestive heart failure)    Weakness    New onset type 2 diabetes mellitus    Stage III chronic kidney disease    Constipation    Nausea and vomiting with suspected aspiration    E coli bacteremia    Acute respiratory failure   Discharge Condition: Improved.  Diet recommendation: Low sodium, heart healthy.    History of Present Illness:   [name] is an 81 y.o. female with a PMH of hypertension, arthritis, atrial fibrillation on warfarin, anemia and hypothyroidism who was admitted 12/17/14 from her SNF with respiratory distress after vomiting. On arrival, the patient was hypoxic with a saturation of 78% on room air. Chest x-ray showed worsening right lung infiltration & chronic right hemidiaphragm elevation. Urine and blood cultures grew E. Coli.   Hospital Course by Problem:   Principal Problem:  Sepsis secondary to Escherichia coli bacteremia - Initially placed on aztreonam, Flagyl and vancomycin. Antibiotics narrowed to Levaquin 12/18/14. - Initially felt to be from aspiration, but both blood and urine cultures growing Escherichia coli, so source likely urinary. - Leukocytosis resolved. - Discharge home  on Levaquin every other day for 4 additional days.  Active Problems:  Acute respiratory failure - Has chronic right hemidiaphragm elevation. Likely contributory. We'll set up home oxygen at discharge.   Nausea and vomiting / suspected aspiration - Initially treated with aztreonam, Flagyl and vancomycin. - Provided with supplemental oxygen. - Seen by speech therapist 12/18/14. Diet advanced to mechanical soft with thin liquids/aspiration precautions.    Constipation - Started on daily MiraLAX yesterday. Unable to take due to lethargy. - Given Dulcolax suppository 12/21/14 with good effect.   Stage III chronic kidney disease - Creatinine improved over admission values, and now stable.   Weakness - PT following. SNF recommended, no equipment needs. Patient declines and prefers to go back to her ALF where she has 24-hour caregivers.   Iron deficiency anemia - Stools Hemoccult negative. - Anemia panel consistent with iron deficiency anemia. - Continue iron supplement. Hemoglobin stable and slightly improved over admission values. - Consider GI evaluation as an outpatient, if deemed appropriate by PCP given advanced age.  - Maybe worth stopping coumadin for, if persistently a problem.   Hyperglycemia / new onset diabetes - Hemoglobin A1c 7%. - Findings consistent with new onset diabetes. Would not treat aggressively given advanced age.   Atrial fibrillation - Continue warfarin for anticoagulation. Monitor INR closely while on Levaquin. - Continue Cardizem for rate control.    Acute on chronic Diastolic HF (heart failure) - Lasix increased to 40 mg daily.   Hypertension - Continue Cardizem and Mavik. Continue Lasix.   Escherichia coli UTI (lower urinary tract infection) - Cultures positive for Escherichia coli, Levaquin sensitive.  Hypothyroidism - Continue oral Synthroid.    Medical Consultants:    None.   Discharge Exam:   Filed Vitals:   12/22/14  1250  BP: 163/99  Pulse: 98  Temp: 98.2 F (36.8 C)  Resp: 20   Filed Vitals:   12/21/14 2220 12/22/14 0625 12/22/14 0834 12/22/14 1250  BP: 154/82 133/71  163/99  Pulse: 86 82  98  Temp: 97.7 F (36.5 C) 97.8 F (36.6 C)  98.2 F (36.8 C)  TempSrc: Oral Oral  Oral  Resp: 20 20  20   Height:      Weight:  76.749 kg (169 lb 3.2 oz)    SpO2: 96% 95% 98% 98%    Gen:  NAD Cardiovascular:  HSIR, No M/R/G Respiratory: Lungs CTAB Gastrointestinal: Abdomen soft, NT/ND with normal active bowel sounds. Extremities: Petechial rash unchanged.   The results of significant diagnostics from this hospitalization (including imaging, microbiology, ancillary and laboratory) are listed below for reference.     Procedures and Diagnostic Studies:   Dg Chest Port 1 View 12/17/2014: Shallow inspiration with elevation of right hemidiaphragm and right lung base atelectasis or infiltration. Changes are stable since prior study.   Labs:   Basic Metabolic Panel:  Recent Labs Lab 12/17/14 0656 12/18/14 0325 12/19/14 0330 12/21/14 0455 12/22/14 0440  NA 138 142 139 141 144  K 3.8 4.2 4.1 3.5 3.9  CL 98 107 103 100 104  CO2 28 27 26  33* 35*  GLUCOSE 211* 168* 124* 110* 122*  BUN 14 18 19 14 16   CREATININE 1.08 0.80 0.85 0.83 0.79  CALCIUM 8.5 8.6 8.8 8.5 8.1*   GFR Estimated Creatinine Clearance: 44 mL/min (by C-G formula based on Cr of 0.79). Liver Function Tests:  Recent Labs Lab 12/17/14 0656  AST 23  ALT 13  ALKPHOS 97  BILITOT 0.6  PROT 6.6  ALBUMIN 3.8   Coagulation profile  Recent Labs Lab 12/18/14 0325 12/19/14 0330 12/20/14 0352 12/21/14 0455 12/22/14 0440  INR 2.88* 3.45* 3.39* 2.20* 2.05*    CBC:  Recent Labs Lab 12/17/14 0656 12/18/14 0325 12/19/14 0330 12/21/14 0455  WBC 10.9* 14.4* 13.5* 5.5  NEUTROABS 8.8*  --   --   --   HGB 8.6* 7.8* 7.8* 8.4*  HCT 30.6* 27.4* 27.6* 29.8*  MCV 79.9 80.4 80.7 81.2  PLT 221 180 205 175    Microbiology Recent Results (from the past 240 hour(s))  Blood culture (routine x 2)     Status: None   Collection Time: 12/17/14  6:50 AM  Result Value Ref Range Status   Specimen Description BLOOD LEFT ANTECUBITAL  Final   Special Requests BOTTLES DRAWN AEROBIC AND ANAEROBIC 5ML  Final   Culture   Final    ESCHERICHIA COLI Note: SUSCEPTIBILITIES PERFORMED ON PREVIOUS CULTURE WITHIN THE LAST 5 DAYS. Note: Gram Stain Report Called to,Read Back By and Verified With: CHRISTINE SIMON AT 10:10 P.M. ON 12/17/2014 [facility] Performed at [facility]    Report Status 12/20/2014 FINAL  Final  Blood culture (routine x 2)     Status: None   Collection Time: 12/17/14  6:55 AM  Result Value Ref Range Status   Specimen Description BLOOD RIGHT ANTECUBITAL  Final   Special Requests BOTTLES DRAWN AEROBIC AND ANAEROBIC 5ML  Final   Culture   Final    ESCHERICHIA COLI Note: Gram Stain Report Called to,Read Back By and Verified With: CHRISTINE SIMON AT 10:10 PM.M ON 12/17/2014 [facility] Performed at [facility] [facility]    Report Status 12/20/2014 FINAL  Final   Organism ID, Bacteria ESCHERICHIA COLI  Final      Susceptibility   Escherichia coli - MIC*    AMPICILLIN <=2 SENSITIVE Sensitive     AMPICILLIN/SULBACTAM <=2 SENSITIVE Sensitive     CEFAZOLIN <=4 SENSITIVE Sensitive     CEFEPIME <=1 SENSITIVE Sensitive     CEFTAZIDIME <=1 SENSITIVE Sensitive     CEFTRIAXONE <=1 SENSITIVE Sensitive     CIPROFLOXACIN <=0.25 SENSITIVE Sensitive     GENTAMICIN <=1 SENSITIVE Sensitive     IMIPENEM <=0.25 SENSITIVE Sensitive     PIP/TAZO <=4 SENSITIVE Sensitive     TOBRAMYCIN <=1 SENSITIVE Sensitive     TRIMETH/SULFA <=20 SENSITIVE Sensitive     * ESCHERICHIA COLI  Urine culture     Status: None   Collection Time: 12/17/14  7:14 AM  Result Value Ref Range Status   Specimen Description URINE, CATHETERIZED  Final   Special Requests NONE  Final   Colony Count   Final    >=100,000 COLONIES/ML Performed at  [facility]    Culture   Final    ESCHERICHIA COLI Performed at [facility]    Report Status 12/19/2014 FINAL  Final   Organism ID, Bacteria ESCHERICHIA COLI  Final      Susceptibility   Escherichia coli - MIC*    AMPICILLIN <=2 SENSITIVE Sensitive     CEFAZOLIN <=4 SENSITIVE Sensitive     CEFTRIAXONE <=1 SENSITIVE Sensitive     CIPROFLOXACIN <=0.25 SENSITIVE Sensitive     GENTAMICIN <=1 SENSITIVE Sensitive     LEVOFLOXACIN <=0.12 SENSITIVE Sensitive     NITROFURANTOIN <=16 SENSITIVE Sensitive     TOBRAMYCIN <=1 SENSITIVE Sensitive     TRIMETH/SULFA <=20 SENSITIVE Sensitive     PIP/TAZO <=4 SENSITIVE Sensitive     * ESCHERICHIA COLI  MRSA PCR Screening     Status: None   Collection Time: 12/17/14  5:31 PM  Result Value Ref Range Status   MRSA by PCR NEGATIVE NEGATIVE Final    Comment:        The GeneXpert MRSA Assay (FDA approved for NASAL specimens only), is one component of a comprehensive MRSA colonization surveillance program. It is not intended to diagnose MRSA infection nor to guide or monitor treatment for MRSA infections.      Discharge Instructions:   Discharge Instructions    Call MD for:  difficulty breathing, headache or visual disturbances    Complete by:  As directed      Call MD for:  extreme fatigue    Complete by:  As directed      Call MD for:  persistant dizziness or light-headedness    Complete by:  As directed      Call MD for:  temperature >100.4    Complete by:  As directed      Diet - low sodium heart healthy    Complete by:  As directed      Increase activity slowly    Complete by:  As directed             Medication List    TAKE these medications        albuterol 108 (90 BASE) MCG/ACT inhaler  Commonly known as:  PROVENTIL HFA;VENTOLIN HFA  Inhale 1 puff into the lungs every 6 (six) hours as needed for wheezing or shortness of breath.     albuterol (2.5 MG/3ML) 0.083% nebulizer solution  Commonly  known as:   PROVENTIL  Take 3 mLs (2.5 mg total) by nebulization every 2 (two) hours as needed for wheezing.     CEREFOLIN NAC 6-90.314-2-600 MG Tabs  Take 1 tablet by mouth daily.     cholecalciferol 1000 UNITS tablet  Commonly known as:  VITAMIN D  Take 1,000 Units by mouth daily.     diltiazem 180 MG 24 hr capsule  Commonly known as:  CARDIZEM CD  Take 1 capsule by mouth daily.     famotidine 10 MG tablet  Commonly known as:  PEPCID  Take 10 mg by mouth daily as needed for heartburn.     ferrous sulfate 325 (65 FE) MG tablet  Take 1 tablet (325 mg total) by mouth daily with breakfast.     furosemide 20 MG tablet  Commonly known as:  LASIX  Take 2 tablets (40 mg total) by mouth daily.     ICAPS AREDS FORMULA PO  Take 1 capsule by mouth daily.     levofloxacin 750 MG tablet  Commonly known as:  LEVAQUIN  Take 1 tablet (750 mg total) by mouth every other day.     levothyroxine 75 MCG tablet  Commonly known as:  SYNTHROID, LEVOTHROID  Take 37.5 mcg by mouth daily.     loratadine 10 MG tablet  Commonly known as:  CLARITIN  Take 10 mg by mouth daily.     MUSCLE RUB 10-15 % Crea  Apply 1 application topically 3 (three) times daily as needed for muscle pain.     omeprazole 20 MG capsule  Commonly known as:  PRILOSEC  Take 20 mg by mouth daily.     Oxycodone HCl 10 MG Tabs  Take 10 mg by mouth every 8 (eight) hours as needed (pain).     potassium chloride SA 20 MEQ tablet  Commonly known as:  K-DUR,KLOR-CON  Take 1 tablet (20 mEq total) by mouth 2 (two) times daily.     ranitidine 150 MG tablet  Commonly known as:  ZANTAC  Take 150 mg by mouth 2 (two) times daily.     sertraline 25 MG tablet  Commonly known as:  ZOLOFT  Take 12.5 mg by mouth daily.     trandolapril 1 MG tablet  Commonly known as:  MAVIK  Take 1 mg by mouth daily.     warfarin 3 MG tablet  Commonly known as:  COUMADIN  Take 3 mg by mouth See admin instructions.          Time coordinating  discharge: 35 minutes.  Signed:  Janani Chamber  Pager [phone] [facility] 12/22/2014, 5:35 PM

## 2014-12-22 NOTE — Progress Notes (Signed)
CARE MANAGEMENT NOTE 12/22/2014  Patient:  [name], [name]   Account Number:  [id number]  Date Initiated:  12/18/2014  Documentation initiated by:  DAVIS,RHONDA  Subjective/Objective Assessment:   patient lives at skilled nursing facility, brought secondary to respiratory distress, a shunt caregiver at bedside, reports she woke up at night with patient in respiratory distress, as well noticed some vomiting, so they called ED, NAD pat     Action/Plan:   snf   Anticipated DC Date:  12/21/2014   Anticipated DC Plan:  [place]  In-house referral  Clinical Social Worker      DC Planning Services  NA      [facility] Choice  HOME HEALTH   Choice offered to / List presented to:  C-4 Adult Children   DME arranged  NA      DME agency  NA     [place] arranged  HH-2 PT  HH-1 RN      [place].   Status of service:  Completed, signed off Medicare Important Message given?  YES (If response is "NO", the following Medicare IM given date fields will be blank) Date Medicare IM given:  12/22/2014 Medicare IM given by:  [facility] Date Additional Medicare IM given:   Additional Medicare IM given by:    Discharge Disposition:  [place]  Per UR Regulation:  Reviewed for med. necessity/level of care/duration of stay  If discussed at [place] of Stay Meetings, dates discussed:    Comments:  12/22/2014 1600 NCM spoke to pt's niece, Miguel Rota [phone]. States plan is to go back to [facility] to [place] apt. Pt has 24 hour per day personal care assistant with [facility]. She has RW, 3n1, wheelchair and therapeutic bed at home. Offered choice to niece and provided HHA list. Requested [facility] for [facility]. Contacted [facility] for [facility] for scheduled dc home tomorrow. Will need HH orders for PT/RN with [facility]. Jonnie Finner RN CCM Case Mgmt phone [phone]  December 18, 2014/Rhonda L. Rosana Hoes, RN, BSN, CCM. Case Management [place] [phone] No discharge needs present of time of review.

## 2014-12-22 NOTE — Progress Notes (Signed)
Progress Note   SOPHIAGRACE BENBROOK WUX:[id number] DOB: 1920-05-08 DOA: 12/17/2014 PCP: Abigail Miyamoto, MD   Brief Narrative:   [name] is an 81 y.o. female with a PMH of hypertension, arthritis, atrial fibrillation on warfarin, anemia and hypothyroidism who was admitted 12/17/14 from her SNF with respiratory distress after vomiting. On arrival, the patient was hypoxic with a saturation of 78% on room air.  Chest x-ray showed worsening right lung infiltration & chronic right hemidiaphragm elevation.  Urine and blood cultures grew E. Coli.    Assessment/Plan:   Principal Problem:   Sepsis secondary to Escherichia coli bacteremia - Initially placed on aztreonam, Flagyl and vancomycin. Antibiotics narrowed to Levaquin 12/18/14. - Initially felt to be from aspiration, but both blood and urine cultures growing Escherichia coli, so source likely urinary. - Leukocytosis resolved.  Active Problems:   Acute respiratory failure - Has chronic right hemidiaphragm elevation.  Likely contributory.  May need oxygen at discharge.    Nausea and vomiting / suspected aspiration - Initially treated with aztreonam, Flagyl and vancomycin. - Provided with supplemental oxygen. - Seen by speech therapist 12/18/14. Diet advanced to mechanical soft with thin liquids/aspiration precautions.     Constipation - Started on daily MiraLAX yesterday. Unable to take due to lethargy. - Given Dulcolax suppository 12/21/14 with good effect.    Stage III chronic kidney disease - Creatinine improved over admission values, and now stable.    Weakness - PT following.  SNF recommended, no equipment needs.    Iron deficiency anemia - Stools Hemoccult negative. - Anemia panel consistent with iron deficiency anemia. - Continue iron supplement. Hemoglobin stable and slightly improved over admission values. - Consider GI evaluation as an outpatient, if deemed appropriate by PCP given advanced age.   - Maybe worth  stopping coumadin for, if persistently a problem.    Hyperglycemia / new onset diabetes - Hemoglobin A1c 7%. - Findings consistent with new onset diabetes. Would not treat aggressively given advanced age.    Atrial fibrillation - Continue warfarin for anticoagulation. - Continue Cardizem for rate control.      Acute on chronic Diastolic HF (heart failure) - Monitor fluid volume status.   - Continue oral Lasix, but increase dose to 40 mg daily on 12/21/14 (added KCL supplement).  - Monitor daily weight and strict I/O.    Hypertension - Continue Cardizem and Mavik. Continue Lasix.    Escherichia coli UTI (lower urinary tract infection) - Cultures positive for Escherichia coli, Levaquin sensitive.     Hypothyroidism - Continue oral Synthroid.    DVT Prophylaxis - Anticoagulated with Coumadin.  Code Status: DNR Family Communication: Caregiver updated at bedside. Disposition Plan: From [facility], has 24 hour caregivers, to return there 12/23/14.   IV Access:    Peripheral IV   Procedures and diagnostic studies:   Dg Chest Port 1 View 12/17/2014: Shallow inspiration with elevation of right hemidiaphragm and right lung base atelectasis or infiltration. Changes are stable since prior study.    Medical Consultants:    None.  Anti-Infectives:    Vancomycin 12/17/14---> 12/18/14  Aztreonam 12/17/14---> 12/18/14  Flagyl 12/17/14---> 12/18/14  Levaquin 12/18/14--->  Subjective:   [place] sat up for her breakfast in the chair, ate all her breakfast.  Still with some labored breathing, but feels well overall.  Moved her bowels this morning.  Still has oxygen on, and will likely need to go home on it.  Objective:    Filed  Vitals:   12/21/14 2100 12/21/14 2220 12/22/14 0625 12/22/14 0834  BP:  154/82 133/71   Pulse:  86 82   Temp:  97.7 F (36.5 C) 97.8 F (36.6 C)   TempSrc:  Oral Oral   Resp:  20 20   Height:      Weight: 81.421 kg (179 lb 8 oz)  76.749 kg  (169 lb 3.2 oz)   SpO2:  96% 95% 98%    Intake/Output Summary (Last 24 hours) at 12/22/14 0853 Last data filed at 12/22/14 0000  Gross per 24 hour  Intake    603 ml  Output    700 ml  Net    -97 ml    Exam: Gen:  NAD Cardiovascular:  HSIR, No M/R/G Respiratory:  Lungs diminished but clear today Gastrointestinal:  Abdomen soft, NT/ND, + BS Extremities:  No C/E/C, petechial rash unchanged, chronic   Data Reviewed:    Labs: Basic Metabolic Panel:  Recent Labs Lab 12/17/14 0656 12/18/14 0325 12/19/14 0330 12/21/14 0455 12/22/14 0440  NA 138 142 139 141 144  K 3.8 4.2 4.1 3.5 3.9  CL 98 107 103 100 104  CO2 28 27 26  33* 35*  GLUCOSE 211* 168* 124* 110* 122*  BUN 14 18 19 14 16   CREATININE 1.08 0.80 0.85 0.83 0.79  CALCIUM 8.5 8.6 8.8 8.5 8.1*   GFR Estimated Creatinine Clearance: 44 mL/min (by C-G formula based on Cr of 0.79). Liver Function Tests:  Recent Labs Lab 12/17/14 0656  AST 23  ALT 13  ALKPHOS 97  BILITOT 0.6  PROT 6.6  ALBUMIN 3.8   Coagulation profile  Recent Labs Lab 12/18/14 0325 12/19/14 0330 12/20/14 0352 12/21/14 0455 12/22/14 0440  INR 2.88* 3.45* 3.39* 2.20* 2.05*    CBC:  Recent Labs Lab 12/17/14 0656 12/18/14 0325 12/19/14 0330 12/21/14 0455  WBC 10.9* 14.4* 13.5* 5.5  NEUTROABS 8.8*  --   --   --   HGB 8.6* 7.8* 7.8* 8.4*  HCT 30.6* 27.4* 27.6* 29.8*  MCV 79.9 80.4 80.7 81.2  PLT 221 180 205 175   BNP (last 3 results)  Recent Labs  02/08/14 1230  PROBNP 2218.0*   Sepsis Labs:  Recent Labs Lab 12/17/14 0656 12/17/14 0706 12/17/14 1045 12/18/14 0325 12/19/14 0330 12/21/14 0455  WBC 10.9*  --   --  14.4* 13.5* 5.5  LATICACIDVEN  --  2.88* 2.88*  --   --   --    Microbiology Recent Results (from the past 240 hour(s))  Blood culture (routine x 2)     Status: None   Collection Time: 12/17/14  6:50 AM  Result Value Ref Range Status   Specimen Description BLOOD LEFT ANTECUBITAL  Final   Special  Requests BOTTLES DRAWN AEROBIC AND ANAEROBIC 5ML  Final   Culture   Final    ESCHERICHIA COLI Note: SUSCEPTIBILITIES PERFORMED ON PREVIOUS CULTURE WITHIN THE LAST 5 DAYS. Note: Gram Stain Report Called to,Read Back By and Verified With: CHRISTINE SIMON AT 10:10 P.M. ON 12/17/2014 [facility] Performed at [facility]    Report Status 12/20/2014 FINAL  Final  Blood culture (routine x 2)     Status: None   Collection Time: 12/17/14  6:55 AM  Result Value Ref Range Status   Specimen Description BLOOD RIGHT ANTECUBITAL  Final   Special Requests BOTTLES DRAWN AEROBIC AND ANAEROBIC 5ML  Final   Culture   Final    ESCHERICHIA COLI Note: Gram Stain Report Called to,Read  Back By and Verified With: CHRISTINE SIMON AT 10:10 PM.M ON 12/17/2014 [facility] Performed at [facility]    Report Status 12/20/2014 FINAL  Final   Organism ID, Bacteria ESCHERICHIA COLI  Final      Susceptibility   Escherichia coli - MIC*    AMPICILLIN <=2 SENSITIVE Sensitive     AMPICILLIN/SULBACTAM <=2 SENSITIVE Sensitive     CEFAZOLIN <=4 SENSITIVE Sensitive     CEFEPIME <=1 SENSITIVE Sensitive     CEFTAZIDIME <=1 SENSITIVE Sensitive     CEFTRIAXONE <=1 SENSITIVE Sensitive     CIPROFLOXACIN <=0.25 SENSITIVE Sensitive     GENTAMICIN <=1 SENSITIVE Sensitive     IMIPENEM <=0.25 SENSITIVE Sensitive     PIP/TAZO <=4 SENSITIVE Sensitive     TOBRAMYCIN <=1 SENSITIVE Sensitive     TRIMETH/SULFA <=20 SENSITIVE Sensitive     * ESCHERICHIA COLI  Urine culture     Status: None   Collection Time: 12/17/14  7:14 AM  Result Value Ref Range Status   Specimen Description URINE, CATHETERIZED  Final   Special Requests NONE  Final   Colony Count   Final    >=100,000 COLONIES/ML Performed at [facility]    Culture   Final    ESCHERICHIA COLI Performed at [facility]    Report Status 12/19/2014 FINAL  Final   Organism ID, Bacteria ESCHERICHIA COLI  Final      Susceptibility   Escherichia coli - MIC*     AMPICILLIN <=2 SENSITIVE Sensitive     CEFAZOLIN <=4 SENSITIVE Sensitive     CEFTRIAXONE <=1 SENSITIVE Sensitive     CIPROFLOXACIN <=0.25 SENSITIVE Sensitive     GENTAMICIN <=1 SENSITIVE Sensitive     LEVOFLOXACIN <=0.12 SENSITIVE Sensitive     NITROFURANTOIN <=16 SENSITIVE Sensitive     TOBRAMYCIN <=1 SENSITIVE Sensitive     TRIMETH/SULFA <=20 SENSITIVE Sensitive     PIP/TAZO <=4 SENSITIVE Sensitive     * ESCHERICHIA COLI  MRSA PCR Screening     Status: None   Collection Time: 12/17/14  5:31 PM  Result Value Ref Range Status   MRSA by PCR NEGATIVE NEGATIVE Final    Comment:        The GeneXpert MRSA Assay (FDA approved for NASAL specimens only), is one component of a comprehensive MRSA colonization surveillance program. It is not intended to diagnose MRSA infection nor to guide or monitor treatment for MRSA infections.      Medications:   . cholecalciferol  1,000 Units Oral Daily  . diltiazem  180 mg Oral Daily  . ferrous sulfate  325 mg Oral Q breakfast  . fluticasone  2 spray Each Nare Daily  . furosemide  40 mg Oral Daily  . ipratropium-albuterol  3 mL Nebulization BID  . levofloxacin  750 mg Oral Q48H  . levothyroxine  37.5 mcg Oral QAC breakfast  . loratadine  10 mg Oral Daily  . pantoprazole  40 mg Oral Daily  . polyethylene glycol  17 g Oral Daily  . potassium chloride  20 mEq Oral BID  . sertraline  12.5 mg Oral Daily  . sodium chloride  3 mL Intravenous Q12H  . trandolapril  1 mg Oral Daily  . Warfarin - Pharmacist Dosing Inpatient   Does not apply q1800   Continuous Infusions:    Time spent: 25 minutes.    LOS: 5 days   [facility] Pager [phone]. If unable to reach me by pager, please call  my cell phone at [phone].  *Please refer to [email or web address], password [email or web address] to get updated schedule on who will round on this patient, as hospitalists switch teams weekly. If 7PM-7AM, please contact night-coverage at [email or web address], password  [email or web address] for any overnight needs.  12/22/2014, 8:53 AM

## 2014-12-22 NOTE — Progress Notes (Signed)
ANTICOAGULATION CONSULT NOTE - Follow Up  Pharmacy Consult for warfarin Indication: Atrial Fibrilation   Patient Measurements: Height: 5\' 3"  (160 cm) Weight: 169 lb 3.2 oz (76.749 kg) IBW/kg (Calculated) : 52.4   Vital Signs: Temp: 97.8 F (36.6 C) (04/10 0625) Temp Source: Oral (04/10 0625) BP: 133/71 mmHg (04/10 0625) Pulse Rate: 82 (04/10 0625)  Labs:  Recent Labs  12/20/14 0352 12/21/14 0455 12/22/14 0440  HGB  --  8.4*  --   HCT  --  29.8*  --   PLT  --  175  --   LABPROT 34.5* 24.6* 23.3*  INR 3.39* 2.20* 2.05*  CREATININE  --  0.83 0.79    Estimated Creatinine Clearance: 44 mL/min (by C-G formula based on Cr of 0.79).   Assessment: 81 year old female with past medical history significant for hypertension, arthritis, A. fib on Coumadin, anemia, hypothyroidism, chronic UTIs, presenting to the ED from SNF for shortness of breath after vomiting.  Admitted with ARF and sepsis 2/2 likely aspiration pneumonia.  Pharmacy consulted to dose warfarin.   Home warfarin dose 3mg  daily  INR 2.05 now within therapeutic range after being supratherapeutic which was likely secondary to acute illness, vomiting, drug interactions.  Drug interactions: Flagy 4/5-4/6, Levaquin started 4/6 and both can effect INR.  Hgb improved 8.4 yesterday, Plts stable. No bleeding/complications reported.   Goal of Therapy:  INR 2-3   Plan:   Warfarin 3mg  PO x 1 today  Daily PT/INR  Peggyann Juba, PharmD, BCPS Pager: [phone]  12/22/2014 9:51 AM

## 2014-12-22 NOTE — Progress Notes (Signed)
Follow up visit by chaplain - Ms [name] requested chaplain to pray for her healing and ability to return home. She has excellent family support and good support from her community of faith. Prayer prayed and spiritual counsel provided.  Sallee Lange. Jalaiyah Throgmorton, [place]

## 2014-12-23 DIAGNOSIS — K5909 Other constipation: Secondary | ICD-10-CM

## 2014-12-23 LAB — PROTIME-INR
INR: 2.17 — ABNORMAL HIGH (ref 0.00–1.49)
PROTHROMBIN TIME: 24.4 s — AB (ref 11.6–15.2)

## 2014-12-23 MED ORDER — WARFARIN SODIUM 3 MG PO TABS
3.0000 mg | ORAL_TABLET | Freq: Once | ORAL | Status: AC
  Administered 2014-12-23: 3 mg via ORAL
  Filled 2014-12-23: qty 1

## 2014-12-23 NOTE — Progress Notes (Signed)
Received report on pt to care for  Her until PTAR arrived, pt assessment unchanged, family present, PTAR arrived, took VS and is loading pt up to return to [facility].

## 2014-12-23 NOTE — Progress Notes (Signed)
SATURATION QUALIFICATIONS: (This note is used to comply with regulatory documentation for home oxygen)  Patient Saturations on Room Air at Rest = 89  Patient Saturations on Room Air while Ambulating = 87  Patient Saturations on 4 Liters of oxygen while Ambulating = 92  Please briefly explain why patient needs home oxygen: patient not able to maintain saturations without O2.

## 2014-12-23 NOTE — Progress Notes (Signed)
ANTICOAGULATION CONSULT NOTE - Follow Up  Pharmacy Consult for warfarin Indication: Atrial Fibrilation  Patient Measurements: Height: 5\' 3"  (160 cm) Weight: 170 lb 12.8 oz (77.474 kg) IBW/kg (Calculated) : 52.4  Vital Signs: Temp: 97.8 F (36.6 C) (04/11 0655) Temp Source: Oral (04/11 0655) BP: 129/71 mmHg (04/11 0655) Pulse Rate: 84 (04/11 0655)  Labs:  Recent Labs  12/21/14 0455 12/22/14 0440 12/23/14 0450  HGB 8.4*  --   --   HCT 29.8*  --   --   PLT 175  --   --   LABPROT 24.6* 23.3* 24.4*  INR 2.20* 2.05* 2.17*  CREATININE 0.83 0.79  --    Estimated Creatinine Clearance: 44.2 mL/min (by C-G formula based on Cr of 0.79).  Assessment: 81 year old female with past medical history significant for hypertension, arthritis, A. fib on Coumadin, anemia, hypothyroidism, chronic UTIs, presenting to the ED from SNF for shortness of breath after vomiting.  Admitted with ARF and sepsis 2/2 likely aspiration pneumonia.  Pharmacy consulted to dose warfarin.   Home warfarin dose 3mg  daily  INR 2.17 today, remains within therapeutic range after being supratherapeutic which was likely secondary to acute illness, vomiting, drug interactions.  Drug interactions: Flagy 4/5-4/6, Levaquin started 4/6 and both can effect INR.  Hgb 8.4 (4/9), Plts stable. No bleeding/complications reported.   Goal of Therapy:  INR 2-3   Plan:   Warfarin 3mg  PO x 1 today before discharge  Daily PT/INR  Minda Ditto PharmD Pager [phone] 12/23/2014, 1:14 PM

## 2014-12-23 NOTE — Progress Notes (Signed)
CSW continuing to follow.  CSW received notification from RN that pt and pt family have decided that pt will return to [facility] with private duty nurses at the home 24 hours a day.   CSW met with pt, pt niece and pt nephew, and pt caregiver at bedside. Pt confirmed that she wishes to return home to her independent living upon discharge. Pt niece, Harriett confirmed plan. Pt caregiver expressed that pt will need ambulance transport home.   CSW confirmed address for ambulance transport to home.   CSW notified [facility] that pt plans to return home to independent living and does not plan to come to rehab section of [facility].   CSW contacteed PTAR and arranged ambulance transport for pt to home.   No further social work needs identified at this time.  CSW signing off.   Alison Murray, MSW, [place] Work [phone]

## 2014-12-23 NOTE — Progress Notes (Signed)
Patient stable for discharge. Please refer to discharge summary done 12/22/2014. Leisa Lenz [facility] [phone]

## 2014-12-23 NOTE — Progress Notes (Signed)
Patient discharged to independent living facility with plan to continue receiving 24 hr care from private caretakers.  Discharge medications and instructions reviewed with patient's niece at bedside. Patient to be transported via [place].

## 2014-12-23 NOTE — Care Management Note (Signed)
12/23/14 Marney Doctor RN,BSN,NCM [phone] This CM was alerted by nursing that pt would need home 02 and neb machine at DC. Desaturation screen done by nursing. [place] DME rep contacted and given referral for home 02 and neb machine. Awaiting MD orders. No other DC needs noted.

## 2014-12-23 NOTE — Progress Notes (Signed)
Physical Therapy Treatment Patient Details Name: [name] MRN: [id number] DOB: May 24, 1920 Today's Date: 12/23/2014    History of Present Illness Admitted with dx of sepsis.  Hx of a-fib and pacemaker    PT Comments    Pt. Was in bed; attended to by personal healthcare assistant she stated that pt use lift chair at home; Pt transfered to EOB; Pt transfered OOB to chair; pt unable to ambulate;   Follow Up Recommendations  SNF     Equipment Recommendations  None recommended by PT    Recommendations for Other Services       Precautions / Restrictions      Mobility  Bed Mobility Overal bed mobility: Needs Assistance;+2 for physical assistance Bed Mobility: Supine to Sit     Supine to sit: +2 for physical assistance;Total assist     General bed mobility comments: cues for sequence   Transfers Overall transfer level: Needs assistance Equipment used: Rolling walker (2 wheeled) Transfers: Sit to/from [employer] Transfers Sit to Stand: +2 physical assistance;+2 safety/equipment;Total assist (pt 10% of assistance ) Stand pivot transfers: +2 physical assistance;+2 safety/equipment;Total assist (Pt 0%)       General transfer comment: EOB pt right lean; forward flex kyphotic posture   Ambulation/Gait                 Stairs            Wheelchair Mobility    Modified Rankin (Stroke Patients Only)       Balance                                    Cognition Arousal/Alertness: Awake/alert Behavior During Therapy: WFL for tasks assessed/performed Overall Cognitive Status: Within Functional Limits for tasks assessed       Memory: Decreased short-term memory              Exercises      General Comments        Pertinent Vitals/Pain Pain Assessment: No/denies pain    Home Living                      Prior Function            PT Goals (current goals can now be found in the care plan section)  Progress towards PT goals: Progressing toward goals    Frequency  Min 3X/week    PT Plan      Co-evaluation             End of Session Equipment Utilized During Treatment: Gait belt;Oxygen Activity Tolerance: Patient tolerated treatment well Patient left: in chair;with call bell/phone within reach;with family/visitor present;with nursing/sitter in room     Time: 3762-8315    Charges:   1 ta                    G Codes:      [place], PTA 12/23/2014, 12:03 PM   Reviewed and agree with above  Rica Koyanagi  PTA [facility]  Acute  Rehab Pager      [phone]

## 2015-01-22 ENCOUNTER — Encounter: Payer: Self-pay | Admitting: Podiatry

## 2015-01-22 ENCOUNTER — Ambulatory Visit (INDEPENDENT_AMBULATORY_CARE_PROVIDER_SITE_OTHER): Payer: Medicare Other | Admitting: Podiatry

## 2015-01-22 VITALS — BP 116/68 | HR 86 | Resp 15

## 2015-01-22 DIAGNOSIS — M722 Plantar fascial fibromatosis: Secondary | ICD-10-CM

## 2015-01-22 DIAGNOSIS — B351 Tinea unguium: Secondary | ICD-10-CM

## 2015-01-22 MED ORDER — TRIAMCINOLONE ACETONIDE 10 MG/ML IJ SUSP
10.0000 mg | Freq: Once | INTRAMUSCULAR | Status: AC
  Administered 2015-01-22: 10 mg

## 2015-01-24 NOTE — Progress Notes (Signed)
Subjective:     Patient ID: [name], female   DOB: 09/01/1920, 81 y.o.   MRN: [id number]  HPI patient states my heels been hurting me on my left and my nails are thickened on both feet and I cannot cut them myself and they get sore   Review of Systems     Objective:   Physical Exam Neurovascular status intact muscle strength was found to be adequate and is noted to be inflammation pain left plantar heel at the insertional point tendon the calcaneus with incurvated nailbeds 1-5 both feet that are painful when pressed    Assessment:     Plantar fasciitis left with nail disease thickness and incurvation 1-5 both feet    Plan:     Inject the left plantar fascia 3 Milligan Kenalog 5 mill grams Xylocaine and debrided nailbeds 1-5 both feet with no iatrogenic bleeding noted

## 2015-02-11 ENCOUNTER — Ambulatory Visit (INDEPENDENT_AMBULATORY_CARE_PROVIDER_SITE_OTHER): Payer: Medicare Other | Admitting: *Deleted

## 2015-02-11 DIAGNOSIS — I495 Sick sinus syndrome: Secondary | ICD-10-CM | POA: Diagnosis not present

## 2015-02-11 NOTE — Progress Notes (Signed)
Remote pacemaker transmission.   

## 2015-02-16 LAB — CUP PACEART REMOTE DEVICE CHECK
Battery Remaining Longevity: 106 mo
Brady Statistic RV Percent Paced: 1 %
Lead Channel Impedance Value: 0 Ohm
Lead Channel Pacing Threshold Amplitude: 0.625 V
Lead Channel Setting Pacing Pulse Width: 0.4 ms
Lead Channel Setting Sensing Sensitivity: 5.6 mV
MDC IDC MSMT BATTERY IMPEDANCE: 374 Ohm
MDC IDC MSMT BATTERY VOLTAGE: 2.78 V
MDC IDC MSMT LEADCHNL RV IMPEDANCE VALUE: 612 Ohm
MDC IDC MSMT LEADCHNL RV PACING THRESHOLD PULSEWIDTH: 0.4 ms
MDC IDC MSMT LEADCHNL RV SENSING INTR AMPL: 16 mV
MDC IDC SESS DTM: 20160531121336
MDC IDC SET LEADCHNL RV PACING AMPLITUDE: 2 V

## 2015-03-05 ENCOUNTER — Encounter: Payer: Self-pay | Admitting: Cardiology

## 2015-03-18 ENCOUNTER — Encounter: Payer: Self-pay | Admitting: Internal Medicine

## 2015-03-18 ENCOUNTER — Other Ambulatory Visit: Payer: Self-pay | Admitting: Internal Medicine

## 2015-03-19 ENCOUNTER — Other Ambulatory Visit: Payer: Self-pay | Admitting: Internal Medicine

## 2015-03-26 ENCOUNTER — Other Ambulatory Visit: Payer: Self-pay | Admitting: Internal Medicine

## 2015-03-30 ENCOUNTER — Inpatient Hospital Stay (HOSPITAL_COMMUNITY)
Admission: EM | Admit: 2015-03-30 | Discharge: 2015-04-04 | DRG: 872 | Disposition: A | Discharge status: Home / Self Care | Payer: Medicare Other | Attending: Internal Medicine | Admitting: Internal Medicine

## 2015-03-30 ENCOUNTER — Encounter (HOSPITAL_COMMUNITY): Payer: Self-pay | Admitting: Emergency Medicine

## 2015-03-30 ENCOUNTER — Emergency Department (HOSPITAL_COMMUNITY): Payer: Medicare Other

## 2015-03-30 DIAGNOSIS — Z91048 Other nonmedicinal substance allergy status: Secondary | ICD-10-CM

## 2015-03-30 DIAGNOSIS — R55 Syncope and collapse: Secondary | ICD-10-CM | POA: Diagnosis not present

## 2015-03-30 DIAGNOSIS — M810 Age-related osteoporosis without current pathological fracture: Secondary | ICD-10-CM | POA: Diagnosis present

## 2015-03-30 DIAGNOSIS — I4891 Unspecified atrial fibrillation: Secondary | ICD-10-CM | POA: Diagnosis present

## 2015-03-30 DIAGNOSIS — Z853 Personal history of malignant neoplasm of breast: Secondary | ICD-10-CM

## 2015-03-30 DIAGNOSIS — Z950 Presence of cardiac pacemaker: Secondary | ICD-10-CM | POA: Diagnosis not present

## 2015-03-30 DIAGNOSIS — N390 Urinary tract infection, site not specified: Secondary | ICD-10-CM | POA: Diagnosis present

## 2015-03-30 DIAGNOSIS — K219 Gastro-esophageal reflux disease without esophagitis: Secondary | ICD-10-CM | POA: Diagnosis present

## 2015-03-30 DIAGNOSIS — Z888 Allergy status to other drugs, medicaments and biological substances status: Secondary | ICD-10-CM

## 2015-03-30 DIAGNOSIS — D509 Iron deficiency anemia, unspecified: Secondary | ICD-10-CM | POA: Diagnosis present

## 2015-03-30 DIAGNOSIS — E1122 Type 2 diabetes mellitus with diabetic chronic kidney disease: Secondary | ICD-10-CM | POA: Diagnosis present

## 2015-03-30 DIAGNOSIS — M1990 Unspecified osteoarthritis, unspecified site: Secondary | ICD-10-CM | POA: Diagnosis present

## 2015-03-30 DIAGNOSIS — N179 Acute kidney failure, unspecified: Secondary | ICD-10-CM | POA: Diagnosis not present

## 2015-03-30 DIAGNOSIS — N183 Chronic kidney disease, stage 3 unspecified: Secondary | ICD-10-CM | POA: Diagnosis present

## 2015-03-30 DIAGNOSIS — R791 Abnormal coagulation profile: Secondary | ICD-10-CM | POA: Diagnosis present

## 2015-03-30 DIAGNOSIS — Z7901 Long term (current) use of anticoagulants: Secondary | ICD-10-CM | POA: Diagnosis not present

## 2015-03-30 DIAGNOSIS — N189 Chronic kidney disease, unspecified: Secondary | ICD-10-CM | POA: Diagnosis not present

## 2015-03-30 DIAGNOSIS — I482 Chronic atrial fibrillation: Secondary | ICD-10-CM | POA: Diagnosis present

## 2015-03-30 DIAGNOSIS — Z880 Allergy status to penicillin: Secondary | ICD-10-CM

## 2015-03-30 DIAGNOSIS — Z66 Do not resuscitate: Secondary | ICD-10-CM | POA: Diagnosis present

## 2015-03-30 DIAGNOSIS — Z881 Allergy status to other antibiotic agents status: Secondary | ICD-10-CM

## 2015-03-30 DIAGNOSIS — I959 Hypotension, unspecified: Secondary | ICD-10-CM | POA: Diagnosis present

## 2015-03-30 DIAGNOSIS — Z882 Allergy status to sulfonamides status: Secondary | ICD-10-CM | POA: Diagnosis not present

## 2015-03-30 DIAGNOSIS — A419 Sepsis, unspecified organism: Principal | ICD-10-CM | POA: Diagnosis present

## 2015-03-30 DIAGNOSIS — F0390 Unspecified dementia without behavioral disturbance: Secondary | ICD-10-CM

## 2015-03-30 DIAGNOSIS — E119 Type 2 diabetes mellitus without complications: Secondary | ICD-10-CM

## 2015-03-30 DIAGNOSIS — F0280 Dementia in other diseases classified elsewhere without behavioral disturbance: Secondary | ICD-10-CM | POA: Diagnosis present

## 2015-03-30 DIAGNOSIS — I10 Essential (primary) hypertension: Secondary | ICD-10-CM | POA: Diagnosis present

## 2015-03-30 DIAGNOSIS — E869 Volume depletion, unspecified: Secondary | ICD-10-CM | POA: Diagnosis present

## 2015-03-30 DIAGNOSIS — R112 Nausea with vomiting, unspecified: Secondary | ICD-10-CM

## 2015-03-30 DIAGNOSIS — E039 Hypothyroidism, unspecified: Secondary | ICD-10-CM | POA: Diagnosis present

## 2015-03-30 DIAGNOSIS — I129 Hypertensive chronic kidney disease with stage 1 through stage 4 chronic kidney disease, or unspecified chronic kidney disease: Secondary | ICD-10-CM | POA: Diagnosis present

## 2015-03-30 DIAGNOSIS — G309 Alzheimer's disease, unspecified: Secondary | ICD-10-CM | POA: Diagnosis present

## 2015-03-30 DIAGNOSIS — A4101 Sepsis due to Methicillin susceptible Staphylococcus aureus: Secondary | ICD-10-CM | POA: Diagnosis not present

## 2015-03-30 HISTORY — DX: Type 2 diabetes mellitus without complications: E11.9

## 2015-03-30 HISTORY — DX: Dementia in other diseases classified elsewhere, unspecified severity, without behavioral disturbance, psychotic disturbance, mood disturbance, and anxiety: F02.80

## 2015-03-30 HISTORY — DX: Alzheimer's disease, unspecified: G30.9

## 2015-03-30 LAB — I-STAT CG4 LACTIC ACID, ED: Lactic Acid, Venous: 4.58 mmol/L (ref 0.5–2.0)

## 2015-03-30 LAB — COMPREHENSIVE METABOLIC PANEL
ALBUMIN: 3.1 g/dL — AB (ref 3.5–5.0)
ALK PHOS: 95 U/L (ref 38–126)
ALT: 13 U/L — AB (ref 14–54)
AST: 29 U/L (ref 15–41)
Anion gap: 13 (ref 5–15)
BILIRUBIN TOTAL: 0.5 mg/dL (ref 0.3–1.2)
BUN: 20 mg/dL (ref 6–20)
CHLORIDE: 100 mmol/L — AB (ref 101–111)
CO2: 23 mmol/L (ref 22–32)
Calcium: 8.6 mg/dL — ABNORMAL LOW (ref 8.9–10.3)
Creatinine, Ser: 1.66 mg/dL — ABNORMAL HIGH (ref 0.44–1.00)
GFR calc Af Amer: 30 mL/min — ABNORMAL LOW (ref >60)
GFR calc non Af Amer: 26 mL/min — ABNORMAL LOW (ref >60)
Glucose, Bld: 201 mg/dL — ABNORMAL HIGH (ref 65–99)
Potassium: 3.7 mmol/L (ref 3.5–5.1)
Sodium: 136 mmol/L (ref 135–145)
Total Protein: 6.2 g/dL — ABNORMAL LOW (ref 6.5–8.1)

## 2015-03-30 LAB — URINALYSIS, ROUTINE W REFLEX MICROSCOPIC
Bilirubin Urine: NEGATIVE
Glucose, UA: 100 mg/dL — AB
Ketones, ur: NEGATIVE mg/dL
NITRITE: NEGATIVE
SPECIFIC GRAVITY, URINE: 1.029 (ref 1.005–1.030)
Urobilinogen, UA: 0.2 mg/dL (ref 0.0–1.0)
pH: 7 (ref 5.0–8.0)

## 2015-03-30 LAB — LIPASE, BLOOD: Lipase: 27 U/L (ref 22–51)

## 2015-03-30 LAB — CBC WITH DIFFERENTIAL/PLATELET
BASOS ABS: 0 10*3/uL (ref 0.0–0.1)
BASOS PCT: 0 % (ref 0–1)
Eosinophils Absolute: 0 10*3/uL (ref 0.0–0.7)
Eosinophils Relative: 0 % (ref 0–5)
HEMATOCRIT: 44.1 % (ref 36.0–46.0)
Hemoglobin: 14 g/dL (ref 12.0–15.0)
LYMPHS PCT: 32 % (ref 12–46)
Lymphs Abs: 2 10*3/uL (ref 0.7–4.0)
MCH: 29.2 pg (ref 26.0–34.0)
MCHC: 31.7 g/dL (ref 30.0–36.0)
MCV: 91.9 fL (ref 78.0–100.0)
MONO ABS: 0.1 10*3/uL (ref 0.1–1.0)
Monocytes Relative: 2 % — ABNORMAL LOW (ref 3–12)
NEUTROS PCT: 66 % (ref 43–77)
Neutro Abs: 4.1 10*3/uL (ref 1.7–7.7)
Platelets: 221 10*3/uL (ref 150–400)
RBC: 4.8 MIL/uL (ref 3.87–5.11)
RDW: 17.6 % — ABNORMAL HIGH (ref 11.5–15.5)
WBC: 6.3 10*3/uL (ref 4.0–10.5)

## 2015-03-30 LAB — TSH: TSH: 1.718 u[IU]/mL (ref 0.350–4.500)

## 2015-03-30 LAB — PROTIME-INR
INR: 3.78 — AB (ref 0.00–1.49)
PROTHROMBIN TIME: 36.4 s — AB (ref 11.6–15.2)

## 2015-03-30 LAB — TROPONIN I

## 2015-03-30 LAB — URINE MICROSCOPIC-ADD ON

## 2015-03-30 LAB — POC OCCULT BLOOD, ED: Fecal Occult Bld: NEGATIVE

## 2015-03-30 LAB — MRSA PCR SCREENING: MRSA BY PCR: NEGATIVE

## 2015-03-30 LAB — MAGNESIUM: Magnesium: 1.6 mg/dL — ABNORMAL LOW (ref 1.7–2.4)

## 2015-03-30 LAB — CLOSTRIDIUM DIFFICILE BY PCR: CDIFFPCR: NEGATIVE

## 2015-03-30 MED ORDER — WARFARIN - PHARMACIST DOSING INPATIENT: Freq: Every day | Status: DC

## 2015-03-30 MED ORDER — METRONIDAZOLE IN NACL 5-0.79 MG/ML-% IV SOLN
500.0000 mg | Freq: Three times a day (TID) | INTRAVENOUS | Status: DC
  Administered 2015-03-30 – 2015-03-31 (×2): 500 mg via INTRAVENOUS
  Filled 2015-03-30 (×3): qty 100

## 2015-03-30 MED ORDER — SODIUM CHLORIDE 0.9 % IV BOLUS (SEPSIS)
500.0000 mL | INTRAVENOUS | Status: AC
  Administered 2015-03-30: 500 mL via INTRAVENOUS

## 2015-03-30 MED ORDER — CETYLPYRIDINIUM CHLORIDE 0.05 % MT LIQD
7.0000 mL | Freq: Two times a day (BID) | OROMUCOSAL | Status: DC
  Administered 2015-03-31 (×2): 7 mL via OROMUCOSAL

## 2015-03-30 MED ORDER — DEXTROSE 5 % IV SOLN
500.0000 mg | Freq: Three times a day (TID) | INTRAVENOUS | Status: DC
  Administered 2015-03-30 – 2015-04-01 (×5): 500 mg via INTRAVENOUS
  Filled 2015-03-30 (×6): qty 0.5

## 2015-03-30 MED ORDER — ONDANSETRON HCL 4 MG/2ML IJ SOLN
4.0000 mg | Freq: Once | INTRAMUSCULAR | Status: AC | PRN
  Administered 2015-03-30: 4 mg via INTRAVENOUS
  Filled 2015-03-30: qty 2

## 2015-03-30 MED ORDER — VANCOMYCIN HCL IN DEXTROSE 750-5 MG/150ML-% IV SOLN
750.0000 mg | INTRAVENOUS | Status: DC
  Administered 2015-03-31: 750 mg via INTRAVENOUS
  Filled 2015-03-30 (×2): qty 150

## 2015-03-30 MED ORDER — SODIUM CHLORIDE 0.9 % IV BOLUS (SEPSIS): 1000.0000 mL | Freq: Once | INTRAVENOUS | Status: DC

## 2015-03-30 MED ORDER — ALBUTEROL SULFATE (2.5 MG/3ML) 0.083% IN NEBU: 3.0000 mL | INHALATION_SOLUTION | Freq: Four times a day (QID) | RESPIRATORY_TRACT | Status: DC | PRN

## 2015-03-30 MED ORDER — DEXTROSE 5 % IV SOLN
2.0000 g | Freq: Once | INTRAVENOUS | Status: AC
  Administered 2015-03-30: 2 g via INTRAVENOUS
  Filled 2015-03-30: qty 2

## 2015-03-30 MED ORDER — SODIUM CHLORIDE 0.9 % IV SOLN
INTRAVENOUS | Status: DC
  Administered 2015-03-30: 75 mL/h via INTRAVENOUS
  Administered 2015-03-30: 1000 mL via INTRAVENOUS

## 2015-03-30 MED ORDER — CHLORHEXIDINE GLUCONATE 0.12 % MT SOLN
15.0000 mL | Freq: Two times a day (BID) | OROMUCOSAL | Status: DC
  Administered 2015-03-31 – 2015-04-01 (×3): 15 mL via OROMUCOSAL
  Filled 2015-03-30 (×5): qty 15

## 2015-03-30 MED ORDER — CETYLPYRIDINIUM CHLORIDE 0.05 % MT LIQD
7.0000 mL | Freq: Two times a day (BID) | OROMUCOSAL | Status: DC
  Administered 2015-03-31 – 2015-04-03 (×5): 7 mL via OROMUCOSAL

## 2015-03-30 MED ORDER — SODIUM CHLORIDE 0.9 % IV SOLN
1500.0000 mg | Freq: Once | INTRAVENOUS | Status: AC
  Administered 2015-03-30: 1500 mg via INTRAVENOUS
  Filled 2015-03-30: qty 1500

## 2015-03-30 MED ORDER — LEVOTHYROXINE SODIUM 75 MCG PO TABS
37.5000 ug | ORAL_TABLET | Freq: Every day | ORAL | Status: DC
  Administered 2015-03-31 – 2015-04-04 (×5): 37.5 ug via ORAL
  Filled 2015-03-30 (×4): qty 0.5
  Filled 2015-03-30: qty 1
  Filled 2015-03-30 (×2): qty 0.5

## 2015-03-30 MED ORDER — SODIUM CHLORIDE 0.9 % IV BOLUS (SEPSIS)
1000.0000 mL | INTRAVENOUS | Status: AC
  Administered 2015-03-30 (×2): 1000 mL via INTRAVENOUS

## 2015-03-30 NOTE — ED Notes (Signed)
Place pt on 4 L nasal cannula

## 2015-03-30 NOTE — ED Notes (Addendum)
Pt reports feeling SOB and gasping for air.  Asked for any provider to come see the pt.  Sat pt upright to vomit again.  Dr Minda Ditto and Quincy Carnes, PA, came to bedside.

## 2015-03-30 NOTE — ED Notes (Signed)
Placed pt on non-rebreather and repositioned into trendelenburg.  Manual confirmed 58/32

## 2015-03-30 NOTE — H&P (Signed)
History and Physical  [name] HFW:[id number] DOB: July 07, 1920 DOA: 03/30/2015  Referring physician: EDP PCP: Dawn Miyamoto, MD   Chief Complaint: syncope x2, low bp, lactic acidosis  HPI: [name] is a 81 y.o. female  hx of AFIB on coumadin, s/p pacemaker, HTN, anemia, hypothyroidism, CKD, progressive dementia, presenting to the ED following syncope x2. Patient lives at home, has caregiver that reports patient was complaining of generalized abdominal pain earlier today and felt she needed to have a bowel movement. Caregiver assisted patient to bathroom where she had a syncopal episode on the toilet. Caregiver was able to arouse her and put her in the wheelchair where she had another syncopal episode. EMS was called, hypotensive on their arrival. She began vomiting en route to ED. Patient does require supplemental oxygen at all times, 3L at baseline. Patient is DNR.  ED course: cxr no acute findings, labs with lactic acidosis, ua ? Uti, she was given ns bolus 2.5liter,  bp stabilized, she was also given iv vanc and aztreonam for sepsis, hospitalist called to admit the patient.  Upon entering patient's room, patient is lethargic, does open eyes to voice and follow commend, patient is having loose smelling stool.  per caregiver, patient has "frequent uti" needing frequent abx, last received levaquin in 01/2015,patient last bm was formed. Per caregiver patient was in her usual state of health yesterday evening, but developed ab pain, vomiting today, syncope x2, no fall.  patient normally able to ambulate with a walker,  progressive dementia, only oriented to self at baseline. Patient does not have immediate family. She has an established DNR status.   Review of Systems:  Detail per HPI, Review of systems are otherwise negative  Past Medical History  Diagnosis Date  . Arthritis   . Chronic atrial fibrillation       chronic coumadin anticoagulation  . Hypertension     . Esophageal reflux     with paraesophageal hernia  . Diverticulosis   . Spinal stenosis     with chronic LBP  . Anemia   . Vitamin D deficiency   . Osteoporosis   . UTI (lower urinary tract infection)   . Tachy-brady syndrome   . Varicose veins   . Fatigue   . History of RIND (reversible ischemic neurological deficit)   . Hypothyroidism   . Complication of anesthesia     "very sensitive to it; hard for me to wakeup"  . Breast cancer     right  . Kidney stones   . Angina   . Pacemaker -[employer]onic     DOI 2003/ Change out 2013  . UTI (lower urinary tract infection)   . Stage III chronic kidney disease   . Alzheimer's disease   . Diabetes mellitus without complication    Past Surgical History  Procedure Laterality Date  . Eye surgery      cataract bilateral  . Breast lumpectomy      right  . Tonsillectomy      "as a child"  . Appendectomy  1939  . Oophorectomy  1951    right; "had tumor there"  . Insert / replace / remove pacemaker Left     Dr Caryl Comes is cardiologist  . Pacemaker generator change N/A 11/01/2011    Procedure: PACEMAKER GENERATOR CHANGE;  Surgeon: Deboraha Sprang, MD;  Location: [facility] CATH LAB;  Service: Cardiovascular;  Laterality: N/A;   Social History:  reports that she has never smoked. She has never used  smokeless tobacco. She reports that she does not drink alcohol or use illicit drugs. Patient lives at home with 24/7 care & is able to participate in activities of daily living with assistance, ambulate with a walker  Allergies  Allergen Reactions  . Ceftin [Cefuroxime Axetil] Shortness Of Breath and Itching  . Amiodarone Other (See Comments)    dizziness  . Micardis [Telmisartan] Other (See Comments)    Fatigue and back pain  . Plavix [Clopidogrel Bisulfate] Other (See Comments)    dizziness  . Benzyl Alcohol Other (See Comments)    Derivatives also   . Caffeine Rash  . Chlordiazepoxide-Clidinium Rash  . Codeine Phosphate Rash  . Conjugated  Estrogens Rash  . Darvon Rash  . Dilaudid [Hydromorphone Hcl] Rash  . Dimetapp Cold-Allergy Rash  . Dyazide [Hydrochlorothiazide W-Triamterene] Rash  . Erythromycin Rash  . Estrogens Rash  . Fungizone [Amphotericin B] Other (See Comments)    Unsure if fungi allergy or amphotericin B allergy?  . Guaifenesin & Derivatives Rash  . Ibuprofen Rash  . Influenza Vaccines Other (See Comments)    unknown  . Iodine Rash  . Milk [Dairy] Other (See Comments)    unknown  . Nitroglycerin Other (See Comments)    Head ache   . Other Other (See Comments)    dog and cat dander, mold, mildew, fungi, dust, wool, soaps, chocolate, pineapple, beets, watermelon   . Penicillins Rash    Also penicillin G    . Petroleum Jelly [Petrolatum] Other (See Comments)    Lipsticks also  . Prednisone Rash  . Sulfamethoxazole Rash  . Tagamet [Cimetidine] Rash       . Talwin [Pentazocine] Rash  . Tape Other (See Comments)    unknown  . Zithromax [Azithromycin Dihydrate] Rash    Family History  Problem Relation Age of Onset  . Cancer Sister       Prior to Admission medications   Medication Sig Start Date End Date Taking? Authorizing Provider  albuterol (PROVENTIL HFA;VENTOLIN HFA) 108 (90 BASE) MCG/ACT inhaler Inhale 1 puff into the lungs every 6 (six) hours as needed for wheezing or shortness of breath. 11/02/11 03/30/15 Yes Belkys A Regalado, MD  albuterol (PROVENTIL) (2.5 MG/3ML) 0.083% nebulizer solution Take 3 mLs (2.5 mg total) by nebulization every 2 (two) hours as needed for wheezing. 12/22/14  Yes Venetia Maxon Rama, MD  cholecalciferol (VITAMIN D) 1000 UNITS tablet Take 1,000 Units by mouth daily.   Yes Historical Provider, MD  diltiazem (CARDIZEM CD) 180 MG 24 hr capsule Take 1 capsule by mouth daily. 11/29/14  Yes Historical Provider, MD  ferrous sulfate 325 (65 FE) MG tablet Take 1 tablet (325 mg total) by mouth daily with breakfast. 12/22/14  Yes Christina P Rama, MD  furosemide (LASIX) 40 MG  tablet Take 40 mg by mouth daily. 03/18/15  Yes Historical Provider, MD  levothyroxine (SYNTHROID, LEVOTHROID) 75 MCG tablet Take 37.5 mcg by mouth daily.   Yes Historical Provider, MD  loratadine (CLARITIN) 10 MG tablet Take 10 mg by mouth daily.   Yes Historical Provider, MD  Menthol-Methyl Salicylate (MUSCLE RUB) 10-15 % CREA Apply 1 application topically 3 (three) times daily as needed for muscle pain.   Yes Historical Provider, MD  Methylfol-Algae-B12-Acetylcyst (CEREFOLIN NAC) 6-90.314-2-600 MG TABS Take 1 tablet by mouth daily. For memory   Yes Historical Provider, MD  Multiple Vitamins-Minerals (ICAPS AREDS FORMULA PO) Take 1 capsule by mouth at bedtime.    Yes Historical Provider, MD  omeprazole (Elsa)  20 MG capsule Take 20 mg by mouth daily.   Yes Historical Provider, MD  Oxycodone HCl 10 MG TABS Take 10 mg by mouth every 8 (eight) hours as needed (pain).  11/19/14  Yes Historical Provider, MD  ranitidine (ZANTAC) 150 MG tablet Take 150 mg by mouth 2 (two) times daily.   Yes Historical Provider, MD  sertraline (ZOLOFT) 25 MG tablet Take 12.5 mg by mouth daily.   Yes Historical Provider, MD  trandolapril (MAVIK) 1 MG tablet Take 1 mg by mouth daily.   Yes Historical Provider, MD  warfarin (COUMADIN) 3 MG tablet Take 3 mg by mouth See admin instructions. SKIPS DOSES ON MON AND WED ONLY TAKES 3MG  ALL OTHER DAYS 11/27/11  Yes Rogelia Mire, NP  levofloxacin (LEVAQUIN) 750 MG tablet Take 1 tablet (750 mg total) by mouth every other day. 12/22/14   Venetia Maxon Rama, MD  potassium chloride SA (K-DUR,KLOR-CON) 20 MEQ tablet Take 1 tablet (20 mEq total) by mouth 2 (two) times daily. 12/22/14   Venetia Maxon Rama, MD    Physical Exam: BP 114/56 mmHg  Pulse 95  Temp(Src) 98.9 F (37.2 C) (Oral)  Resp 18  Ht 5\' 2"  (1.575 m)  Wt 75.5 kg (166 lb 7.2 oz)  BMI 30.44 kg/m2  SpO2 98%  General:  Lethargic, frail, open eyes to voice, does follow command, only oriented to self. Eyes: PERRL ENT:  unremarkable Neck: supple, no JVD Cardiovascular: IRRR Respiratory: diminished at bases, more prominent on the right lower base, correlate with chronically elevated right hemidiaphragm on cxr., otherwise, no obvious wheezing, no rhonchi, no rales Abdomen: soft/ND/ND, positive bowel sounds Skin: no rash Musculoskeletal:  No edema Psychiatric: calm/cooperative Neurologic: demented, lethargic, open eyes to voice, does follow command, only oriented to self. no focal findings            Labs on Admission:  Basic Metabolic Panel:  Recent Labs Lab 03/30/15 1450  NA 136  K 3.7  CL 100*  CO2 23  GLUCOSE 201*  BUN 20  CREATININE 1.66*  CALCIUM 8.6*   Liver Function Tests:  Recent Labs Lab 03/30/15 1450  AST 29  ALT 13*  ALKPHOS 95  BILITOT 0.5  PROT 6.2*  ALBUMIN 3.1*    Recent Labs Lab 03/30/15 1450  LIPASE 27   No results for input(s): AMMONIA in the last 168 hours. CBC:  Recent Labs Lab 03/30/15 1450  WBC 6.3  NEUTROABS 4.1  HGB 14.0  HCT 44.1  MCV 91.9  PLT 221   Cardiac Enzymes:  Recent Labs Lab 03/30/15 1450  TROPONINI <0.03    BNP (last 3 results)  Recent Labs  12/17/14 0656 12/18/14 1250  BNP 328.8* 691.4*    ProBNP (last 3 results) No results for input(s): PROBNP in the last 8760 hours.  CBG: No results for input(s): GLUCAP in the last 168 hours.  Radiological Exams on Admission: Dg Chest Portable 1 View  03/30/2015   CLINICAL DATA:  Syncopal episode with hypotension  EXAM: PORTABLE CHEST - 1 VIEW  COMPARISON:  12/18/2014  FINDINGS: Cardiac shadow is again enlarged. A pacing device is again seen and stable. Elevation the right hemidiaphragm is again noted. No focal confluent infiltrate is seen. With improved aeration from the prior exam is noted.  IMPRESSION: Chronic changes without acute abnormality.   Electronically Signed   By: Inez Catalina M.D.   On: 03/30/2015 15:13    EKG: Independently reviewed. Chronic afib, rate  controlled  Assessment/Plan Present  on Admission:  . Sepsis . Syncope   Syncope: likely from hypotension. No focal neurological findings.   Sepsis: with hypotension/lactic acidosis, source? uti vs c diff, culture pending,  Vanc/aztreonam started from ED, will continue for now, will add flagyl. Continue ivf. bp has improved, Consider stress dose steroids if bp drops. Will keep NPO due to lethargy, swallow eval pending.  ARF: cr 1.66, baseline 0.8. Lasix held, on ivf, renal dosing meds.  Chronic afib, rate controlled. On chronic anticoagulation, will check inr, coumadin pharmacy to dose. Hold cardizem due to low bp,  H/o tachybrady syndrome: s/p pacemaker.  H/o hypothyroidism: check tsh. Resume synthroid when able to take po safely.  DVT prophylaxis: on chronic anticoagulation, inr pending, pharmacy to dose coumadin  Consultants: none  Code Status: DNR  Family Communication:  Patient and caregiver at bedside  Disposition Plan: admit to stepdown  Time spent: 62mins  Dawn Stenglein MD, PhD [facility] Pager [phone] If 7PM-7AM, please contact night-coverage at [email or web address], password [facility]

## 2015-03-30 NOTE — ED Notes (Signed)
Dr. Yao at bedside. 

## 2015-03-30 NOTE — ED Provider Notes (Signed)
CSN: [id number]     Arrival date & time 03/30/15  1433 History   First MD Initiated Contact with Patient 03/30/15 1500     Chief Complaint  Patient presents with  . Loss of Consciousness  . Emesis  . Hypotension     (Consider location/radiation/quality/duration/timing/severity/associated sxs/prior Treatment) The history is provided by the patient and medical records.    LEVEL 5 CAVEAT:  ACUITY OF CONDITION This is a 81 y.o. F with hx of AFIB on coumadin, HTN, anemia, hypothyroidism, CKD, presenting to the ED following syncope x2.  Patient lives at home, has caregiver that reports patient was complaining of generalized abdominal pain earlier today and felt she needed to have a bowel movement.  Caregiver assisted patient to bathroom where she had a syncopal episode on the toilet.  Caregiver was able to arouse her and put her in the wheelchair where she had another syncopal episode.  EMS was called, hypotensive on their arrival.  She began vomiting en route to ED.  Patient does require supplemental oxygen at all times, 3L at baseline.  Patient is DNR.  Past Medical History  Diagnosis Date  . Arthritis   . Chronic atrial fibrillation       chronic coumadin anticoagulation  . Hypertension   . Esophageal reflux     with paraesophageal hernia  . Diverticulosis   . Spinal stenosis     with chronic LBP  . Anemia   . Vitamin D deficiency   . Osteoporosis   . UTI (lower urinary tract infection)   . Tachy-brady syndrome   . Varicose veins   . Fatigue   . History of RIND (reversible ischemic neurological deficit)   . Hypothyroidism   . Complication of anesthesia     "very sensitive to it; hard for me to wakeup"  . Breast cancer     right  . Kidney stones   . Angina   . Pacemaker -Medtronic     DOI 2003/ Change out 2013  . UTI (lower urinary tract infection)   . Stage III chronic kidney disease    Past Surgical History  Procedure Laterality Date  . Eye surgery      cataract  bilateral  . Breast lumpectomy      right  . Tonsillectomy      "as a child"  . Appendectomy  1939  . Oophorectomy  1951    right; "had tumor there"  . Insert / replace / remove pacemaker Left     Dr Caryl Comes is cardiologist  . Pacemaker generator change N/A 11/01/2011    Procedure: PACEMAKER GENERATOR CHANGE;  Surgeon: Deboraha Sprang, MD;  Location: [facility] CATH LAB;  Service: Cardiovascular;  Laterality: N/A;   Family History  Problem Relation Age of Onset  . Cancer Sister    History  Substance Use Topics  . Smoking status: Never Smoker   . Smokeless tobacco: Never Used  . Alcohol Use: No   OB History    No data available     Review of Systems  Unable to perform ROS: Acuity of condition      Allergies  Ceftin; Amiodarone; Beet; Benzyl alc-guai-pse; Caffeine; Chlordiazepoxide-clidinium; Chocolate; Codeine phosphate; Darvon; Dilaudid; Dimetapp cold-allergy; Dust mite extract; Estrogens; Fungizone; Iodine; Micardis; Milk; Mold extract; Motrin; Nitroglycerin; Penicillins; Pentazocine lactate; Petroleum jelly; Pineapple; Plavix; Prednisone; Soap; Sulfamethoxazole; Tagamet; Tape; Watermelon concentrate; Zithromax; Dyazide; Erythromycin; and Talwin  Home Medications   Prior to Admission medications   Medication Sig Start Date End Date  Taking? Authorizing Provider  albuterol (PROVENTIL HFA;VENTOLIN HFA) 108 (90 BASE) MCG/ACT inhaler Inhale 1 puff into the lungs every 6 (six) hours as needed for wheezing or shortness of breath. 11/02/11 12/17/14  Belkys A Regalado, MD  albuterol (PROVENTIL) (2.5 MG/3ML) 0.083% nebulizer solution Take 3 mLs (2.5 mg total) by nebulization every 2 (two) hours as needed for wheezing. 12/22/14   Venetia Maxon Rama, MD  cholecalciferol (VITAMIN D) 1000 UNITS tablet Take 1,000 Units by mouth daily.    Historical Provider, MD  diltiazem (CARDIZEM CD) 180 MG 24 hr capsule Take 1 capsule by mouth daily. 11/29/14   Historical Provider, MD  famotidine (PEPCID) 10 MG tablet  Take 10 mg by mouth daily as needed for heartburn.    Historical Provider, MD  ferrous sulfate 325 (65 FE) MG tablet Take 1 tablet (325 mg total) by mouth daily with breakfast. 12/22/14   Venetia Maxon Rama, MD  furosemide (LASIX) 20 MG tablet Take 2 tablets (40 mg total) by mouth daily. 12/22/14   Venetia Maxon Rama, MD  levofloxacin (LEVAQUIN) 750 MG tablet Take 1 tablet (750 mg total) by mouth every other day. 12/22/14   Christina P Rama, MD  levothyroxine (SYNTHROID, LEVOTHROID) 75 MCG tablet Take 37.5 mcg by mouth daily.    Historical Provider, MD  loratadine (CLARITIN) 10 MG tablet Take 10 mg by mouth daily.    Historical Provider, MD  Menthol-Methyl Salicylate (MUSCLE RUB) 10-15 % CREA Apply 1 application topically 3 (three) times daily as needed for muscle pain.    Historical Provider, MD  Methylfol-Algae-B12-Acetylcyst (CEREFOLIN NAC) 6-90.314-2-600 MG TABS Take 1 tablet by mouth daily.    Historical Provider, MD  Multiple Vitamins-Minerals (ICAPS AREDS FORMULA PO) Take 1 capsule by mouth daily.    Historical Provider, MD  omeprazole (PRILOSEC) 20 MG capsule Take 20 mg by mouth daily.    Historical Provider, MD  Oxycodone HCl 10 MG TABS Take 10 mg by mouth every 8 (eight) hours as needed (pain).  11/19/14   Historical Provider, MD  potassium chloride SA (K-DUR,KLOR-CON) 20 MEQ tablet Take 1 tablet (20 mEq total) by mouth 2 (two) times daily. 12/22/14   Venetia Maxon Rama, MD  ranitidine (ZANTAC) 150 MG tablet Take 150 mg by mouth 2 (two) times daily.    Historical Provider, MD  sertraline (ZOLOFT) 25 MG tablet Take 12.5 mg by mouth daily.    Historical Provider, MD  trandolapril (MAVIK) 1 MG tablet Take 1 mg by mouth daily.    Historical Provider, MD  warfarin (COUMADIN) 3 MG tablet Take 3 mg by mouth See admin instructions. 11/27/11   Rogelia Mire, NP   BP 67/14 mmHg  Pulse 43  Temp(Src) 98.5 F (36.9 C) (Rectal)  Resp 20  SpO2 93%   Physical Exam  Constitutional: She appears  well-developed and well-nourished. No distress.  Elderly, ill appearing, moaning  HENT:  Head: Normocephalic and atraumatic.  Mouth/Throat: Oropharynx is clear and moist.  Mucous membranes dry  Eyes: Conjunctivae and EOM are normal. Pupils are equal, round, and reactive to light.  Neck: Normal range of motion. Neck supple.  Cardiovascular: Normal rate, regular rhythm and normal heart sounds.   Pulmonary/Chest: Effort normal. She has no wheezes. She has rhonchi.  Increased work of breathing, diffuse rhonchi but more pronounced in left lung fields  Abdominal: Soft. Bowel sounds are normal. There is no tenderness. There is no guarding.  Abdomen overall non-tender  Musculoskeletal: Normal range of motion. She exhibits no edema.  Neurological: She is alert.  Awake, answering limited questions  Skin: Skin is warm and dry. She is not diaphoretic.  Psychiatric: She has a normal mood and affect.  Nursing note and vitals reviewed.   ED Course  Procedures (including critical care time)  CRITICAL CARE Performed by: Larene Pickett   Total critical care time: 45  Critical care time was exclusive of separately billable procedures and treating other patients.  Critical care was necessary to treat or prevent imminent or life-threatening deterioration.  Critical care was time spent personally by me on the following activities: development of treatment plan with patient and/or surrogate as well as nursing, discussions with consultants, evaluation of patient's response to treatment, examination of patient, obtaining history from patient or surrogate, ordering and performing treatments and interventions, ordering and review of laboratory studies, ordering and review of radiographic studies, pulse oximetry and re-evaluation of patient's condition.  Medications  sodium chloride 0.9 % bolus 1,000 mL (1,000 mLs Intravenous New Bag/Given 03/30/15 1601)    Followed by  sodium chloride 0.9 % bolus 500 mL  (not administered)  sodium chloride 0.9 % bolus 1,000 mL (not administered)  vancomycin (VANCOCIN) 1,500 mg in sodium chloride 0.9 % 500 mL IVPB (1,500 mg Intravenous New Bag/Given 03/30/15 1549)  aztreonam (AZACTAM) 500 mg in dextrose 5 % 50 mL IVPB (not administered)  vancomycin (VANCOCIN) IVPB 750 mg/150 ml premix (not administered)  ondansetron (ZOFRAN) injection 4 mg (4 mg Intravenous Given 03/30/15 1505)  aztreonam (AZACTAM) 2 g in dextrose 5 % 50 mL IVPB (2 g Intravenous New Bag/Given 03/30/15 1555)    Labs Review Labs Reviewed  COMPREHENSIVE METABOLIC PANEL - Abnormal; Notable for the following:    Chloride 100 (*)    Glucose, Bld 201 (*)    Creatinine, Ser 1.66 (*)    Calcium 8.6 (*)    Total Protein 6.2 (*)    Albumin 3.1 (*)    ALT 13 (*)    GFR calc non Af Amer 26 (*)    GFR calc Af Amer 30 (*)    All other components within normal limits  CBC WITH DIFFERENTIAL/PLATELET - Abnormal; Notable for the following:    RDW 17.6 (*)    Monocytes Relative 2 (*)    All other components within normal limits  URINALYSIS, ROUTINE W REFLEX MICROSCOPIC (NOT AT [facility]) - Abnormal; Notable for the following:    APPearance TURBID (*)    Glucose, UA 100 (*)    Hgb urine dipstick MODERATE (*)    Protein, ur >300 (*)    Leukocytes, UA SMALL (*)    All other components within normal limits  I-STAT CG4 LACTIC ACID, ED - Abnormal; Notable for the following:    Lactic Acid, Venous 4.58 (*)    All other components within normal limits  CULTURE, BLOOD (ROUTINE X 2)  CULTURE, BLOOD (ROUTINE X 2)  URINE CULTURE  LIPASE, BLOOD  TROPONIN I  URINE MICROSCOPIC-ADD ON  PROTIME-INR  POC OCCULT BLOOD, ED    Imaging Review Dg Chest Portable 1 View  03/30/2015   CLINICAL DATA:  Syncopal episode with hypotension  EXAM: PORTABLE CHEST - 1 VIEW  COMPARISON:  12/18/2014  FINDINGS: Cardiac shadow is again enlarged. A pacing device is again seen and stable. Elevation the right hemidiaphragm is again noted.  No focal confluent infiltrate is seen. With improved aeration from the prior exam is noted.  IMPRESSION: Chronic changes without acute abnormality.   Electronically Signed   By: Elta Guadeloupe  Lukens M.D.   On: 03/30/2015 15:13     EKG Interpretation   Date/Time:  Sunday March 30 2015 14:35:41 EDT Ventricular Rate:  109 PR Interval:    QRS Duration: 99 QT Interval:  388 QTC Calculation: 522 R Axis:   84 Text Interpretation:  Atrial fibrillation Borderline right axis deviation  Borderline low voltage, extremity leads Abnormal R-wave progression, early  transition Borderline repolarization abnormality Prolonged QT interval No  significant change since last tracing Confirmed by YAO  MD, DAVID ([id number])  on 03/30/2015 3:08:57 PM      MDM   Final diagnoses:  Sepsis, due to unspecified organism  Acute kidney injury  Nausea and vomiting, vomiting of unspecified type   81 year old female here with syncope, hypotension, and increased respiratory distress. One episode of emesis in route to ED. Patient is requiring increased oxygen from her baseline, slightly increased work of breathing.  She does have diffuse rhonchi, more pronounced on left.  Code sepsis activated after initial assessment.  Patient started on broad spectrum vancomycin and azactam.  Lab work as above-- lactate elevated at 4.58, normal WBC count.  AKI present with Cr of 1.66, likely secondary to dehyration.  CXR negative at this time, ?aspiration.  U/a appears possibly infected, many WBC's and RBC's.  Patient's BP has responded well to fluids, currently 101/59.  Patient awake, alert, and able to answer questions better at this time.  Nurse aide has arrived at bedside, patient has no family.  Patient will remain DNR.  Blood and urine culture pending.  Will admit to medicine service for further management.-- Dr. Erlinda Hong of [facility] hospitalist, step-down.  Temp admit orders placed.  Larene Pickett, PA-C 03/30/15 [place] Yao, MD 03/30/15 [phone]

## 2015-03-30 NOTE — Progress Notes (Addendum)
Addendum: patient with history of afib on chronic coumadin. INR elevated at 3.7, will hold warfarin tonight. No bleeding issues noted on admission, cbc normal.  PTA warfarin dose is 3mg  daily except none on Monday/wednesday.  Erin Hearing PharmD., BCPS Clinical Pharmacist Pager [phone] 03/30/2015 8:31 PM    ANTIBIOTIC CONSULT NOTE - INITIAL  Pharmacy Consult for aztreonam, vancomycin Indication: rule out pneumonia  Allergies  Allergen Reactions  . Ceftin [Cefuroxime Axetil] Shortness Of Breath and Itching  . Amiodarone Other (See Comments)    dizziness  . Micardis [Telmisartan] Other (See Comments)    Fatigue and back pain  . Plavix [Clopidogrel Bisulfate] Other (See Comments)    dizziness  . Benzyl Alcohol Other (See Comments)    Derivatives also   . Caffeine Rash  . Chlordiazepoxide-Clidinium Rash  . Codeine Phosphate Rash  . Conjugated Estrogens Rash  . Darvon Rash  . Dilaudid [Hydromorphone Hcl] Rash  . Dimetapp Cold-Allergy Rash  . Dyazide [Hydrochlorothiazide W-Triamterene] Rash  . Erythromycin Rash  . Estrogens Rash  . Fungizone [Amphotericin B] Other (See Comments)    Unsure if fungi allergy or amphotericin B allergy?  . Guaifenesin & Derivatives Rash  . Ibuprofen Rash  . Influenza Vaccines Other (See Comments)    unknown  . Iodine Rash  . Milk [Dairy] Other (See Comments)    unknown  . Nitroglycerin Other (See Comments)    Head ache   . Other Other (See Comments)    dog and cat dander, mold, mildew, fungi, dust, wool, soaps, chocolate, pineapple, beets, watermelon   . Penicillins Rash    Also penicillin G    . Petroleum Jelly [Petrolatum] Other (See Comments)    Lipsticks also  . Prednisone Rash  . Sulfamethoxazole Rash  . Tagamet [Cimetidine] Rash       . Talwin [Pentazocine] Rash  . Tape Other (See Comments)    unknown  . Zithromax [Azithromycin Dihydrate] Rash    Patient Measurements: Height: 5\' 2"  (157.5 cm) Weight: 166 lb 7.2 oz  (75.5 kg) IBW/kg (Calculated) : 50.1   Vital Signs: Temp: 97.7 F (36.5 C) (07/17 2000) Temp Source: Oral (07/17 2000) BP: 100/55 mmHg (07/17 2000) Pulse Rate: 81 (07/17 2000) Intake/Output from previous day:   Intake/Output from this shift: Total I/O In: 100 [IV Piggyback:100] Out: -   Labs:  Recent Labs  03/30/15 1450  WBC 6.3  HGB 14.0  PLT 221  CREATININE 1.66*   Estimated Creatinine Clearance: 20.6 mL/min (by C-G formula based on Cr of 1.66). No results for input(s): VANCOTROUGH, VANCOPEAK, VANCORANDOM, GENTTROUGH, GENTPEAK, GENTRANDOM, TOBRATROUGH, TOBRAPEAK, TOBRARND, AMIKACINPEAK, AMIKACINTROU, AMIKACIN in the last 72 hours.   Microbiology: Recent Results (from the past 720 hour(s))  Clostridium Difficile by PCR (not at [facility])     Status: None   Collection Time: 03/30/15  5:22 PM  Result Value Ref Range Status   C difficile by pcr NEGATIVE NEGATIVE Final  MRSA PCR Screening     Status: None   Collection Time: 03/30/15  5:52 PM  Result Value Ref Range Status   MRSA by PCR NEGATIVE NEGATIVE Final    Comment:        The GeneXpert MRSA Assay (FDA approved for NASAL specimens only), is one component of a comprehensive MRSA colonization surveillance program. It is not intended to diagnose MRSA infection nor to guide or monitor treatment for MRSA infections.     Medical History: Past Medical History  Diagnosis Date  . Arthritis   .  Chronic atrial fibrillation       chronic coumadin anticoagulation  . Hypertension   . Esophageal reflux     with paraesophageal hernia  . Diverticulosis   . Spinal stenosis     with chronic LBP  . Anemia   . Vitamin D deficiency   . Osteoporosis   . UTI (lower urinary tract infection)   . Tachy-brady syndrome   . Varicose veins   . Fatigue   . History of RIND (reversible ischemic neurological deficit)   . Hypothyroidism   . Complication of anesthesia     "very sensitive to it; hard for me to wakeup"  . Breast  cancer     right  . Kidney stones   . Angina   . Pacemaker -Medtronic     DOI 2003/ Change out 2013  . UTI (lower urinary tract infection)   . Stage III chronic kidney disease   . Alzheimer's disease   . Diabetes mellitus without complication    Assessment: CC: SOB, syncope  PMH: OA, afib, HTN, GERD, diverticulosis, spinal stenosis, anemia, vit D def, hypothyroid, breast ca, CKD, PM  ID: Vanc/aztreonam D#1 for PNA - Afebrile, Scr + WBC 6.3 LA 4.58 SCr 1.66 with crcl ~ 20 ml/min  Vanc 7/17>> Aztreonam 7/17>>  Goal of Therapy:  Vancomycin trough level 15-20 mcg/ml  Plan:  - Vanc 1500mg  IV x 1, then 750 mg q24h - Aztreonam 2gm IV x 1, then 500 mg q8h - F/u clinical status, renal function, cultures, and VT prn  Levester Fresh, PharmD, [facility] Clinical Pharmacist Pager [phone] 03/30/2015 8:31 PM

## 2015-03-30 NOTE — ED Notes (Addendum)
Pt from home via [facility] with c/o syncope while trying to have a bowel movement, witnessed by home health aid and did not fall.  Pt reports feeling sick on her stomach all day, generalized pain.  Had a second syncopal episode while sitting in a wheel chair after movement.  On EMS arrival BP 60/30 HR 140's a-fib with RVR.  Given 550 ml NS bolus, BP increased to 100/70 and HR in the 80s.  Pt vomited x 1 with ems, given 4 mg Zofran.  Lung clear, pt denies SOB.  Last BP decreased again to 60/30, HR 90s-120s.  Pt alert on arrival to ED.  Normally on 3 L nasal cannula at home.  Notified Dr Audie Pinto on arrival of pt's EMS reports of hypotension and syncope.

## 2015-03-31 DIAGNOSIS — E039 Hypothyroidism, unspecified: Secondary | ICD-10-CM

## 2015-03-31 DIAGNOSIS — N179 Acute kidney failure, unspecified: Secondary | ICD-10-CM | POA: Diagnosis present

## 2015-03-31 DIAGNOSIS — N390 Urinary tract infection, site not specified: Secondary | ICD-10-CM

## 2015-03-31 DIAGNOSIS — N189 Chronic kidney disease, unspecified: Secondary | ICD-10-CM

## 2015-03-31 DIAGNOSIS — I959 Hypotension, unspecified: Secondary | ICD-10-CM

## 2015-03-31 DIAGNOSIS — R791 Abnormal coagulation profile: Secondary | ICD-10-CM

## 2015-03-31 LAB — GLUCOSE, CAPILLARY
GLUCOSE-CAPILLARY: 114 mg/dL — AB (ref 65–99)
Glucose-Capillary: 123 mg/dL — ABNORMAL HIGH (ref 65–99)
Glucose-Capillary: 119 mg/dL — ABNORMAL HIGH (ref 65–99)

## 2015-03-31 LAB — COMPREHENSIVE METABOLIC PANEL
ALK PHOS: 68 U/L (ref 38–126)
ALT: 17 U/L (ref 14–54)
AST: 31 U/L (ref 15–41)
Albumin: 2.3 g/dL — ABNORMAL LOW (ref 3.5–5.0)
Anion gap: 5 (ref 5–15)
BILIRUBIN TOTAL: 0.3 mg/dL (ref 0.3–1.2)
BUN: 23 mg/dL — AB (ref 6–20)
CALCIUM: 7.7 mg/dL — AB (ref 8.9–10.3)
CO2: 26 mmol/L (ref 22–32)
Chloride: 110 mmol/L (ref 101–111)
Creatinine, Ser: 1.6 mg/dL — ABNORMAL HIGH (ref 0.44–1.00)
GFR calc Af Amer: 31 mL/min — ABNORMAL LOW (ref >60)
GFR calc non Af Amer: 27 mL/min — ABNORMAL LOW (ref >60)
Glucose, Bld: 126 mg/dL — ABNORMAL HIGH (ref 65–99)
Potassium: 3.9 mmol/L (ref 3.5–5.1)
Sodium: 141 mmol/L (ref 135–145)
TOTAL PROTEIN: 4.7 g/dL — AB (ref 6.5–8.1)

## 2015-03-31 LAB — CREATININE, URINE, RANDOM: Creatinine, Urine: 65.91 mg/dL

## 2015-03-31 LAB — CBC
HCT: 31.1 % — ABNORMAL LOW (ref 36.0–46.0)
HEMOGLOBIN: 10.1 g/dL — AB (ref 12.0–15.0)
MCH: 29.6 pg (ref 26.0–34.0)
MCHC: 32.5 g/dL (ref 30.0–36.0)
MCV: 91.2 fL (ref 78.0–100.0)
PLATELETS: 170 10*3/uL (ref 150–400)
RBC: 3.41 MIL/uL — AB (ref 3.87–5.11)
RDW: 18 % — ABNORMAL HIGH (ref 11.5–15.5)
WBC: 13.4 10*3/uL — ABNORMAL HIGH (ref 4.0–10.5)

## 2015-03-31 LAB — IRON AND TIBC
Iron: 17 ug/dL — ABNORMAL LOW (ref 28–170)
SATURATION RATIOS: 7 % — AB (ref 10.4–31.8)
TIBC: 248 ug/dL — AB (ref 250–450)
UIBC: 231 ug/dL

## 2015-03-31 LAB — RETICULOCYTES
RBC.: 3.6 MIL/uL — AB (ref 3.87–5.11)
RETIC COUNT ABSOLUTE: 32.4 10*3/uL (ref 19.0–186.0)
Retic Ct Pct: 0.9 % (ref 0.4–3.1)

## 2015-03-31 LAB — FERRITIN: FERRITIN: 33 ng/mL (ref 11–307)

## 2015-03-31 LAB — MAGNESIUM: Magnesium: 1.8 mg/dL (ref 1.7–2.4)

## 2015-03-31 LAB — PROTIME-INR
INR: 3.91 — AB (ref 0.00–1.49)
Prothrombin Time: 37.3 seconds — ABNORMAL HIGH (ref 11.6–15.2)

## 2015-03-31 LAB — PROCALCITONIN: PROCALCITONIN: 55.76 ng/mL

## 2015-03-31 LAB — LACTIC ACID, PLASMA: Lactic Acid, Venous: 1.5 mmol/L (ref 0.5–2.0)

## 2015-03-31 LAB — SODIUM, URINE, RANDOM: Sodium, Ur: 47 mmol/L

## 2015-03-31 LAB — CORTISOL: Cortisol, Plasma: 15.3 ug/dL

## 2015-03-31 LAB — FOLATE

## 2015-03-31 LAB — VITAMIN B12: VITAMIN B 12: 7167 pg/mL — AB (ref 180–914)

## 2015-03-31 MED ORDER — INSULIN ASPART 100 UNIT/ML ~~LOC~~ SOLN
0.0000 [IU] | Freq: Three times a day (TID) | SUBCUTANEOUS | Status: DC
  Administered 2015-03-31: 1 [IU] via SUBCUTANEOUS
  Administered 2015-04-01 – 2015-04-02 (×2): 2 [IU] via SUBCUTANEOUS
  Administered 2015-04-03 – 2015-04-04 (×2): 1 [IU] via SUBCUTANEOUS

## 2015-03-31 MED ORDER — TRAZODONE HCL 50 MG PO TABS
50.0000 mg | ORAL_TABLET | Freq: Every day | ORAL | Status: DC
  Administered 2015-03-31 – 2015-04-03 (×4): 50 mg via ORAL
  Filled 2015-03-31 (×7): qty 1

## 2015-03-31 MED ORDER — ALBUTEROL SULFATE (2.5 MG/3ML) 0.083% IN NEBU: 2.5000 mg | INHALATION_SOLUTION | RESPIRATORY_TRACT | Status: DC | PRN

## 2015-03-31 MED ORDER — OXYCODONE HCL 5 MG PO TABS
5.0000 mg | ORAL_TABLET | Freq: Four times a day (QID) | ORAL | Status: DC | PRN
  Administered 2015-04-02 – 2015-04-03 (×4): 5 mg via ORAL
  Filled 2015-03-31 (×4): qty 1

## 2015-03-31 MED ORDER — SODIUM CHLORIDE 0.9 % IV SOLN
INTRAVENOUS | Status: DC
  Filled 2015-03-31: qty 1000

## 2015-03-31 MED ORDER — MAGNESIUM SULFATE 50 % IJ SOLN
3.0000 g | Freq: Once | INTRAMUSCULAR | Status: AC
  Administered 2015-03-31: 3 g via INTRAVENOUS
  Filled 2015-03-31: qty 6

## 2015-03-31 MED ORDER — LORATADINE 10 MG PO TABS
10.0000 mg | ORAL_TABLET | Freq: Every day | ORAL | Status: DC
  Administered 2015-03-31 – 2015-04-04 (×4): 10 mg via ORAL
  Filled 2015-03-31 (×5): qty 1

## 2015-03-31 MED ORDER — FERROUS SULFATE 325 (65 FE) MG PO TABS
325.0000 mg | ORAL_TABLET | Freq: Every day | ORAL | Status: DC
  Administered 2015-04-01 – 2015-04-04 (×3): 325 mg via ORAL
  Filled 2015-03-31 (×5): qty 1

## 2015-03-31 MED ORDER — FAMOTIDINE 20 MG PO TABS
20.0000 mg | ORAL_TABLET | Freq: Two times a day (BID) | ORAL | Status: DC
  Administered 2015-03-31 – 2015-04-01 (×3): 20 mg via ORAL
  Filled 2015-03-31 (×4): qty 1

## 2015-03-31 MED ORDER — SERTRALINE HCL 25 MG PO TABS
12.5000 mg | ORAL_TABLET | Freq: Every day | ORAL | Status: DC
  Administered 2015-03-31 – 2015-04-04 (×5): 12.5 mg via ORAL
  Filled 2015-03-31 (×5): qty 0.5

## 2015-03-31 NOTE — Progress Notes (Signed)
ANTICOAGULATION CONSULT NOTE - Follow Up Consult  Pharmacy Consult for Coumadin Indication: atrial fibrillation  Allergies  Allergen Reactions  . Ceftin [Cefuroxime Axetil] Shortness Of Breath and Itching  . Amiodarone Other (See Comments)    dizziness  . Micardis [Telmisartan] Other (See Comments)    Fatigue and back pain  . Plavix [Clopidogrel Bisulfate] Other (See Comments)    dizziness  . Benzyl Alcohol Other (See Comments)    Derivatives also   . Caffeine Rash  . Chlordiazepoxide-Clidinium Rash  . Codeine Phosphate Rash  . Conjugated Estrogens Rash  . Darvon Rash  . Dilaudid [Hydromorphone Hcl] Rash  . Dimetapp Cold-Allergy Rash  . Dyazide [Hydrochlorothiazide W-Triamterene] Rash  . Erythromycin Rash  . Estrogens Rash  . Fungizone [Amphotericin B] Other (See Comments)    Unsure if fungi allergy or amphotericin B allergy?  . Guaifenesin & Derivatives Rash  . Ibuprofen Rash  . Influenza Vaccines Other (See Comments)    unknown  . Iodine Rash  . Milk [Dairy] Other (See Comments)    unknown  . Nitroglycerin Other (See Comments)    Head ache   . Other Other (See Comments)    dog and cat dander, mold, mildew, fungi, dust, wool, soaps, chocolate, pineapple, beets, watermelon   . Penicillins Rash    Also penicillin G    . Petroleum Jelly [Petrolatum] Other (See Comments)    Lipsticks also  . Prednisone Rash  . Sulfamethoxazole Rash  . Tagamet [Cimetidine] Rash       . Talwin [Pentazocine] Rash  . Tape Other (See Comments)    unknown  . Zithromax [Azithromycin Dihydrate] Rash    Patient Measurements: Height: 5\' 2"  (157.5 cm) Weight: 166 lb 7.2 oz (75.5 kg) IBW/kg (Calculated) : 50.1  Vital Signs: Temp: 97.4 F (36.3 C) (07/18 0737) Temp Source: Axillary (07/18 0737) BP: 93/39 mmHg (07/18 0741) Pulse Rate: 82 (07/18 0737)  Labs:  Recent Labs  03/30/15 1450 03/30/15 1845 03/31/15 0311  HGB 14.0  --  10.1*  HCT 44.1  --  31.1*  PLT 221  --  170   LABPROT  --  36.4* 37.3*  INR  --  3.78* 3.91*  CREATININE 1.66*  --  1.60*  TROPONINI <0.03  --   --     Estimated Creatinine Clearance: 21.4 mL/min (by C-G formula based on Cr of 1.6).   Assessment: 5 YOF admitted for syncope. She is on chronic coumadin for afib, INR remains elevated at 3.91 this morning. She hasn't received coumadin since admission. Hgb 10.1, plt 170.   Goal of Therapy:  INR 2-3 Monitor platelets by anticoagulation protocol: Yes   Plan:  - Continue holding coumadin - f/u daily PT/INR  Maryanna Shape, PharmD, BCPS  Clinical Pharmacist  Pager: [phone]   03/31/2015,10:10 AM

## 2015-03-31 NOTE — Progress Notes (Signed)
[facility] PROGRESS NOTE  [name]:[id number] DOB: 17-Jan-1920 DOA: 03/30/2015 PCP: Abigail Miyamoto, MD  Assessment/Plan: #1 sepsis secondary to UTI Patient presenting with sepsis noted to be hypotensive as a leukocytosis of 13.4, noted on admission to have a respiratory rate of 38 and noted to be hypotensive on admission. Patient also noted to have a elevated lactic acid level of 4.58. Patient alert. Patient afebrile. Patient has been pancultured. cultures pending. C. difficile PCR was negative. Repeat lactic acid level. Check a pro calcitonin level. DC IV Flagyl. Continue empiric IV vancomycin and IV aztreonam. IV fluids. Supportive care.  #2 syncope Likely secondary to hypotension and possibly vasovagal as patient had syncopal episode while having a bowel movement. Patient noted to be hypotensive when EMS got there. Patient alert. Urinalysis consistent with UTI. Patient has been pancultured cultures are pending. Patient following commands. Patient with no focal neurological deficits. Continue gentle IV hydration. Continue empiric IV antibiotics. Follow.  #3 hypotension Likely secondary to volume depletion and UTI. C. difficile PCR was negative. Chest x-ray was negative. Patient has been pancultured cultures are pending. Will repeat a lactic acid level. Check a cortisol level. Check a pro calcitonin level. First set of troponin was negative. Blood pressure improving with hydration and IV antibiotics. Follow.  #4 recurrent UTI   patient with recurrent UTIs. Urine cultures pending. Urine culture from 12/17/2014 with Escherichia coli which was pansensitive. Patient on empiric IV vancomycin IV aztreonam. Follow.  #5 acute on chronic kidney disease stage III Likely secondary to prerenal azotemia as patient was noted to be hypotensive on admission in the setting of ACE inhibitor and diuretics. ACE inhibitor and diuretics on hold. Continue gentle hydration. Check a fractional  excretion of sodium. If no improvement in renal function will need to check a renal ultrasound. Follow.  #6 hypothyroidism TSH within normal limits. Continue home dose Synthroid.  #7 iron deficiency anemia Hemoglobin at 10.1 this morning from 14.0. Likely dilutional effect. Patient with no overt bleeding. Continue home regimen iron supplementation.  #8 hypomagnesemia Repeat mag level this morning. Replete.  #9 chronic atrial fibrillation with tachybradycardia syndrome status post PPM Stable. Cardizem on hold secondary to hypotension. INR is supratherapeutic will hold Coumadin for now. Follow.  #10 supratherapeutic INR Will hold Coumadin today. Per pharmacy.  #11 recently diagnosed type 2 diabetes mellitus Seems to be diet controlled as patient is not on any diabetic medications as outpatient. We'll place on a sliding scale insulin.  #12 prophylaxis Pepcid for GI prophylaxis. Patient was supratherapeutic INR. Once INR is therapeutic we'll resume Coumadin for DVT prophylaxis.  Code Status: DO NOT RESUSCITATE Family Communication: Updated patient and caretaker at bedside. Disposition Plan: Remain in the stepdown unit.   Consultants:  None  Procedures:  Chest x-ray 03/30/2015    Antibiotics:  IV vancomycin 03/30/2015   IV aztreonam 03/30/2015  IV Flagyl 03/30/2015 >>>> 03/31/2015   HPI/Subjective: Patient alert. She'll following commands. Patient denies any chest pain. Patient denies any shortness of breath. Patient denies any abdominal pain. Patient states she is hungry and hasn't eating.  Objective: Filed Vitals:   03/31/15 0741  BP: 93/39  Pulse:   Temp:   Resp:     Intake/Output Summary (Last 24 hours) at 03/31/15 0846 Last data filed at 03/31/15 0700  Gross per 24 hour  Intake 4212.5 ml  Output      0 ml  Net 4212.5 ml   Filed Weights   03/30/15 1545 03/30/15 1757  Weight:  74.844 kg (165 lb) 75.5 kg (166 lb 7.2 oz)    Exam:   General:   NAD  Cardiovascular: Irregularly irregular.  Respiratory: Clear to auscultation bilaterally.  Abdomen: Soft, nontender, nondistended, positive bowel sounds.  Musculoskeletal: No clubbing cyanosis or edema.  Data Reviewed: Basic Metabolic Panel:  Recent Labs Lab 03/30/15 1450 03/30/15 1845 03/31/15 0311  NA 136  --  141  K 3.7  --  3.9  CL 100*  --  110  CO2 23  --  26  GLUCOSE 201*  --  126*  BUN 20  --  23*  CREATININE 1.66*  --  1.60*  CALCIUM 8.6*  --  7.7*  MG  --  1.6*  --    Liver Function Tests:  Recent Labs Lab 03/30/15 1450 03/31/15 0311  AST 29 31  ALT 13* 17  ALKPHOS 95 68  BILITOT 0.5 0.3  PROT 6.2* 4.7*  ALBUMIN 3.1* 2.3*    Recent Labs Lab 03/30/15 1450  LIPASE 27   No results for input(s): AMMONIA in the last 168 hours. CBC:  Recent Labs Lab 03/30/15 1450 03/31/15 0311  WBC 6.3 13.4*  NEUTROABS 4.1  --   HGB 14.0 10.1*  HCT 44.1 31.1*  MCV 91.9 91.2  PLT 221 170   Cardiac Enzymes:  Recent Labs Lab 03/30/15 1450  TROPONINI <0.03   BNP (last 3 results)  Recent Labs  12/17/14 0656 12/18/14 1250  BNP 328.8* 691.4*    ProBNP (last 3 results) No results for input(s): PROBNP in the last 8760 hours.  CBG: No results for input(s): GLUCAP in the last 168 hours.  Recent Results (from the past 240 hour(s))  Clostridium Difficile by PCR (not at [facility])     Status: None   Collection Time: 03/30/15  5:22 PM  Result Value Ref Range Status   C difficile by pcr NEGATIVE NEGATIVE Final  MRSA PCR Screening     Status: None   Collection Time: 03/30/15  5:52 PM  Result Value Ref Range Status   MRSA by PCR NEGATIVE NEGATIVE Final    Comment:        The GeneXpert MRSA Assay (FDA approved for NASAL specimens only), is one component of a comprehensive MRSA colonization surveillance program. It is not intended to diagnose MRSA infection nor to guide or monitor treatment for MRSA infections.      Studies: Dg Chest Portable 1  View  03/30/2015   CLINICAL DATA:  Syncopal episode with hypotension  EXAM: PORTABLE CHEST - 1 VIEW  COMPARISON:  12/18/2014  FINDINGS: Cardiac shadow is again enlarged. A pacing device is again seen and stable. Elevation the right hemidiaphragm is again noted. No focal confluent infiltrate is seen. With improved aeration from the prior exam is noted.  IMPRESSION: Chronic changes without acute abnormality.   Electronically Signed   By: Inez Catalina M.D.   On: 03/30/2015 15:13    Scheduled Meds: . antiseptic oral rinse  7 mL Mouth Rinse q12n4p  . antiseptic oral rinse  7 mL Mouth Rinse BID  . aztreonam  500 mg Intravenous Q8H  . chlorhexidine  15 mL Mouth Rinse BID  . famotidine  20 mg Oral BID  . [START ON 04/01/2015] ferrous sulfate  325 mg Oral Q breakfast  . levothyroxine  37.5 mcg Oral QAC breakfast  . loratadine  10 mg Oral Daily  . sertraline  12.5 mg Oral Daily  . vancomycin  750 mg Intravenous Q24H  . Warfarin -  Pharmacist Dosing Inpatient   Does not apply q1800   Continuous Infusions: . sodium chloride 0.9 % 1,000 mL infusion 100 mL/hr at 03/31/15 7322    Principal Problem:   Sepsis Active Problems:   Syncope   Hypotension   Renal failure (ARF), acute on chronic   Atrial fibrillation   Hypertension   UTI (lower urinary tract infection)   Hypothyroidism   Anemia, iron deficiency   New onset type 2 diabetes mellitus   Stage III chronic kidney disease   Supratherapeutic INR    Time spent: [facility] Pager [phone]. If 7PM-7AM, please contact night-coverage at [email or web address], password [facility] 03/31/2015, 8:46 AM  LOS: 1 day

## 2015-03-31 NOTE — Evaluation (Signed)
Clinical/Bedside Swallow Evaluation Patient Details  Name: [name] MRN: [id number] Date of Birth: 01/27/1920  Today's Date: 03/31/2015 Time: SLP Start Time (ACUTE ONLY): 6761 SLP Stop Time (ACUTE ONLY): 0853 SLP Time Calculation (min) (ACUTE ONLY): 21 min  Past Medical History:  Past Medical History  Diagnosis Date  . Arthritis   . Chronic atrial fibrillation       chronic coumadin anticoagulation  . Hypertension   . Esophageal reflux     with paraesophageal hernia  . Diverticulosis   . Spinal stenosis     with chronic LBP  . Anemia   . Vitamin D deficiency   . Osteoporosis   . UTI (lower urinary tract infection)   . Tachy-brady syndrome   . Varicose veins   . Fatigue   . History of RIND (reversible ischemic neurological deficit)   . Hypothyroidism   . Complication of anesthesia     "very sensitive to it; hard for me to wakeup"  . Breast cancer     right  . Kidney stones   . Angina   . Pacemaker -[employer]     DOI 2003/ Change out 2013  . UTI (lower urinary tract infection)   . Stage III chronic kidney disease   . Alzheimer's disease   . Diabetes mellitus without complication    Past Surgical History:  Past Surgical History  Procedure Laterality Date  . Eye surgery      cataract bilateral  . Breast lumpectomy      right  . Tonsillectomy      "as a child"  . Appendectomy  1939  . Oophorectomy  1951    right; "had tumor there"  . Insert / replace / remove pacemaker Left     Dr Caryl Comes is cardiologist  . Pacemaker generator change N/A 11/01/2011    Procedure: PACEMAKER GENERATOR CHANGE;  Surgeon: Deboraha Sprang, MD;  Location: [facility] CATH LAB;  Service: Cardiovascular;  Laterality: N/A;   HPI:  81 y.o. female hx of AFIB, s/p pacemaker, HTN, anemia, hypothyroidism, CKD, DM, GERD, progressive dementia admitted following syncope x 2. Barium esophagram noted large hiatal hernia with narrowing just proximal to the hernia. Per MD report patient lives at home,  has caregiver that reports patient was complaining of generalized abdominal pain. CXR no focal confluent infiltrate is seen. With improved aeration from the prior exam is noted. BSE 12/18/14 recommended Dys 3 texture and thin liquids.   Assessment / Plan / Recommendation Clinical Impression  Pt and caregiver report intermittent esophageal globus sensation. Wet vocal quality noted once with thin likely due to speaking immediately after swallow. Swallow initiation appears timely without additional s/s aspiration and functional oral phase. Caregiver listed swallow and esophageal precautions she implements at home. Recommend regular texture and thin liquids, pills with applesauce, reflux precautions. No further ST needed at this time.    Aspiration Risk  Moderate    Diet Recommendation Age appropriate regular solids;Thin   Medication Administration: Whole meds with puree Compensations: Slow rate;Small sips/bites    Other  Recommendations Oral Care Recommendations: Oral care BID   Follow Up Recommendations       Frequency and Duration        Pertinent Vitals/Pain none         Swallow Study          Oral/Motor/Sensory Function Overall Oral Motor/Sensory Function: Appears within functional limits for tasks assessed   Ice Chips Ice chips: Not tested   Thin Liquid Thin  Liquid: Impaired Presentation: Cup;Straw Pharyngeal  Phase Impairments: Wet Vocal Quality    Nectar Thick Nectar Thick Liquid: Not tested   Honey Thick Honey Thick Liquid: Not tested   Puree Puree: Within functional limits   Solid   GO    Solid: Within functional limits       Houston Siren 03/31/2015,9:10 AM  Orbie Pyo Colvin Caroli.Ed [employer] [phone]

## 2015-03-31 NOTE — Progress Notes (Signed)
Utilization Review Completed.Donne Anon T7/18/2016

## 2015-04-01 LAB — PROTIME-INR
INR: 3.46 — ABNORMAL HIGH (ref 0.00–1.49)
Prothrombin Time: 34.1 seconds — ABNORMAL HIGH (ref 11.6–15.2)

## 2015-04-01 LAB — COMPREHENSIVE METABOLIC PANEL
ALK PHOS: 75 U/L (ref 38–126)
ALT: 17 U/L (ref 14–54)
AST: 27 U/L (ref 15–41)
Albumin: 2.7 g/dL — ABNORMAL LOW (ref 3.5–5.0)
Anion gap: 6 (ref 5–15)
BUN: 20 mg/dL (ref 6–20)
CO2: 26 mmol/L (ref 22–32)
Calcium: 8.1 mg/dL — ABNORMAL LOW (ref 8.9–10.3)
Chloride: 106 mmol/L (ref 101–111)
Creatinine, Ser: 1.41 mg/dL — ABNORMAL HIGH (ref 0.44–1.00)
GFR calc non Af Amer: 31 mL/min — ABNORMAL LOW (ref >60)
GFR, EST AFRICAN AMERICAN: 36 mL/min — AB (ref >60)
Glucose, Bld: 114 mg/dL — ABNORMAL HIGH (ref 65–99)
Potassium: 3.7 mmol/L (ref 3.5–5.1)
Sodium: 138 mmol/L (ref 135–145)
Total Bilirubin: 0.8 mg/dL (ref 0.3–1.2)
Total Protein: 5.4 g/dL — ABNORMAL LOW (ref 6.5–8.1)

## 2015-04-01 LAB — CBC WITH DIFFERENTIAL/PLATELET
BASOS ABS: 0 10*3/uL (ref 0.0–0.1)
Basophils Relative: 0 % (ref 0–1)
EOS ABS: 0 10*3/uL (ref 0.0–0.7)
EOS PCT: 0 % (ref 0–5)
HCT: 31.6 % — ABNORMAL LOW (ref 36.0–46.0)
Hemoglobin: 10 g/dL — ABNORMAL LOW (ref 12.0–15.0)
LYMPHS PCT: 12 % (ref 12–46)
Lymphs Abs: 0.9 10*3/uL (ref 0.7–4.0)
MCH: 28.9 pg (ref 26.0–34.0)
MCHC: 31.6 g/dL (ref 30.0–36.0)
MCV: 91.3 fL (ref 78.0–100.0)
Monocytes Absolute: 0.7 10*3/uL (ref 0.1–1.0)
Monocytes Relative: 8 % (ref 3–12)
Neutro Abs: 6.3 10*3/uL (ref 1.7–7.7)
Neutrophils Relative %: 80 % — ABNORMAL HIGH (ref 43–77)
Platelets: 166 10*3/uL (ref 150–400)
RBC: 3.46 MIL/uL — ABNORMAL LOW (ref 3.87–5.11)
RDW: 17.8 % — AB (ref 11.5–15.5)
WBC: 8 10*3/uL (ref 4.0–10.5)

## 2015-04-01 LAB — URINE CULTURE: Culture: >=100000

## 2015-04-01 LAB — GLUCOSE, CAPILLARY
GLUCOSE-CAPILLARY: 94 mg/dL (ref 65–99)
GLUCOSE-CAPILLARY: 181 mg/dL — AB (ref 65–99)
Glucose-Capillary: 110 mg/dL — ABNORMAL HIGH (ref 65–99)
Glucose-Capillary: 95 mg/dL (ref 65–99)

## 2015-04-01 LAB — MAGNESIUM: Magnesium: 2.2 mg/dL (ref 1.7–2.4)

## 2015-04-01 MED ORDER — FUROSEMIDE 10 MG/ML IJ SOLN
20.0000 mg | Freq: Once | INTRAMUSCULAR | Status: AC
  Administered 2015-04-01: 20 mg via INTRAVENOUS
  Filled 2015-04-01: qty 2

## 2015-04-01 MED ORDER — POLYVINYL ALCOHOL 1.4 % OP SOLN
1.0000 [drp] | OPHTHALMIC | Status: DC | PRN
  Administered 2015-04-01: 1 [drp] via OPHTHALMIC
  Filled 2015-04-01 (×3): qty 15

## 2015-04-01 MED ORDER — LEVOFLOXACIN IN D5W 250 MG/50ML IV SOLN
250.0000 mg | INTRAVENOUS | Status: DC
  Administered 2015-04-01 – 2015-04-02 (×2): 250 mg via INTRAVENOUS
  Filled 2015-04-01 (×4): qty 50

## 2015-04-01 MED ORDER — FAMOTIDINE 20 MG PO TABS
20.0000 mg | ORAL_TABLET | Freq: Every day | ORAL | Status: DC
  Administered 2015-04-03 – 2015-04-04 (×2): 20 mg via ORAL
  Filled 2015-04-01 (×3): qty 1

## 2015-04-01 MED ORDER — DILTIAZEM HCL ER COATED BEADS 180 MG PO CP24
180.0000 mg | ORAL_CAPSULE | Freq: Every day | ORAL | Status: DC
  Administered 2015-04-01 – 2015-04-04 (×4): 180 mg via ORAL
  Filled 2015-04-01 (×6): qty 1

## 2015-04-01 NOTE — Progress Notes (Signed)
ANTICOAGULATION CONSULT NOTE - Follow Up Consult  Pharmacy Consult for Coumadin Indication: atrial fibrillation  Allergies  Allergen Reactions  . Ceftin [Cefuroxime Axetil] Shortness Of Breath and Itching  . Amiodarone Other (See Comments)    dizziness  . Micardis [Telmisartan] Other (See Comments)    Fatigue and back pain  . Plavix [Clopidogrel Bisulfate] Other (See Comments)    dizziness  . Benzyl Alcohol Rash    Derivatives also   . Caffeine Rash  . Chlordiazepoxide-Clidinium Rash  . Chocolate Rash  . Codeine Phosphate Rash  . Conjugated Estrogens Rash  . Darvon Rash  . Dilaudid [Hydromorphone Hcl] Rash  . Dimetapp Cold-Allergy Rash  . Dyazide [Hydrochlorothiazide W-Triamterene] Rash  . Erythromycin Rash  . Estrogens Rash  . Fungizone [Amphotericin B] Rash    Unsure if fungi allergy or amphotericin B allergy?  . Guaifenesin & Derivatives Rash  . Ibuprofen Rash  . Influenza Vaccines Swelling  . Iodine Rash  . Milk [Dairy] Diarrhea  . Nitroglycerin Other (See Comments)    Head ache   . Other Other (See Comments)    dog and cat dander, mold, mildew, fungi, dust, causes sneezing and runny nose   . Penicillins Rash    Also penicillin G    . Petroleum Jelly [Petrolatum] Swelling    Lipsticks also (lip swelling)  . Pineapple Rash  . Prednisone Rash  . Strawberry Rash  . Sulfamethoxazole Rash  . Tagamet [Cimetidine] Rash       . Talwin [Pentazocine] Rash  . Tape Rash    Paper tape irritates her skin  . Zithromax [Azithromycin Dihydrate] Rash    Patient Measurements: Height: 5\' 2"  (157.5 cm) Weight: 166 lb 7.2 oz (75.5 kg) IBW/kg (Calculated) : 50.1  Vital Signs: Temp: 99.5 F (37.5 C) (07/19 0734) Temp Source: Axillary (07/19 0734) BP: 145/66 mmHg (07/19 0734) Pulse Rate: 128 (07/19 0734)  Labs:  Recent Labs  03/30/15 1450 03/30/15 1845 03/31/15 0311 04/01/15 0248  HGB 14.0  --  10.1* 10.0*  HCT 44.1  --  31.1* 31.6*  PLT 221  --  170 166   LABPROT  --  36.4* 37.3* 34.1*  INR  --  3.78* 3.91* 3.46*  CREATININE 1.66*  --  1.60* 1.41*  TROPONINI <0.03  --   --   --     Estimated Creatinine Clearance: 24.2 mL/min (by C-G formula based on Cr of 1.41).   Assessment: 66 YOF admitted for syncope. She is on chronic coumadin for afib, INR remains elevated at 3.46 this morning. She hasn't received coumadin since admission. Hgb 10, plt 166  Goal of Therapy:  INR 2-3 Monitor platelets by anticoagulation protocol: Yes   Plan:  - Continue holding coumadin - f/u daily PT/INR  Maryanna Shape, PharmD, BCPS  Clinical Pharmacist  Pager: [phone]   04/01/2015,9:21 AM

## 2015-04-01 NOTE — Progress Notes (Signed)
[facility] PROGRESS NOTE  ZOELLE MARKUS GDJ:[id number] DOB: 11/05/1919 DOA: 03/30/2015 PCP: Abigail Miyamoto, MD  Assessment/Plan: #1 sepsis secondary to UTI Patient presenting with sepsis noted to be hypotensive has a leukocytosis of 13.4, noted on admission to have a respiratory rate of 38 and noted to be hypotensive on admission. Patient also noted to have a elevated lactic acid level of 4.58. Lactic acid level trending down. Cortisol level at 15. Patient afebrile. WBC trending down. Patient has been pancultured cultures pending. C. difficile PCR was negative. Continue empiric IV vancomycin and IV aztreonam. NSL IV fluids. Supportive care.  #2 syncope Likely secondary to hypotension and possibly vasovagal as patient had syncopal episode while having a bowel movement. Patient noted to be hypotensive when EMS got there. Patient alert. Urinalysis consistent with UTI. Patient has been pancultured cultures are pending. Patient following commands. Patient with no focal neurological deficits. Continue gentle IV hydration. Continue empiric IV antibiotics. Follow.  #3 hypotension Likely secondary to volume depletion and UTI. C. difficile PCR was negative. Chest x-ray was negative. Patient has been pancultured cultures are pending. Cortisol level at 15. Lactic acid level trending down. First set of troponin was negative. Blood pressure improved with hydration and IV antibiotics. Saline lock IV fluids. Follow.  #4 recurrent UTI   patient with recurrent UTIs. Urine cultures pending. Urine culture from 12/17/2014 with Escherichia coli which was pansensitive. Continue IV vancomycin, IV aztreonam. Follow.  #5 acute on chronic kidney disease stage III Likely secondary to prerenal azotemia as patient was noted to be hypotensive on admission in the setting of ACE inhibitor and diuretics. ACE inhibitor and diuretics on hold. NSL IVF. FENA = 0.81%.Improved. Follow.  #6 hypothyroidism TSH 1.718.  Continue home dose Synthroid.  #7 iron deficiency anemia Hemoglobin at 10.0 this morning from 14.0. Likely dilutional effect. Patient with no overt bleeding. Continue home regimen iron supplementation.  #8 hypomagnesemia Repleted.  #9 chronic atrial fibrillation with tachybradycardia syndrome status post PPM CHADS2VASC2 score is 4. Patient heart rate is not elevated gone into the 120s. Patient's Cardizem was held yesterday secondary to hypotension. Will resume Cardizem for rate control. INR is supratherapeutic will hold Coumadin for now. Follow.  #10 supratherapeutic INR Will hold Coumadin today. Per pharmacy.  #11 recently diagnosed type 2 diabetes mellitus Seems to be diet controlled as patient is not on any diabetic medications as outpatient. CBGs have ranged between 114-119. Continue sliding scale insulin.  #12 prophylaxis Pepcid for GI prophylaxis. Patient with supratherapeutic INR currently at 3.46. Once INR is therapeutic will resume Coumadin for DVT prophylaxis.  Code Status: DO NOT RESUSCITATE Family Communication: Updated patient and caretaker at bedside. Disposition Plan: Remain in the stepdown unit.   Consultants:  None  Procedures:  Chest x-ray 03/30/2015    Antibiotics:  IV vancomycin 03/30/2015   IV aztreonam 03/30/2015  IV Flagyl 03/30/2015 >>>> 03/31/2015   HPI/Subjective: Patient alert. Patient states not feeling as well as she did yesterday. Patient denies any chest pain. No shortness of breath. No nausea or vomiting. No abdominal pain. Patient tolerating current diet.  Objective: Filed Vitals:   04/01/15 0734  BP: 145/66  Pulse: 128  Temp: 99.5 F (37.5 C)  Resp: 20    Intake/Output Summary (Last 24 hours) at 04/01/15 0913 Last data filed at 04/01/15 0852  Gross per 24 hour  Intake   2120 ml  Output      0 ml  Net   2120 ml   [employer]  03/30/15 1545 03/30/15 1757  Weight: 74.844 kg (165 lb) 75.5 kg (166 lb 7.2 oz)     Exam:   General:  NAD  Cardiovascular: Irregularly irregular.  Respiratory: Bibasilar crackles  Abdomen: Soft, nontender, nondistended, positive bowel sounds.  Musculoskeletal: No clubbing cyanosis or edema.  Data Reviewed: Basic Metabolic Panel:  Recent Labs Lab 03/30/15 1450 03/30/15 1845 03/31/15 0311 03/31/15 0822 04/01/15 0248  NA 136  --  141  --  138  K 3.7  --  3.9  --  3.7  CL 100*  --  110  --  106  CO2 23  --  26  --  26  GLUCOSE 201*  --  126*  --  114*  BUN 20  --  23*  --  20  CREATININE 1.66*  --  1.60*  --  1.41*  CALCIUM 8.6*  --  7.7*  --  8.1*  MG  --  1.6*  --  1.8 2.2   Liver Function Tests:  Recent Labs Lab 03/30/15 1450 03/31/15 0311 04/01/15 0248  AST 29 31 27   ALT 13* 17 17  ALKPHOS 95 68 75  BILITOT 0.5 0.3 0.8  PROT 6.2* 4.7* 5.4*  ALBUMIN 3.1* 2.3* 2.7*    Recent Labs Lab 03/30/15 1450  LIPASE 27   No results for input(s): AMMONIA in the last 168 hours. CBC:  Recent Labs Lab 03/30/15 1450 03/31/15 0311 04/01/15 0248  WBC 6.3 13.4* 8.0  NEUTROABS 4.1  --  6.3  HGB 14.0 10.1* 10.0*  HCT 44.1 31.1* 31.6*  MCV 91.9 91.2 91.3  PLT 221 170 166   Cardiac Enzymes:  Recent Labs Lab 03/30/15 1450  TROPONINI <0.03   BNP (last 3 results)  Recent Labs  12/17/14 0656 12/18/14 1250  BNP 328.8* 691.4*    ProBNP (last 3 results) No results for input(s): PROBNP in the last 8760 hours.  CBG:  Recent Labs Lab 03/31/15 1240 03/31/15 1751 03/31/15 2211 04/01/15 0832  GLUCAP 123* 114* 119* 110*    Recent Results (from the past 240 hour(s))  Culture, blood (routine x 2)     Status: None (Preliminary result)   Collection Time: 03/30/15  2:50 PM  Result Value Ref Range Status   Specimen Description BLOOD LEFT ARM  Final   Special Requests BOTTLES DRAWN AEROBIC AND ANAEROBIC 5CC  Final   Culture NO GROWTH < 24 HOURS  Final   Report Status PENDING  Incomplete  Culture, blood (routine x 2)     Status: None  (Preliminary result)   Collection Time: 03/30/15  3:09 PM  Result Value Ref Range Status   Specimen Description BLOOD LEFT FOREARM  Final   Special Requests BOTTLES DRAWN AEROBIC ONLY 4CC  Final   Culture NO GROWTH < 24 HOURS  Final   Report Status PENDING  Incomplete  Urine culture     Status: None (Preliminary result)   Collection Time: 03/30/15  3:22 PM  Result Value Ref Range Status   Specimen Description URINE, RANDOM  Final   Special Requests NONE  Final   Culture >=100,000 COLONIES/mL STAPHYLOCOCCUS AUREUS  Final   Report Status PENDING  Incomplete  Clostridium Difficile by PCR (not at [facility])     Status: None   Collection Time: 03/30/15  5:22 PM  Result Value Ref Range Status   C difficile by pcr NEGATIVE NEGATIVE Final  MRSA PCR Screening     Status: None   Collection Time: 03/30/15  5:52 PM  Result Value Ref Range Status   MRSA by PCR NEGATIVE NEGATIVE Final    Comment:        The GeneXpert MRSA Assay (FDA approved for NASAL specimens only), is one component of a comprehensive MRSA colonization surveillance program. It is not intended to diagnose MRSA infection nor to guide or monitor treatment for MRSA infections.      Studies: Dg Chest Portable 1 View  03/30/2015   CLINICAL DATA:  Syncopal episode with hypotension  EXAM: PORTABLE CHEST - 1 VIEW  COMPARISON:  12/18/2014  FINDINGS: Cardiac shadow is again enlarged. A pacing device is again seen and stable. Elevation the right hemidiaphragm is again noted. No focal confluent infiltrate is seen. With improved aeration from the prior exam is noted.  IMPRESSION: Chronic changes without acute abnormality.   Electronically Signed   By: Inez Catalina M.D.   On: 03/30/2015 15:13    Scheduled Meds: . antiseptic oral rinse  7 mL Mouth Rinse q12n4p  . antiseptic oral rinse  7 mL Mouth Rinse BID  . aztreonam  500 mg Intravenous Q8H  . chlorhexidine  15 mL Mouth Rinse BID  . diltiazem  180 mg Oral QAC breakfast  . famotidine   20 mg Oral BID  . ferrous sulfate  325 mg Oral Q breakfast  . furosemide  20 mg Intravenous Once  . insulin aspart  0-9 Units Subcutaneous TID WC  . levothyroxine  37.5 mcg Oral QAC breakfast  . loratadine  10 mg Oral Daily  . sertraline  12.5 mg Oral Daily  . traZODone  50 mg Oral QHS  . vancomycin  750 mg Intravenous Q24H  . Warfarin - Pharmacist Dosing Inpatient   Does not apply q1800   Continuous Infusions:    Principal Problem:   Sepsis Active Problems:   Syncope   Hypotension   Renal failure (ARF), acute on chronic   Atrial fibrillation   Hypertension   UTI (lower urinary tract infection)   Hypothyroidism   Anemia, iron deficiency   New onset type 2 diabetes mellitus   Stage III chronic kidney disease   Supratherapeutic INR    Time spent: [facility] Pager [phone]. If 7PM-7AM, please contact night-coverage at [email or web address], password [facility] 04/01/2015, 9:13 AM  LOS: 2 days

## 2015-04-01 NOTE — Care Management Important Message (Signed)
Important Message  Patient Details  Name: [name] MRN: [id number] Date of Birth: 1919/10/07   Medicare Important Message Given:  Yes-second notification given    Pricilla Handler 04/01/2015, 9:54 AM

## 2015-04-02 DIAGNOSIS — A4101 Sepsis due to Methicillin susceptible Staphylococcus aureus: Secondary | ICD-10-CM

## 2015-04-02 DIAGNOSIS — N179 Acute kidney failure, unspecified: Secondary | ICD-10-CM

## 2015-04-02 DIAGNOSIS — E119 Type 2 diabetes mellitus without complications: Secondary | ICD-10-CM

## 2015-04-02 DIAGNOSIS — N189 Chronic kidney disease, unspecified: Secondary | ICD-10-CM

## 2015-04-02 LAB — BASIC METABOLIC PANEL
Anion gap: 6 (ref 5–15)
BUN: 14 mg/dL (ref 6–20)
CALCIUM: 8.3 mg/dL — AB (ref 8.9–10.3)
CO2: 31 mmol/L (ref 22–32)
CREATININE: 1.12 mg/dL — AB (ref 0.44–1.00)
Chloride: 103 mmol/L (ref 101–111)
GFR calc non Af Amer: 41 mL/min — ABNORMAL LOW (ref >60)
GFR, EST AFRICAN AMERICAN: 48 mL/min — AB (ref >60)
GLUCOSE: 113 mg/dL — AB (ref 65–99)
Potassium: 3.4 mmol/L — ABNORMAL LOW (ref 3.5–5.1)
Sodium: 140 mmol/L (ref 135–145)

## 2015-04-02 LAB — PROTIME-INR
INR: 2.64 — ABNORMAL HIGH (ref 0.00–1.49)
PROTHROMBIN TIME: 27.8 s — AB (ref 11.6–15.2)

## 2015-04-02 LAB — CBC
HCT: 30.3 % — ABNORMAL LOW (ref 36.0–46.0)
HEMOGLOBIN: 9.9 g/dL — AB (ref 12.0–15.0)
MCH: 29.9 pg (ref 26.0–34.0)
MCHC: 32.7 g/dL (ref 30.0–36.0)
MCV: 91.5 fL (ref 78.0–100.0)
Platelets: 155 10*3/uL (ref 150–400)
RBC: 3.31 MIL/uL — ABNORMAL LOW (ref 3.87–5.11)
RDW: 17.2 % — AB (ref 11.5–15.5)
WBC: 6.2 10*3/uL (ref 4.0–10.5)

## 2015-04-02 LAB — GLUCOSE, CAPILLARY
GLUCOSE-CAPILLARY: 166 mg/dL — AB (ref 65–99)
GLUCOSE-CAPILLARY: 90 mg/dL (ref 65–99)
Glucose-Capillary: 106 mg/dL — ABNORMAL HIGH (ref 65–99)
Glucose-Capillary: 116 mg/dL — ABNORMAL HIGH (ref 65–99)

## 2015-04-02 LAB — HEMOGLOBIN A1C
Hgb A1c MFr Bld: 6.3 % — ABNORMAL HIGH (ref 4.8–5.6)
MEAN PLASMA GLUCOSE: 134 mg/dL

## 2015-04-02 LAB — PROCALCITONIN: Procalcitonin: 23.47 ng/mL

## 2015-04-02 MED ORDER — POTASSIUM CHLORIDE CRYS ER 20 MEQ PO TBCR
20.0000 meq | EXTENDED_RELEASE_TABLET | Freq: Two times a day (BID) | ORAL | Status: AC
  Administered 2015-04-02 (×2): 20 meq via ORAL
  Filled 2015-04-02 (×2): qty 1

## 2015-04-02 MED ORDER — WARFARIN SODIUM 1 MG PO TABS
1.0000 mg | ORAL_TABLET | Freq: Once | ORAL | Status: AC
  Administered 2015-04-02: 1 mg via ORAL
  Filled 2015-04-02: qty 1

## 2015-04-02 MED ORDER — FUROSEMIDE 40 MG PO TABS
40.0000 mg | ORAL_TABLET | Freq: Every day | ORAL | Status: DC
  Administered 2015-04-02 – 2015-04-04 (×3): 40 mg via ORAL
  Filled 2015-04-02 (×3): qty 1

## 2015-04-02 NOTE — Progress Notes (Signed)
Transfer Note:   Traveling Method: Bed  Transferring Unit: from unit 2C Mental Orientation: alert and oriented to self and place  Telemetry: Per MD orders; box #23 applied and CCMD notified  Assessment: Completed Skin: Warm, dry and intact. IV: Clean, dry and intact. Pain: Stated her stomach is sore  Tubes: N/A Safety Measures: Safety Fall Prevention Plan has been given, discussed and signed Admission: Completed 6 [place] Orientation: Patient has been orientated to the room, unit and staff.  Family: Pt's caregiver at bedside   Orders have been reviewed and implemented. Will continue to monitor the patient. Call light has been placed within reach and bed alarm has been activated.   Aurie Harroun [employer], RN [employer] [facility] Phone number: [phone]

## 2015-04-02 NOTE — Progress Notes (Signed)
Called and gave report to RN on 6E. Patient safely transported to [place] with RN and NT. Patient's caretaker at bedside.

## 2015-04-02 NOTE — Progress Notes (Signed)
ANTICOAGULATION CONSULT NOTE - Follow Up Consult  Pharmacy Consult for Coumadin Indication: atrial fibrillation  Allergies  Allergen Reactions  . Ceftin [Cefuroxime Axetil] Shortness Of Breath and Itching  . Amiodarone Other (See Comments)    dizziness  . Micardis [Telmisartan] Other (See Comments)    Fatigue and back pain  . Plavix [Clopidogrel Bisulfate] Other (See Comments)    dizziness  . Benzyl Alcohol Rash    Derivatives also   . Caffeine Rash  . Chlordiazepoxide-Clidinium Rash  . Chocolate Rash  . Codeine Phosphate Rash  . Conjugated Estrogens Rash  . Darvon Rash  . Dilaudid [Hydromorphone Hcl] Rash  . Dimetapp Cold-Allergy Rash  . Dyazide [Hydrochlorothiazide W-Triamterene] Rash  . Erythromycin Rash  . Estrogens Rash  . Fungizone [Amphotericin B] Rash    Unsure if fungi allergy or amphotericin B allergy?  . Guaifenesin & Derivatives Rash  . Ibuprofen Rash  . Influenza Vaccines Swelling  . Iodine Rash  . Milk [Dairy] Diarrhea  . Nitroglycerin Other (See Comments)    Head ache   . Other Other (See Comments)    dog and cat dander, mold, mildew, fungi, dust, causes sneezing and runny nose   . Penicillins Rash    Also penicillin G    . Petroleum Jelly [Petrolatum] Swelling    Lipsticks also (lip swelling)  . Pineapple Rash  . Prednisone Rash  . Strawberry Rash  . Sulfamethoxazole Rash  . Tagamet [Cimetidine] Rash       . Talwin [Pentazocine] Rash  . Tape Rash    Paper tape irritates her skin  . Zithromax [Azithromycin Dihydrate] Rash    Patient Measurements: Height: 5\' 2"  (157.5 cm) Weight: 166 lb 7.2 oz (75.5 kg) IBW/kg (Calculated) : 50.1  Vital Signs: Temp: 98.8 F (37.1 C) (07/20 0755) Temp Source: Oral (07/20 0755) BP: 142/90 mmHg (07/20 0755) Pulse Rate: 94 (07/20 0755)  Labs:  Recent Labs  03/30/15 1450  03/31/15 0311 04/01/15 0248 04/02/15 0243  HGB 14.0  --  10.1* 10.0* 9.9*  HCT 44.1  --  31.1* 31.6* 30.3*  PLT 221  --  170  166 155  LABPROT  --   < > 37.3* 34.1* 27.8*  INR  --   < > 3.91* 3.46* 2.64*  CREATININE 1.66*  --  1.60* 1.41* 1.12*  TROPONINI <0.03  --   --   --   --   < > = values in this interval not displayed.  Estimated Creatinine Clearance: 30.5 mL/min (by C-G formula based on Cr of 1.12).   Assessment: 31 YOF admitted for syncope. She is on chronic warfarin for afib, INR elevated on admission and no warfarin has been given since she has been here. INR now in therapeutic range at 2.64 this morning. Hgb 9.9, plts 155- low but stable. No bleeding noted.  Patient was started on Levaquin yesterday which can potentiate INR. Home warfarin dose 3mg  daily except none on Wednesdays and Saturdays.  Goal of Therapy:  INR 2-3 Monitor platelets by anticoagulation protocol: Yes   Plan:  -warfarin 1mg  po x1 tonight- cautious dosing with start of Levaquin -daily PT/INR -follow for s/s bleeding  Antwione Picotte D. Carmon Sahli, PharmD, BCPS Clinical Pharmacist Pager: [phone] 04/02/2015 8:52 AM

## 2015-04-02 NOTE — Progress Notes (Signed)
[facility] PROGRESS NOTE  [name] RCV:[id number] DOB: 1920-03-04 DOA: 03/30/2015 PCP: Abigail Miyamoto, MD  Assessment/Plan: 1 sepsis secondary to UTI Patient presenting with sepsis noted to be hypotensive has a leukocytosis of 13.4, noted on admission to have a respiratory rate of 38. Patient also noted to have a elevated lactic acid level of 4.58. Marland Kitchen Cortisol level at 15.  C. difficile PCR was negative.  Antibiotics change to levaquin. Day 4. Urine culture growing MMSA.Blood culture negative.   2 syncope Likely secondary to hypotension and possibly vasovagal as patient had syncopal episode while having a bowel movement. Patient noted to be hypotensive when EMS got there. Patient alert. Urinalysis consistent with UTI.    3 hypotension Likely secondary to volume depletion and UTI. C. difficile PCR was negative. Chest x-ray was negative. Patient has been pancultured cultures are pending. Cortisol level at 15. Lactic acid level trending down. First set of troponin was negative. Blood pressure improved with hydration and IV antibiotics. Saline lock IV fluids. Follow.  4 recurrent UTI   patient with recurrent UTIs. Urine cultures growing MSSA.  Continue with Levaquin  Day 4/7/   5 acute on chronic kidney disease stage III Likely secondary to prerenal azotemia as patient was noted to be hypotensive on admission in the setting of ACE inhibitor and diuretics. ACE inhibitor on hold Resume lasix.   6 hypothyroidism TSH 1.718. Continue home dose Synthroid.  7 iron deficiency anemia Continue home regimen iron supplementation. Follow hb trend.   8 hypomagnesemia Repleted.  9 chronic atrial fibrillation with tachybradycardia syndrome status post PPM CHADS2VASC2 score is 4. HR improved. Continue with Cardizem.   10 supratherapeutic INR Coumadin Per pharmacy. INR at 2.6  11 recently diagnosed type 2 diabetes mellitus Seems to be diet controlled as patient is not on any  diabetic medications as outpatient. CBGs have ranged between 114-119. Continue sliding scale insulin.  12 prophylaxis Pepcid for GI prophylaxis.    Code Status: DO NOT RESUSCITATE Family Communication: Updated patient and caretaker at bedside. Disposition Plan: transfer to telemetry. Home in 24 to 48 hours. Pt consult.    Consultants:  None  Procedures:  Chest x-ray 03/30/2015    Antibiotics:  IV vancomycin 03/30/2015 ---7-19  IV aztreonam 03/30/2015 7-29  IV Flagyl 03/30/2015 >>>> 03/31/2015  Levaquin.   HPI/Subjective: She doesn't know how she is feeling today. She is breathing well. Denies pain.   Objective: Filed Vitals:   04/02/15 0755  BP: 142/90  Pulse: 94  Temp: 98.8 F (37.1 C)  Resp: 16    Intake/Output Summary (Last 24 hours) at 04/02/15 0909 Last data filed at 04/01/15 2254  Gross per 24 hour  Intake    390 ml  Output      0 ml  Net    390 ml   Filed Weights   03/30/15 1545 03/30/15 1757  Weight: 74.844 kg (165 lb) 75.5 kg (166 lb 7.2 oz)    Exam:   General:  NAD  Cardiovascular: Irregularly irregular.  Respiratory: Bibasilar crackles  Abdomen: Soft, nontender, nondistended, positive bowel sounds.  Musculoskeletal: No clubbing cyanosis or edema.  Data Reviewed: Basic Metabolic Panel:  Recent Labs Lab 03/30/15 1450 03/30/15 1845 03/31/15 0311 03/31/15 0822 04/01/15 0248 04/02/15 0243  NA 136  --  141  --  138 140  K 3.7  --  3.9  --  3.7 3.4*  CL 100*  --  110  --  106 103  CO2 23  --  26  --  26 31  GLUCOSE 201*  --  126*  --  114* 113*  BUN 20  --  23*  --  20 14  CREATININE 1.66*  --  1.60*  --  1.41* 1.12*  CALCIUM 8.6*  --  7.7*  --  8.1* 8.3*  MG  --  1.6*  --  1.8 2.2  --    Liver Function Tests:  Recent Labs Lab 03/30/15 1450 03/31/15 0311 04/01/15 0248  AST 29 31 27   ALT 13* 17 17  ALKPHOS 95 68 75  BILITOT 0.5 0.3 0.8  PROT 6.2* 4.7* 5.4*  ALBUMIN 3.1* 2.3* 2.7*    Recent Labs Lab  03/30/15 1450  LIPASE 27   No results for input(s): AMMONIA in the last 168 hours. CBC:  Recent Labs Lab 03/30/15 1450 03/31/15 0311 04/01/15 0248 04/02/15 0243  WBC 6.3 13.4* 8.0 6.2  NEUTROABS 4.1  --  6.3  --   HGB 14.0 10.1* 10.0* 9.9*  HCT 44.1 31.1* 31.6* 30.3*  MCV 91.9 91.2 91.3 91.5  PLT 221 170 166 155   Cardiac Enzymes:  Recent Labs Lab 03/30/15 1450  TROPONINI <0.03   BNP (last 3 results)  Recent Labs  12/17/14 0656 12/18/14 1250  BNP 328.8* 691.4*    ProBNP (last 3 results) No results for input(s): PROBNP in the last 8760 hours.  CBG:  Recent Labs Lab 04/01/15 0832 04/01/15 1236 04/01/15 1632 04/01/15 2015 04/02/15 0754  GLUCAP 110* 94 181* 95 106*    Recent Results (from the past 240 hour(s))  Culture, blood (routine x 2)     Status: None (Preliminary result)   Collection Time: 03/30/15  2:50 PM  Result Value Ref Range Status   Specimen Description BLOOD LEFT ARM  Final   Special Requests BOTTLES DRAWN AEROBIC AND ANAEROBIC 5CC  Final   Culture NO GROWTH 2 DAYS  Final   Report Status PENDING  Incomplete  Culture, blood (routine x 2)     Status: None (Preliminary result)   Collection Time: 03/30/15  3:09 PM  Result Value Ref Range Status   Specimen Description BLOOD LEFT FOREARM  Final   Special Requests BOTTLES DRAWN AEROBIC ONLY 4CC  Final   Culture NO GROWTH 2 DAYS  Final   Report Status PENDING  Incomplete  Urine culture     Status: None   Collection Time: 03/30/15  3:22 PM  Result Value Ref Range Status   Specimen Description URINE, RANDOM  Final   Special Requests NONE  Final   Culture >=100,000 COLONIES/mL STAPHYLOCOCCUS AUREUS  Final   Report Status 04/01/2015 FINAL  Final   Organism ID, Bacteria STAPHYLOCOCCUS AUREUS  Final      Susceptibility   Staphylococcus aureus - MIC*    CIPROFLOXACIN <=0.5 SENSITIVE Sensitive     GENTAMICIN <=0.5 SENSITIVE Sensitive     NITROFURANTOIN <=16 SENSITIVE Sensitive     OXACILLIN 0.5  SENSITIVE Sensitive     TETRACYCLINE >=16 RESISTANT Resistant     VANCOMYCIN <=0.5 SENSITIVE Sensitive     TRIMETH/SULFA <=10 SENSITIVE Sensitive     CLINDAMYCIN <=0.25 SENSITIVE Sensitive     RIFAMPIN <=0.5 SENSITIVE Sensitive     Inducible Clindamycin NEGATIVE Sensitive     * >=100,000 COLONIES/mL STAPHYLOCOCCUS AUREUS  Clostridium Difficile by PCR (not at [facility])     Status: None   Collection Time: 03/30/15  5:22 PM  Result Value Ref Range Status   C difficile by pcr NEGATIVE  NEGATIVE Final  MRSA PCR Screening     Status: None   Collection Time: 03/30/15  5:52 PM  Result Value Ref Range Status   MRSA by PCR NEGATIVE NEGATIVE Final    Comment:        The GeneXpert MRSA Assay (FDA approved for NASAL specimens only), is one component of a comprehensive MRSA colonization surveillance program. It is not intended to diagnose MRSA infection nor to guide or monitor treatment for MRSA infections.      Studies: No results found.  Scheduled Meds: . antiseptic oral rinse  7 mL Mouth Rinse BID  . diltiazem  180 mg Oral QAC breakfast  . famotidine  20 mg Oral Daily  . ferrous sulfate  325 mg Oral Q breakfast  . insulin aspart  0-9 Units Subcutaneous TID WC  . levofloxacin (LEVAQUIN) IV  250 mg Intravenous Q24H  . levothyroxine  37.5 mcg Oral QAC breakfast  . loratadine  10 mg Oral Daily  . sertraline  12.5 mg Oral Daily  . traZODone  50 mg Oral QHS  . warfarin  1 mg Oral ONCE-1800  . Warfarin - Pharmacist Dosing Inpatient   Does not apply q1800   Continuous Infusions:    Principal Problem:   Sepsis Active Problems:   Atrial fibrillation   Hypertension   UTI (lower urinary tract infection)   Hypothyroidism   Anemia, iron deficiency   New onset type 2 diabetes mellitus   Stage III chronic kidney disease   Syncope   Hypotension   Supratherapeutic INR   Renal failure (ARF), acute on chronic    Time spent: 35 MINS    Niel Hummer A MD [facility] Pager  [phone]. If 7PM-7AM, please contact night-coverage at [email or web address], password [facility] 04/02/2015, 9:09 AM  LOS: 3 days

## 2015-04-03 DIAGNOSIS — I10 Essential (primary) hypertension: Secondary | ICD-10-CM

## 2015-04-03 DIAGNOSIS — D509 Iron deficiency anemia, unspecified: Secondary | ICD-10-CM

## 2015-04-03 LAB — BASIC METABOLIC PANEL
Anion gap: 6 (ref 5–15)
BUN: 14 mg/dL (ref 6–20)
CHLORIDE: 103 mmol/L (ref 101–111)
CO2: 31 mmol/L (ref 22–32)
Calcium: 8.6 mg/dL — ABNORMAL LOW (ref 8.9–10.3)
Creatinine, Ser: 1.05 mg/dL — ABNORMAL HIGH (ref 0.44–1.00)
GFR calc Af Amer: 52 mL/min — ABNORMAL LOW (ref >60)
GFR calc non Af Amer: 45 mL/min — ABNORMAL LOW (ref >60)
GLUCOSE: 107 mg/dL — AB (ref 65–99)
POTASSIUM: 3.8 mmol/L (ref 3.5–5.1)
Sodium: 140 mmol/L (ref 135–145)

## 2015-04-03 LAB — MAGNESIUM: Magnesium: 1.7 mg/dL (ref 1.7–2.4)

## 2015-04-03 LAB — PROTIME-INR
INR: 1.59 — ABNORMAL HIGH (ref 0.00–1.49)
Prothrombin Time: 19 seconds — ABNORMAL HIGH (ref 11.6–15.2)

## 2015-04-03 LAB — GLUCOSE, CAPILLARY
GLUCOSE-CAPILLARY: 120 mg/dL — AB (ref 65–99)
Glucose-Capillary: 114 mg/dL — ABNORMAL HIGH (ref 65–99)
Glucose-Capillary: 143 mg/dL — ABNORMAL HIGH (ref 65–99)
Glucose-Capillary: 114 mg/dL — ABNORMAL HIGH (ref 65–99)

## 2015-04-03 MED ORDER — ALBUTEROL SULFATE (2.5 MG/3ML) 0.083% IN NEBU
2.5000 mg | INHALATION_SOLUTION | Freq: Four times a day (QID) | RESPIRATORY_TRACT | Status: DC
  Administered 2015-04-03 (×2): 2.5 mg via RESPIRATORY_TRACT
  Filled 2015-04-03 (×2): qty 3

## 2015-04-03 MED ORDER — ALBUTEROL SULFATE (2.5 MG/3ML) 0.083% IN NEBU
2.5000 mg | INHALATION_SOLUTION | Freq: Two times a day (BID) | RESPIRATORY_TRACT | Status: DC
  Administered 2015-04-04: 2.5 mg via RESPIRATORY_TRACT
  Filled 2015-04-03: qty 3

## 2015-04-03 MED ORDER — WARFARIN SODIUM 3 MG PO TABS
3.0000 mg | ORAL_TABLET | Freq: Once | ORAL | Status: AC
  Administered 2015-04-03: 3 mg via ORAL
  Filled 2015-04-03: qty 1

## 2015-04-03 MED ORDER — LEVOFLOXACIN 250 MG PO TABS: 250.0000 mg | ORAL_TABLET | Freq: Every day | ORAL | Status: DC

## 2015-04-03 MED ORDER — MAGNESIUM SULFATE 2 GM/50ML IV SOLN
2.0000 g | Freq: Once | INTRAVENOUS | Status: AC
  Administered 2015-04-03: 2 g via INTRAVENOUS
  Filled 2015-04-03: qty 50

## 2015-04-03 MED ORDER — FUROSEMIDE 10 MG/ML IJ SOLN
20.0000 mg | Freq: Once | INTRAMUSCULAR | Status: AC
  Administered 2015-04-03: 20 mg via INTRAVENOUS
  Filled 2015-04-03: qty 2

## 2015-04-03 MED ORDER — CEPHALEXIN 250 MG PO CAPS
250.0000 mg | ORAL_CAPSULE | Freq: Two times a day (BID) | ORAL | Status: DC
  Administered 2015-04-03 – 2015-04-04 (×3): 250 mg via ORAL
  Filled 2015-04-03 (×5): qty 1

## 2015-04-03 NOTE — Evaluation (Signed)
Physical Therapy Evaluation Patient Details Name: [name] MRN: [id number] DOB: 1919-09-29 Today's Date: 04/03/2015   History of Present Illness  81 yo female admitted from home with onset of sepsis and has UTI with aspiration issues  Clinical Impression  Pt was able to be assisted to [facility] with some difficulty controlling sitting.  Her tendency is to push into extension which impacts both BSC and sitting on bed.  Her confusion was pre-existing but the level of assist she needs is greater.  Will recommend SNF to avoid pt/nursing injury.    Follow Up Recommendations SNF    Equipment Recommendations  None recommended by PT    Recommendations for Other Services       Precautions / Restrictions Precautions Precautions: Fall;Other (comment) (Telemetry) Precaution Comments: Pt is slow to follow instructions, has dementia Restrictions Weight Bearing Restrictions: No      Mobility  Bed Mobility Overal bed mobility: Needs Assistance;+2 for physical assistance;+ 2 for safety/equipment Bed Mobility: Supine to Sit;Sit to Supine     Supine to sit: Max assist Sit to supine: Max assist;+2 for physical assistance;+2 for safety/equipment   General bed mobility comments: pt cannot follow cues well to assist and tends to extend her trunk  Transfers Overall transfer level: Needs assistance Equipment used: Rolling walker (2 wheeled);1 person hand held assist Transfers: Sit to/from [facility] Sit to Stand: Mod assist;From elevated surface Stand pivot transfers: Mod assist;From elevated surface;+2 physical assistance;+2 safety/equipment       General transfer comment: needs reminding for placement of hands and cannot release surfaces so has to be controlled for location  Ambulation/Gait             General Gait Details: unable  Stairs            Wheelchair Mobility    Modified Rankin (Stroke Patients Only)       Balance Overall balance  assessment: Needs assistance Sitting-balance support: Feet supported Sitting balance-Leahy Scale: Poor   Postural control: Posterior lean Standing balance support: Bilateral upper extremity supported Standing balance-Leahy Scale: Zero                               Pertinent Vitals/Pain Pain Assessment: Faces Pain Score: 4  Faces Pain Scale: Hurts little more Pain Location: minor joint pains in knees and shoulders Pain Intervention(s): Limited activity within patient's tolerance;Monitored during session;Repositioned    Home Living Family/patient expects to be discharged to:: Private residence Living Arrangements: Non-relatives/Friends (Caregivers) Available Help at Discharge: Personal care attendant;Available 24 hours/day Type of Home: House Home Access: Ramped entrance     Home Layout: One level Home Equipment: Walker - 2 wheels      Prior Function Level of Independence: Needs assistance   Gait / Transfers Assistance Needed: previously minor help of one on RW then 2 with arm assist  ADL's / Homemaking Assistance Needed: caregivers  Comments: Assisted living resident     Hand Dominance        Extremity/Trunk Assessment   Upper Extremity Assessment: Generalized weakness           Lower Extremity Assessment: Generalized weakness      Cervical / Trunk Assessment: Kyphotic  Communication   Communication: HOH  Cognition Arousal/Alertness: Awake/alert Behavior During Therapy: Anxious;Flat affect Overall Cognitive Status: History of cognitive impairments - at baseline       Memory: Decreased recall of precautions;Decreased short-term memory  General Comments General comments (skin integrity, edema, etc.): Pt is up in bed with assistance to control her sitting balance, has poor ability to use UE's and flex trunk to help control.      Exercises        Assessment/Plan    PT Assessment Patient needs continued PT services   PT Diagnosis Difficulty walking;Generalized weakness   PT Problem List Decreased strength;Decreased range of motion;Decreased activity tolerance;Decreased balance;Decreased mobility;Decreased coordination;Decreased knowledge of use of DME;Decreased cognition;Decreased safety awareness;Decreased knowledge of precautions;Cardiopulmonary status limiting activity  PT Treatment Interventions DME instruction;Gait training;Functional mobility training;Therapeutic exercise;Therapeutic activities;Neuromuscular re-education;Balance training;Patient/family education   PT Goals (Current goals can be found in the Care Plan section) Acute Rehab PT Goals Patient Stated Goal: none stated PT Goal Formulation: With patient/family Time For Goal Achievement: 04/17/15 Potential to Achieve Goals: Good    Frequency Min 2X/week   Barriers to discharge Inaccessible home environment has ramped entrance    Co-evaluation               End of Session Equipment Utilized During Treatment: Gait belt Activity Tolerance: Patient tolerated treatment well;Patient limited by fatigue;Patient limited by lethargy Patient left: in bed;with call bell/phone within reach;with bed alarm set Nurse Communication: Mobility status         Time: 1415-1450 PT Time Calculation (min) (ACUTE ONLY): 35 min   Charges:   PT Evaluation $Initial PT Evaluation Tier I: 1 Procedure PT Treatments $Therapeutic Activity: 8-22 mins   PT G Codes:        Ramond Dial 04/24/15, 4:55 PM   Mee Hives, PT MS Acute Rehab Dept. Number: [phone] [id number] and [place] [phone]

## 2015-04-03 NOTE — Progress Notes (Signed)
ANTICOAGULATION CONSULT NOTE - Follow Up Consult  Pharmacy Consult for Coumadin Indication: atrial fibrillation  Allergies  Allergen Reactions  . Ceftin [Cefuroxime Axetil] Shortness Of Breath and Itching  . Amiodarone Other (See Comments)    dizziness  . Micardis [Telmisartan] Other (See Comments)    Fatigue and back pain  . Plavix [Clopidogrel Bisulfate] Other (See Comments)    dizziness  . Benzyl Alcohol Rash    Derivatives also   . Caffeine Rash  . Chlordiazepoxide-Clidinium Rash  . Chocolate Rash  . Codeine Phosphate Rash  . Conjugated Estrogens Rash  . Darvon Rash  . Dilaudid [Hydromorphone Hcl] Rash  . Dimetapp Cold-Allergy Rash  . Dyazide [Hydrochlorothiazide W-Triamterene] Rash  . Erythromycin Rash  . Estrogens Rash  . Fungizone [Amphotericin B] Rash    Unsure if fungi allergy or amphotericin B allergy?  . Guaifenesin & Derivatives Rash  . Ibuprofen Rash  . Influenza Vaccines Swelling  . Iodine Rash  . Milk [Dairy] Diarrhea  . Nitroglycerin Other (See Comments)    Head ache   . Other Other (See Comments)    dog and cat dander, mold, mildew, fungi, dust, causes sneezing and runny nose   . Penicillins Rash    Also penicillin G    . Petroleum Jelly [Petrolatum] Swelling    Lipsticks also (lip swelling)  . Pineapple Rash  . Prednisone Rash  . Strawberry Rash  . Sulfamethoxazole Rash  . Tagamet [Cimetidine] Rash       . Talwin [Pentazocine] Rash  . Tape Rash    Paper tape irritates her skin  . Zithromax [Azithromycin Dihydrate] Rash    Patient Measurements: Height: 5\' 2"  (157.5 cm) Weight: 169 lb 8.5 oz (76.9 kg) IBW/kg (Calculated) : 50.1  Vital Signs: Temp: 98.3 F (36.8 C) (07/21 0603) Temp Source: Oral (07/21 0603) BP: 126/85 mmHg (07/21 0603) Pulse Rate: 93 (07/21 0603)  Labs:  Recent Labs  04/01/15 0248 04/02/15 0243 04/03/15 0453  HGB 10.0* 9.9*  --   HCT 31.6* 30.3*  --   PLT 166 155  --   LABPROT 34.1* 27.8* 19.0*  INR  3.46* 2.64* 1.59*  CREATININE 1.41* 1.12* 1.05*    Estimated Creatinine Clearance: 32.8 mL/min (by C-G formula based on Cr of 1.05).   Assessment: 69 YOF admitted for syncope. She is on chronic warfarin for afib, INR elevated on admission and no warfarin was given inpatient until yesterday 04/02/15.  INR has decreased to subtherapeutic range at 1.59 this morning after coumadin held x3 days (7/17-7/19) due to supratherapeutic INR (3.91).  Yesterday the Hgb was 9.9, plts 155- low but stable. No bleeding noted. Ate 85% lunch on 7/19 & 7/20, no other meals percentage recorded. INR trend 3.91>3.46>2.64, today 1.59  Levaquin for UTI was discontinued today 7/21 & changed to oral cephalexin. The Levaquin which began on 7/19 ,can potentiate INR ---levofloxacin discontinued.  Home warfarin dose 3mg  daily except NONE on Wednesdays and Sundays. Last taken on 7/16 PTA.   Goal of Therapy:  INR 2-3 Monitor platelets by anticoagulation protocol: Yes   Plan:  -warfarin 3 mg po x1 tonight- -daily PT/INR -follow for s/s bleeding  Thank you for allowing pharmacy to be part of this patients care team. Nicole Cella, RPh Clinical Pharmacist Pager: [phone] 04/03/2015 2:06 PM

## 2015-04-03 NOTE — Progress Notes (Signed)
[facility] PROGRESS NOTE  [name] PFX:[id number] DOB: 1919/10/17 DOA: 03/30/2015 PCP: Abigail Miyamoto, MD  Assessment/Plan: 1 sepsis secondary to UTI Patient presenting with sepsis noted to be hypotensive has a leukocytosis of 13.4, noted on admission to have a respiratory rate of 38. Patient also noted to have a elevated lactic acid level of 4.58. Marland Kitchen Cortisol level at 15.  C. difficile PCR was negative.  Antibiotics change to keflex due to prolong QT.  Day 5. Urine culture growing MMSA. Blood culture negative.   2 syncope Likely secondary to hypotension and possibly vasovagal as patient had syncopal episode while having a bowel movement. Patient noted to be hypotensive when EMS got there. Patient alert. Urinalysis consistent with UTI.    3 hypotension Likely secondary to volume depletion and UTI. C. difficile PCR was negative. Chest x-ray was negative. Patient has been pancultured cultures are pending. Cortisol level at 15. Lactic acid level trending down. First set of troponin was negative. Blood pressure improved with hydration and IV antibiotics. Saline lock IV fluids. Follow.  4 recurrent UTI   patient with recurrent UTIs. Urine cultures growing MSSA.  Continue with antibiotics.  Day 5/7/   5 acute on chronic kidney disease stage III Likely secondary to prerenal azotemia as patient was noted to be hypotensive on admission in the setting of ACE inhibitor and diuretics. ACE inhibitor on hold Follow on  lasix.   6 hypothyroidism TSH 1.718. Continue home dose Synthroid.  7 iron deficiency anemia Continue home regimen iron supplementation. Follow hb trend.   8 hypomagnesemia Repleted.  9 chronic atrial fibrillation with tachybradycardia syndrome status post PPM CHADS2VASC2 score is 4. HR improved. Continue with Cardizem.   10 supratherapeutic INR Coumadin Per pharmacy. INR at 1.5   11 recently diagnosed type 2 diabetes mellitus Seems to be diet controlled as  patient is not on any diabetic medications as outpatient. CBGs have ranged between 114-119. Continue sliding scale insulin.  12 prophylaxis Pepcid for GI prophylaxis.   13-Acute on chronic diastolic dysfunction;  Lasix resume 7-20 Complaining of dyspnea today. Will order schedule nebulizer. Will give extra dose of IV lasix.   Code Status: DO NOT RESUSCITATE Family Communication: Updated patient and caretaker at bedside. Disposition Plan: transfer to telemetry. Home in 24 to 48 hours. Pt consult.    Consultants:  None  Procedures:  Chest x-ray 03/30/2015    Antibiotics:  IV vancomycin 03/30/2015 ---7-19  IV aztreonam 03/30/2015 7-29  IV Flagyl 03/30/2015 >>>> 03/31/2015  Levaquin.   HPI/Subjective: She relates dyspnea this am. Think is from allergy, her window was open.    Objective: Filed Vitals:   04/03/15 0603  BP: 126/85  Pulse: 93  Temp: 98.3 F (36.8 C)  Resp: 22    Intake/Output Summary (Last 24 hours) at 04/03/15 1428 Last data filed at 04/03/15 0614  Gross per 24 hour  Intake    410 ml  Output     51 ml  Net    359 ml   Filed Weights   03/30/15 1545 03/30/15 1757 04/02/15 2040  Weight: 74.844 kg (165 lb) 75.5 kg (166 lb 7.2 oz) 76.9 kg (169 lb 8.5 oz)    Exam:   General:  NAD  Cardiovascular: Irregularly irregular.  Respiratory: Bibasilar crackles  Abdomen: Soft, nontender, nondistended, positive bowel sounds.  Musculoskeletal: No clubbing cyanosis or edema.  Data Reviewed: Basic Metabolic Panel:  Recent Labs Lab 03/30/15 1450 03/30/15 1845 03/31/15 3299 03/31/15 2426 04/01/15 0248 04/02/15 8341  04/03/15 0453  NA 136  --  141  --  138 140 140  K 3.7  --  3.9  --  3.7 3.4* 3.8  CL 100*  --  110  --  106 103 103  CO2 23  --  26  --  26 31 31   GLUCOSE 201*  --  126*  --  114* 113* 107*  BUN 20  --  23*  --  20 14 14   CREATININE 1.66*  --  1.60*  --  1.41* 1.12* 1.05*  CALCIUM 8.6*  --  7.7*  --  8.1* 8.3* 8.6*  MG  --   1.6*  --  1.8 2.2  --  1.7   Liver Function Tests:  Recent Labs Lab 03/30/15 1450 03/31/15 0311 04/01/15 0248  AST 29 31 27   ALT 13* 17 17  ALKPHOS 95 68 75  BILITOT 0.5 0.3 0.8  PROT 6.2* 4.7* 5.4*  ALBUMIN 3.1* 2.3* 2.7*    Recent Labs Lab 03/30/15 1450  LIPASE 27   No results for input(s): AMMONIA in the last 168 hours. CBC:  Recent Labs Lab 03/30/15 1450 03/31/15 0311 04/01/15 0248 04/02/15 0243  WBC 6.3 13.4* 8.0 6.2  NEUTROABS 4.1  --  6.3  --   HGB 14.0 10.1* 10.0* 9.9*  HCT 44.1 31.1* 31.6* 30.3*  MCV 91.9 91.2 91.3 91.5  PLT 221 170 166 155   Cardiac Enzymes:  Recent Labs Lab 03/30/15 1450  TROPONINI <0.03   BNP (last 3 results)  Recent Labs  12/17/14 0656 12/18/14 1250  BNP 328.8* 691.4*    ProBNP (last 3 results) No results for input(s): PROBNP in the last 8760 hours.  CBG:  Recent Labs Lab 04/02/15 1217 04/02/15 1645 04/02/15 2038 04/03/15 0736 04/03/15 1131  GLUCAP 166* 90 116* 114* 120*    Recent Results (from the past 240 hour(s))  Culture, blood (routine x 2)     Status: None (Preliminary result)   Collection Time: 03/30/15  2:50 PM  Result Value Ref Range Status   Specimen Description BLOOD LEFT ARM  Final   Special Requests BOTTLES DRAWN AEROBIC AND ANAEROBIC 5CC  Final   Culture NO GROWTH 3 DAYS  Final   Report Status PENDING  Incomplete  Culture, blood (routine x 2)     Status: None (Preliminary result)   Collection Time: 03/30/15  3:09 PM  Result Value Ref Range Status   Specimen Description BLOOD LEFT FOREARM  Final   Special Requests BOTTLES DRAWN AEROBIC ONLY 4CC  Final   Culture NO GROWTH 3 DAYS  Final   Report Status PENDING  Incomplete  Urine culture     Status: None   Collection Time: 03/30/15  3:22 PM  Result Value Ref Range Status   Specimen Description URINE, RANDOM  Final   Special Requests NONE  Final   Culture >=100,000 COLONIES/mL STAPHYLOCOCCUS AUREUS  Final   Report Status 04/01/2015 FINAL   Final   Organism ID, Bacteria STAPHYLOCOCCUS AUREUS  Final      Susceptibility   Staphylococcus aureus - MIC*    CIPROFLOXACIN <=0.5 SENSITIVE Sensitive     GENTAMICIN <=0.5 SENSITIVE Sensitive     NITROFURANTOIN <=16 SENSITIVE Sensitive     OXACILLIN 0.5 SENSITIVE Sensitive     TETRACYCLINE >=16 RESISTANT Resistant     VANCOMYCIN <=0.5 SENSITIVE Sensitive     TRIMETH/SULFA <=10 SENSITIVE Sensitive     CLINDAMYCIN <=0.25 SENSITIVE Sensitive     RIFAMPIN <=0.5 SENSITIVE  Sensitive     Inducible Clindamycin NEGATIVE Sensitive     * >=100,000 COLONIES/mL STAPHYLOCOCCUS AUREUS  Clostridium Difficile by PCR (not at [facility])     Status: None   Collection Time: 03/30/15  5:22 PM  Result Value Ref Range Status   C difficile by pcr NEGATIVE NEGATIVE Final  MRSA PCR Screening     Status: None   Collection Time: 03/30/15  5:52 PM  Result Value Ref Range Status   MRSA by PCR NEGATIVE NEGATIVE Final    Comment:        The GeneXpert MRSA Assay (FDA approved for NASAL specimens only), is one component of a comprehensive MRSA colonization surveillance program. It is not intended to diagnose MRSA infection nor to guide or monitor treatment for MRSA infections.      Studies: No results found.  Scheduled Meds: . albuterol  2.5 mg Nebulization Q6H  . antiseptic oral rinse  7 mL Mouth Rinse BID  . cephALEXin  250 mg Oral Q12H  . diltiazem  180 mg Oral QAC breakfast  . famotidine  20 mg Oral Daily  . ferrous sulfate  325 mg Oral Q breakfast  . furosemide  20 mg Intravenous Once  . furosemide  40 mg Oral Daily  . insulin aspart  0-9 Units Subcutaneous TID WC  . levothyroxine  37.5 mcg Oral QAC breakfast  . loratadine  10 mg Oral Daily  . magnesium sulfate 1 - 4 g bolus IVPB  2 g Intravenous Once  . sertraline  12.5 mg Oral Daily  . traZODone  50 mg Oral QHS  . warfarin  3 mg Oral ONCE-1800  . Warfarin - Pharmacist Dosing Inpatient   Does not apply q1800   Continuous Infusions:     Principal Problem:   Sepsis Active Problems:   Atrial fibrillation   Hypertension   UTI (lower urinary tract infection)   Hypothyroidism   Anemia, iron deficiency   New onset type 2 diabetes mellitus   Stage III chronic kidney disease   Syncope   Hypotension   Supratherapeutic INR   Renal failure (ARF), acute on chronic    Time spent: 35 MINS    Niel Hummer A MD [facility] Pager [phone]. If 7PM-7AM, please contact night-coverage at [email or web address], password [facility] 04/03/2015, 2:28 PM  LOS: 4 days

## 2015-04-04 LAB — BASIC METABOLIC PANEL
Anion gap: 10 (ref 5–15)
BUN: 14 mg/dL (ref 6–20)
CHLORIDE: 99 mmol/L — AB (ref 101–111)
CO2: 31 mmol/L (ref 22–32)
Calcium: 8.3 mg/dL — ABNORMAL LOW (ref 8.9–10.3)
Creatinine, Ser: 1.13 mg/dL — ABNORMAL HIGH (ref 0.44–1.00)
GFR calc Af Amer: 47 mL/min — ABNORMAL LOW (ref >60)
GFR, EST NON AFRICAN AMERICAN: 41 mL/min — AB (ref >60)
Glucose, Bld: 102 mg/dL — ABNORMAL HIGH (ref 65–99)
POTASSIUM: 3.5 mmol/L (ref 3.5–5.1)
Sodium: 140 mmol/L (ref 135–145)

## 2015-04-04 LAB — CULTURE, BLOOD (ROUTINE X 2)
CULTURE: NO GROWTH
Culture: NO GROWTH

## 2015-04-04 LAB — PROTIME-INR
INR: 1.36 (ref 0.00–1.49)
Prothrombin Time: 16.9 seconds — ABNORMAL HIGH (ref 11.6–15.2)

## 2015-04-04 LAB — GLUCOSE, CAPILLARY
GLUCOSE-CAPILLARY: 132 mg/dL — AB (ref 65–99)
Glucose-Capillary: 105 mg/dL — ABNORMAL HIGH (ref 65–99)

## 2015-04-04 MED ORDER — WARFARIN SODIUM 1 MG PO TABS: ORAL_TABLET | ORAL | Status: DC

## 2015-04-04 MED ORDER — ALBUTEROL SULFATE (2.5 MG/3ML) 0.083% IN NEBU: 2.5000 mg | INHALATION_SOLUTION | RESPIRATORY_TRACT | Status: DC | PRN

## 2015-04-04 MED ORDER — WARFARIN SODIUM 2 MG PO TABS
2.0000 mg | ORAL_TABLET | Freq: Once | ORAL | Status: DC
  Filled 2015-04-04: qty 1

## 2015-04-04 MED ORDER — CEPHALEXIN 250 MG PO CAPS: 250.0000 mg | ORAL_CAPSULE | Freq: Two times a day (BID) | ORAL | Status: DC

## 2015-04-04 NOTE — Care Management Important Message (Signed)
Important Message  Patient Details  Name: [name] MRN: [id number] Date of Birth: Dec 19, 1919   Medicare Important Message Given:  Yes-third notification given    Pricilla Handler 04/04/2015, 12:29 PM

## 2015-04-04 NOTE — Evaluation (Signed)
Occupational Therapy Evaluation Patient Details Name: [name] MRN: [id number] DOB: October 31, 1919 Today's Date: 04/04/2015    History of Present Illness 81 yo female admitted from home with onset of sepsis and has UTI with aspiration issues   Clinical Impression   Pt's caregiver in room at time of assessment.  Reports pt is dependent in all ADL and IADL and mobility with her walker at baseline with the exception of eating and some grooming.  Plan is for pt to go home today with around the clock caregivers. No further OT needs.    Follow Up Recommendations  No OT follow up;Supervision/Assistance - 24 hour    Equipment Recommendations  None recommended by OT    Recommendations for Other Services       Precautions / Restrictions Precautions Precautions: Fall Precaution Comments: Pt is slow to follow instructions, has dementia Restrictions Weight Bearing Restrictions: No      Mobility Bed Mobility   Bed Mobility: Rolling Rolling: Mod assist   Supine to sit: Max assist Sit to supine: Max assist   General bed mobility comments: difficulty sequencing and following commands  Transfers                 General transfer comment: did not perform    Balance Overall balance assessment: Needs assistance Sitting-balance support: Feet supported Sitting balance-Leahy Scale: Poor   Postural control: Posterior lean                                  ADL Overall ADL's : At baseline                                             Vision     Perception     Praxis      Pertinent Vitals/Pain Pain Assessment: No/denies pain     Hand Dominance Right   Extremity/Trunk Assessment Upper Extremity Assessment Upper Extremity Assessment: Generalized weakness (L weaker than R from previous stroke)   Lower Extremity Assessment Lower Extremity Assessment: Defer to PT evaluation   Cervical / Trunk Assessment Cervical / Trunk  Assessment: Kyphotic   Communication Communication Communication: HOH   Cognition Arousal/Alertness: Awake/alert Behavior During Therapy: WFL for tasks assessed/performed Overall Cognitive Status: History of cognitive impairments - at baseline       Memory: Decreased recall of precautions;Decreased short-term memory             General Comments       Exercises       Shoulder Instructions      Home Living Family/patient expects to be discharged to:: Private residence Living Arrangements: Non-relatives/Friends (4 caregivers around the clock) Available Help at Discharge: Personal care attendant;Available 24 hours/day Type of Home: Apartment Home Access: Ramped entrance     Home Layout: One level     Bathroom Shower/Tub: Teacher, early years/pre: Standard (keep 3 in 1 over toilet)     Home Equipment: Walker - 2 wheels;Walker - 4 wheels;Bedside commode;Grab bars - tub/shower;Wheelchair - manual;Adaptive equipment;Hand held Nature conservation officer: Reacher        Prior Functioning/Environment Level of Independence: Needs assistance  Gait / Transfers Assistance Needed: previously minor help of one on RW then 2 with arm assist ADL's / Homemaking Assistance Needed: assisted for bathing, dressing,  toileting,some grooming, can self feed, total assist for IADL, pt goes to beauty shop weekly        OT Diagnosis:     OT Problem List:     OT Treatment/Interventions:      OT Goals(Current goals can be found in the care plan section) Acute Rehab OT Goals Patient Stated Goal: go home to her own bed  OT Frequency:     Barriers to D/C:            Co-evaluation              End of Session Equipment Utilized During Treatment: Oxygen (2L) Nurse Communication: Other (comment) (needs 02 for drive home)  Activity Tolerance: Patient tolerated treatment well Patient left: in bed;with call bell/phone within reach;with family/visitor  present   Time: 9741-6384 OT Time Calculation (min): 19 min Charges:  OT General Charges $OT Visit: 1 Procedure OT Evaluation $Initial OT Evaluation Tier I: 1 Procedure G-Codes:    Malka So 04/04/2015, 10:16 AM  [phone]

## 2015-04-04 NOTE — Care Management Note (Signed)
Case Management Note  Patient Details  Name: [name] MRN: [id number] Date of Birth: 1920/03/12  Subjective/Objective:            CM following for progression and d/c planning.        Action/Plan: 04/04/2015 Pt will d/c to home with caregivers, pt has home oxygen and DME. Caregives are privately paid. No other needs identified.  Expected Discharge Date:  04/04/2015             Expected Discharge Plan:  Home/Self Care  In-House Referral:  NA  Discharge planning Services  NA  Post Acute Care Choice:  NA Choice offered to:  NA  DME Arranged:    DME Agency:     HH Arranged:    HH Agency:     Status of Service:     Medicare Important Message Given:  Yes-second notification given Date Medicare IM Given:    Medicare IM give by:    Date Additional Medicare IM Given:    Additional Medicare Important Message give by:     If discussed at [place] of Stay Meetings, dates discussed:    Additional Comments:  Adron Bene, RN 04/04/2015, 11:15 AM

## 2015-04-04 NOTE — Discharge Summary (Signed)
Physician Discharge Summary  [name]:[id number] DOB: 12-21-1919 DOA: 03/30/2015  PCP: Abigail Miyamoto, MD  Admit date: 03/30/2015 Discharge date: 04/04/2015  Time spent: 35 minutes  Recommendations for Outpatient Follow-up:  Needs INR 7-25, adjust coumadin as needed  Need b-met to follow renal function.   Discharge Diagnoses:    Sepsis   Atrial fibrillation   Hypertension   UTI (lower urinary tract infection)   Hypothyroidism   Anemia, iron deficiency   New onset type 2 diabetes mellitus   Stage III chronic kidney disease   Syncope   Hypotension   Supratherapeutic INR   Renal failure (ARF), acute on chronic   Discharge Condition: stable  Diet recommendation: heart hjealthy  Filed Weights   04/02/15 2040 04/03/15 2108 04/04/15 0655  Weight: 76.9 kg (169 lb 8.5 oz) 76.8 kg (169 lb 5 oz) 78.9 kg (173 lb 15.1 oz)    History of present illness:  [name] is a 81 y.o. female  hx of AFIB on coumadin, s/p pacemaker, HTN, anemia, hypothyroidism, CKD, progressive dementia, presenting to the ED following syncope x2. Patient lives at home, has caregiver that reports patient was complaining of generalized abdominal pain earlier today and felt she needed to have a bowel movement. Caregiver assisted patient to bathroom where she had a syncopal episode on the toilet. Caregiver was able to arouse her and put her in the wheelchair where she had another syncopal episode. EMS was called, hypotensive on their arrival. She began vomiting en route to ED. Patient does require supplemental oxygen at all times, 3L at baseline. Patient is DNR.  ED course: cxr no acute findings, labs with lactic acidosis, ua ? Uti, she was given ns bolus 2.5liter, bp stabilized, she was also given iv vanc and aztreonam for sepsis, hospitalist called to admit the patient.  Upon entering patient's room, patient is lethargic, does open eyes to voice and follow commend, patient is having  loose smelling stool. per caregiver, patient has "frequent uti" needing frequent abx, last received levaquin in 01/2015,patient last bm was formed. Per caregiver patient was in her usual state of health yesterday evening, but developed ab pain, vomiting today, syncope x2, no fall.  patient normally able to ambulate with a walker, progressive dementia, only oriented to self at baseline. Patient does not have immediate family. She has an established DNR status.  Hospital Course:  1 sepsis secondary to UTI Patient presenting with sepsis noted to be hypotensive has a leukocytosis of 13.4, noted on admission to have a respiratory rate of 38. Patient also noted to have a elevated lactic acid level of 4.58. Marland Kitchen Cortisol level at 15. C. difficile PCR was negative.  Antibiotics change to keflex due to prolong QT. Day 6. Urine culture growing MMSA. Blood culture negative.   2 syncope Likely secondary to hypotension and possibly vasovagal as patient had syncopal episode while having a bowel movement. Patient noted to be hypotensive when EMS got there. Patient alert. Urinalysis consistent with UTI.   3 hypotension Likely secondary to volume depletion and UTI. C. difficile PCR was negative. Chest x-ray was negative. Patient has been pancultured cultures are pending. Cortisol level at 15. Lactic acid level trending down. First set of troponin was negative. Blood pressure improved with hydration and IV antibiotics. Saline lock IV fluids. Follow.  4 recurrent UTI  patient with recurrent UTIs. Urine cultures growing MSSA.  Continue with antibiotics. Day 6/7/  Tolerating keflex.   5 acute on chronic kidney disease  stage III Likely secondary to prerenal azotemia as patient was noted to be hypotensive on admission in the setting of ACE inhibitor and diuretics. ACE inhibitor on hold Follow on lasix.   6 hypothyroidism TSH 1.718. Continue home dose Synthroid.  7 iron deficiency anemia Continue home  regimen iron supplementation. Follow hb trend.   8 hypomagnesemia Repleted.  9 chronic atrial fibrillation with tachybradycardia syndrome status post PPM CHADS2VASC2 score is 4. HR improved. Continue with Cardizem.   10 supratherapeutic INR Coumadin Per pharmacy. INR at 1.5 discharge on 2 mg for next two days. 1 mg on Sunday. Need INR on monday  11 recently diagnosed type 2 diabetes mellitus Seems to be diet controlled as patient is not on any diabetic medications as outpatient. CBGs have ranged between 114-119. Continue sliding scale insulin.  12 prophylaxis Pepcid for GI prophylaxis.   13-Acute on chronic diastolic dysfunction;  Lasix resume 7-20 Breathing better. Received one dose of IV lasix 7-21  Patient has 24 hours care at home. She wants to go home. Decline PT   Procedures: none Consultations:  none  Discharge Exam: Filed Vitals:   04/04/15 0500  BP: 119/73  Pulse: 92  Temp: 98.6 F (37 C)  Resp: 18    General: Alert in no distress.  Cardiovascular: S 1, S 2 RRR Respiratory: CTA  Discharge Instructions   Discharge Instructions    Diet - low sodium heart healthy    Complete by:  As directed      Diet - low sodium heart healthy    Complete by:  As directed      Increase activity slowly    Complete by:  As directed      Increase activity slowly    Complete by:  As directed           Current Discharge Medication List    START taking these medications   Details  cephALEXin (KEFLEX) 250 MG capsule Take 1 capsule (250 mg total) by mouth every 12 (twelve) hours. Qty: 4 capsule, Refills: 0      CONTINUE these medications which have CHANGED   Details  warfarin (COUMADIN) 1 MG tablet Take 2 mg of coumadin on 7-22 and 7-23.  Take 1 mg of coumadin on 7-24.  Need INR on 7-25. Qty: 30 tablet, Refills: 0      CONTINUE these medications which have NOT CHANGED   Details  albuterol (PROAIR HFA) 108 (90 BASE) MCG/ACT inhaler Inhale 1 puff into the  lungs every hour as needed for wheezing or shortness of breath.    albuterol (PROVENTIL) (2.5 MG/3ML) 0.083% nebulizer solution Take 3 mLs (2.5 mg total) by nebulization every 2 (two) hours as needed for wheezing. Qty: 75 mL, Refills: 12    cholecalciferol (VITAMIN D) 1000 UNITS tablet Take 1,000 Units by mouth daily.    diltiazem (CARDIZEM CD) 180 MG 24 hr capsule Take 180 mg by mouth daily before breakfast. Take on an empty stomach    famotidine-calcium carbonate-magnesium hydroxide (PEPCID COMPLETE) 10-800-165 MG CHEW chewable tablet Chew 1 tablet by mouth 2 (two) times daily as needed (hearburn/ acid reflux).    ferrous sulfate 325 (65 FE) MG tablet Take 1 tablet (325 mg total) by mouth daily with breakfast. Qty: 30 tablet, Refills: 3    furosemide (LASIX) 40 MG tablet Take 40 mg by mouth daily.    levothyroxine (SYNTHROID, LEVOTHROID) 75 MCG tablet Take 37.5 mcg by mouth daily before breakfast. Take 1 hour after diltiazem  loratadine (CLARITIN) 10 MG tablet Take 10 mg by mouth daily.    Menthol-Methyl Salicylate (MUSCLE RUB) 10-15 % CREA Apply 1 application topically at bedtime.     Methylfol-Methylcob-Acetylcyst (METAFOLBIC PLUS PO) Take 1 tablet by mouth at bedtime.    Multiple Vitamins-Minerals (ICAPS AREDS FORMULA PO) Take 1 capsule by mouth at bedtime.     omeprazole (PRILOSEC) 20 MG capsule Take 20 mg by mouth daily.    Oxycodone HCl 10 MG TABS Take 10 mg by mouth 4 (four) times daily as needed (pain).     Polyethyl Glycol-Propyl Glycol (SYSTANE OP) Place 1 drop into both eyes 2 (two) times daily.    polyethylene glycol (MIRALAX / GLYCOLAX) packet Take 17 g by mouth daily as needed (constipation).    ranitidine (ZANTAC) 150 MG tablet Take 150 mg by mouth 2 (two) times daily.    sertraline (ZOLOFT) 25 MG tablet Take 12.5 mg by mouth daily.    trandolapril (MAVIK) 1 MG tablet Take 1 mg by mouth daily.    traZODone (DESYREL) 50 MG tablet Take 25 mg by mouth at  bedtime.    potassium chloride SA (K-DUR,KLOR-CON) 20 MEQ tablet Take 1 tablet (20 mEq total) by mouth 2 (two) times daily. Qty: 60 tablet, Refills: 2       Allergies  Allergen Reactions  . Ceftin [Cefuroxime Axetil] Shortness Of Breath and Itching  . Amiodarone Other (See Comments)    dizziness  . Micardis [Telmisartan] Other (See Comments)    Fatigue and back pain  . Plavix [Clopidogrel Bisulfate] Other (See Comments)    dizziness  . Benzyl Alcohol Rash    Derivatives also   . Caffeine Rash  . Chlordiazepoxide-Clidinium Rash  . Chocolate Rash  . Codeine Phosphate Rash  . Conjugated Estrogens Rash  . Darvon Rash  . Dilaudid [Hydromorphone Hcl] Rash  . Dimetapp Cold-Allergy Rash  . Dyazide [Hydrochlorothiazide W-Triamterene] Rash  . Erythromycin Rash  . Estrogens Rash  . Fungizone [Amphotericin B] Rash    Unsure if fungi allergy or amphotericin B allergy?  . Guaifenesin & Derivatives Rash  . Ibuprofen Rash  . Influenza Vaccines Swelling  . Iodine Rash  . Milk [Dairy] Diarrhea  . Nitroglycerin Other (See Comments)    Head ache   . Other Other (See Comments)    dog and cat dander, mold, mildew, fungi, dust, causes sneezing and runny nose   . Penicillins Rash    Also penicillin G    . Petroleum Jelly [Petrolatum] Swelling    Lipsticks also (lip swelling)  . Pineapple Rash  . Prednisone Rash  . Strawberry Rash  . Sulfamethoxazole Rash  . Tagamet [Cimetidine] Rash       . Talwin [Pentazocine] Rash  . Tape Rash    Paper tape irritates her skin  . Zithromax [Azithromycin Dihydrate] Rash   Follow-up Information    Follow up with FRIED, ROBERT L, MD In 1 week.   Specialty:  Family Medicine   Why:  please keep appointment to check coumadin level.    Contact information:   [id number] [place]  [id number] [phone]        The results of significant diagnostics from this hospitalization (including imaging, microbiology, ancillary and  laboratory) are listed below for reference.    Significant Diagnostic Studies: Dg Chest Portable 1 View  03/30/2015   CLINICAL DATA:  Syncopal episode with hypotension  EXAM: PORTABLE CHEST - 1 VIEW  COMPARISON:  12/18/2014  FINDINGS: Cardiac  shadow is again enlarged. A pacing device is again seen and stable. Elevation the right hemidiaphragm is again noted. No focal confluent infiltrate is seen. With improved aeration from the prior exam is noted.  IMPRESSION: Chronic changes without acute abnormality.   Electronically Signed   By: Inez Catalina M.D.   On: 03/30/2015 15:13    Microbiology: Recent Results (from the past 240 hour(s))  Culture, blood (routine x 2)     Status: None (Preliminary result)   Collection Time: 03/30/15  2:50 PM  Result Value Ref Range Status   Specimen Description BLOOD LEFT ARM  Final   Special Requests BOTTLES DRAWN AEROBIC AND ANAEROBIC 5CC  Final   Culture NO GROWTH 4 DAYS  Final   Report Status PENDING  Incomplete  Culture, blood (routine x 2)     Status: None (Preliminary result)   Collection Time: 03/30/15  3:09 PM  Result Value Ref Range Status   Specimen Description BLOOD LEFT FOREARM  Final   Special Requests BOTTLES DRAWN AEROBIC ONLY 4CC  Final   Culture NO GROWTH 4 DAYS  Final   Report Status PENDING  Incomplete  Urine culture     Status: None   Collection Time: 03/30/15  3:22 PM  Result Value Ref Range Status   Specimen Description URINE, RANDOM  Final   Special Requests NONE  Final   Culture >=100,000 COLONIES/mL STAPHYLOCOCCUS AUREUS  Final   Report Status 04/01/2015 FINAL  Final   Organism ID, Bacteria STAPHYLOCOCCUS AUREUS  Final      Susceptibility   Staphylococcus aureus - MIC*    CIPROFLOXACIN <=0.5 SENSITIVE Sensitive     GENTAMICIN <=0.5 SENSITIVE Sensitive     NITROFURANTOIN <=16 SENSITIVE Sensitive     OXACILLIN 0.5 SENSITIVE Sensitive     TETRACYCLINE >=16 RESISTANT Resistant     VANCOMYCIN <=0.5 SENSITIVE Sensitive      TRIMETH/SULFA <=10 SENSITIVE Sensitive     CLINDAMYCIN <=0.25 SENSITIVE Sensitive     RIFAMPIN <=0.5 SENSITIVE Sensitive     Inducible Clindamycin NEGATIVE Sensitive     * >=100,000 COLONIES/mL STAPHYLOCOCCUS AUREUS  Clostridium Difficile by PCR (not at [facility])     Status: None   Collection Time: 03/30/15  5:22 PM  Result Value Ref Range Status   C difficile by pcr NEGATIVE NEGATIVE Final  MRSA PCR Screening     Status: None   Collection Time: 03/30/15  5:52 PM  Result Value Ref Range Status   MRSA by PCR NEGATIVE NEGATIVE Final    Comment:        The GeneXpert MRSA Assay (FDA approved for NASAL specimens only), is one component of a comprehensive MRSA colonization surveillance program. It is not intended to diagnose MRSA infection nor to guide or monitor treatment for MRSA infections.      Labs: Basic Metabolic Panel:  Recent Labs Lab 03/30/15 1845 03/31/15 0311 03/31/15 0973 04/01/15 0248 04/02/15 0243 04/03/15 0453 04/04/15 0538  NA  --  141  --  138 140 140 140  K  --  3.9  --  3.7 3.4* 3.8 3.5  CL  --  110  --  106 103 103 99*  CO2  --  26  --  26 31 31 31   GLUCOSE  --  126*  --  114* 113* 107* 102*  BUN  --  23*  --  20 14 14 14   CREATININE  --  1.60*  --  1.41* 1.12* 1.05* 1.13*  CALCIUM  --  7.7*  --  8.1* 8.3* 8.6* 8.3*  MG 1.6*  --  1.8 2.2  --  1.7  --    Liver Function Tests:  Recent Labs Lab 03/30/15 1450 03/31/15 0311 04/01/15 0248  AST 29 31 27   ALT 13* 17 17  ALKPHOS 95 68 75  BILITOT 0.5 0.3 0.8  PROT 6.2* 4.7* 5.4*  ALBUMIN 3.1* 2.3* 2.7*    Recent Labs Lab 03/30/15 1450  LIPASE 27   No results for input(s): AMMONIA in the last 168 hours. CBC:  Recent Labs Lab 03/30/15 1450 03/31/15 0311 04/01/15 0248 04/02/15 0243  WBC 6.3 13.4* 8.0 6.2  NEUTROABS 4.1  --  6.3  --   HGB 14.0 10.1* 10.0* 9.9*  HCT 44.1 31.1* 31.6* 30.3*  MCV 91.9 91.2 91.3 91.5  PLT 221 170 166 155   Cardiac Enzymes:  Recent Labs Lab 03/30/15 1450   TROPONINI <0.03   BNP: BNP (last 3 results)  Recent Labs  12/17/14 0656 12/18/14 1250  BNP 328.8* 691.4*    ProBNP (last 3 results) No results for input(s): PROBNP in the last 8760 hours.  CBG:  Recent Labs Lab 04/03/15 0736 04/03/15 1131 04/03/15 1659 04/03/15 2103 04/04/15 0755  GLUCAP 114* 120* 143* 114* 132*       Signed:  Hommer Cunliffe A  [facility] 04/04/2015, 10:39 AM

## 2015-04-04 NOTE — Progress Notes (Signed)
ANTICOAGULATION CONSULT NOTE - Follow Up Consult  Pharmacy Consult for Coumadin Indication: atrial fibrillation  Allergies  Allergen Reactions  . Ceftin [Cefuroxime Axetil] Shortness Of Breath and Itching  . Amiodarone Other (See Comments)    dizziness  . Micardis [Telmisartan] Other (See Comments)    Fatigue and back pain  . Plavix [Clopidogrel Bisulfate] Other (See Comments)    dizziness  . Benzyl Alcohol Rash    Derivatives also   . Caffeine Rash  . Chlordiazepoxide-Clidinium Rash  . Chocolate Rash  . Codeine Phosphate Rash  . Conjugated Estrogens Rash  . Darvon Rash  . Dilaudid [Hydromorphone Hcl] Rash  . Dimetapp Cold-Allergy Rash  . Dyazide [Hydrochlorothiazide W-Triamterene] Rash  . Erythromycin Rash  . Estrogens Rash  . Fungizone [Amphotericin B] Rash    Unsure if fungi allergy or amphotericin B allergy?  . Guaifenesin & Derivatives Rash  . Ibuprofen Rash  . Influenza Vaccines Swelling  . Iodine Rash  . Milk [Dairy] Diarrhea  . Nitroglycerin Other (See Comments)    Head ache   . Other Other (See Comments)    dog and cat dander, mold, mildew, fungi, dust, causes sneezing and runny nose   . Penicillins Rash    Also penicillin G    . Petroleum Jelly [Petrolatum] Swelling    Lipsticks also (lip swelling)  . Pineapple Rash  . Prednisone Rash  . Strawberry Rash  . Sulfamethoxazole Rash  . Tagamet [Cimetidine] Rash       . Talwin [Pentazocine] Rash  . Tape Rash    Paper tape irritates her skin  . Zithromax [Azithromycin Dihydrate] Rash    Patient Measurements: Height: 5\' 2"  (157.5 cm) Weight: 173 lb 15.1 oz (78.9 kg) IBW/kg (Calculated) : 50.1 Heparin Dosing Weight:   Vital Signs: Temp: 98.6 F (37 C) (07/22 0500) Temp Source: Oral (07/22 0500) BP: 119/73 mmHg (07/22 0500) Pulse Rate: 92 (07/22 0500)  Labs:  Recent Labs  04/02/15 0243 04/03/15 0453 04/04/15 0538  HGB 9.9*  --   --   HCT 30.3*  --   --   PLT 155  --   --   LABPROT 27.8*  19.0* 16.9*  INR 2.64* 1.59* 1.36  CREATININE 1.12* 1.05* 1.13*    Estimated Creatinine Clearance: 30.9 mL/min (by C-G formula based on Cr of 1.13).   Medications:  Scheduled:  . antiseptic oral rinse  7 mL Mouth Rinse BID  . cephALEXin  250 mg Oral Q12H  . diltiazem  180 mg Oral QAC breakfast  . famotidine  20 mg Oral Daily  . ferrous sulfate  325 mg Oral Q breakfast  . furosemide  40 mg Oral Daily  . insulin aspart  0-9 Units Subcutaneous TID WC  . levothyroxine  37.5 mcg Oral QAC breakfast  . loratadine  10 mg Oral Daily  . sertraline  12.5 mg Oral Daily  . traZODone  50 mg Oral QHS  . Warfarin - Pharmacist Dosing Inpatient   Does not apply q1800    Assessment: 81yo female admitted with supratherapeutic INR 3.76, dose held x 3 days.  INR down to 1.36 this AM, after 2nd dose since resuming.  Expect this is reflective of held doses.  Pt was on Coumadin 3mg  five days per week only, this averages to ~2mg  daily.  No bleeding noted.  Plan is for d/c home today  Goal of Therapy:  INR 2-3 Monitor platelets by anticoagulation protocol: Yes   Plan:  Coumadin 2mg  today Continue daily INR  while inpt Watch for s/s of bleeding  Recommendations: Coumadin 2mg  7/23 and 1mg  on 7/24 Repeat INR on 7/25  Gracy Bruins, PharmD Clinical Pharmacist [facility]

## 2015-05-13 ENCOUNTER — Ambulatory Visit (INDEPENDENT_AMBULATORY_CARE_PROVIDER_SITE_OTHER): Payer: Medicare Other | Admitting: *Deleted

## 2015-05-13 ENCOUNTER — Telehealth: Payer: Self-pay | Admitting: Cardiology

## 2015-05-13 DIAGNOSIS — I495 Sick sinus syndrome: Secondary | ICD-10-CM

## 2015-05-13 NOTE — Telephone Encounter (Signed)
Confirmed remote transmission w/ pt caregiver.   

## 2015-05-14 ENCOUNTER — Encounter: Payer: Self-pay | Admitting: Cardiology

## 2015-05-14 ENCOUNTER — Telehealth: Payer: Self-pay | Admitting: Internal Medicine

## 2015-05-14 NOTE — Telephone Encounter (Signed)
New message      Unable to do a remote transmission.  Please call

## 2015-05-14 NOTE — Telephone Encounter (Signed)
Spoke w/ pt caregiver and she is going to send pt transmission using her land line phone. Pt wirex does not get cellular signal and she does not have Internet.

## 2015-05-14 NOTE — Telephone Encounter (Signed)
LMOM requesting call back

## 2015-05-15 ENCOUNTER — Encounter: Payer: Self-pay | Admitting: Internal Medicine

## 2015-05-15 DIAGNOSIS — I495 Sick sinus syndrome: Secondary | ICD-10-CM

## 2015-05-15 NOTE — Progress Notes (Signed)
Remote pacemaker transmission.   

## 2015-05-23 LAB — CUP PACEART REMOTE DEVICE CHECK
Date Time Interrogation Session: 20160909100436
Lead Channel Impedance Value: 598 Ohm
Lead Channel Pacing Threshold Amplitude: 0.625 V
Lead Channel Pacing Threshold Pulse Width: 0.4 ms
Lead Channel Sensing Intrinsic Amplitude: 16 mV
Lead Channel Setting Pacing Amplitude: 2 V
Lead Channel Setting Pacing Pulse Width: 0.4 ms
Lead Channel Setting Sensing Sensitivity: 5.6 mV
MDC IDC STAT BRADY RV PERCENT PACED: 0.9 %

## 2015-05-27 ENCOUNTER — Telehealth: Payer: Self-pay | Admitting: Internal Medicine

## 2015-05-27 NOTE — Telephone Encounter (Signed)
NEW MESSAGE   Are you calling to see if we received your device transmission? YES  Pt received a letter indicating the transmission did not go through. Request a call back to discuss

## 2015-05-27 NOTE — Telephone Encounter (Signed)
LMOVM informing caregiver that pt transmission was received on 05-15-15

## 2015-06-18 ENCOUNTER — Encounter: Payer: Self-pay | Admitting: Cardiology

## 2015-08-05 ENCOUNTER — Ambulatory Visit (INDEPENDENT_AMBULATORY_CARE_PROVIDER_SITE_OTHER): Payer: Medicare Other | Admitting: Podiatry

## 2015-08-05 ENCOUNTER — Encounter: Payer: Self-pay | Admitting: Podiatry

## 2015-08-05 DIAGNOSIS — B351 Tinea unguium: Secondary | ICD-10-CM

## 2015-08-05 DIAGNOSIS — M79673 Pain in unspecified foot: Secondary | ICD-10-CM

## 2015-08-05 DIAGNOSIS — M79609 Pain in unspecified limb: Principal | ICD-10-CM

## 2015-08-05 NOTE — Progress Notes (Signed)
Patient ID: [name], female   DOB: Jul 09, 1920, 81 y.o.   MRN: [id number]  Subjective: This patient presents complaining of painful toenails with direct shoe pressure and request toenail debridement  Objective: Patient appears orientated 3 Patient's caregiver present treatment room Patient transfers from wheelchair to treatment chair The toenails are extremely elongated with, brittle, incurvated, hypertrophic, and deformed and tender direct palpation 6-10 No open skin lesions bilaterally Bilateral HAV Hammertoe second left  Assessment: Symptomatic onychomycoses 6-10 Type II diabetic  Plan: Debridement toenails 10 and mechanically and electrically without any bleeding  Reappoint at three-month intervals or at patient's request

## 2015-08-05 NOTE — Patient Instructions (Signed)
Diabetes and Foot Care Diabetes may cause you to have problems because of poor blood supply (circulation) to your feet and legs. This may cause the skin on your feet to become thinner, break easier, and heal more slowly. Your skin may become dry, and the skin may peel and crack. You may also have nerve damage in your legs and feet causing decreased feeling in them. You may not notice minor injuries to your feet that could lead to infections or more serious problems. Taking care of your feet is one of the most important things you can do for yourself.  HOME CARE INSTRUCTIONS  Wear shoes at all times, even in the house. Do not go barefoot. Bare feet are easily injured.  Check your feet daily for blisters, cuts, and redness. If you cannot see the bottom of your feet, use a mirror or ask someone for help.  Wash your feet with warm water (do not use hot water) and mild soap. Then pat your feet and the areas between your toes until they are completely dry. Do not soak your feet as this can dry your skin.  Apply a moisturizing lotion or petroleum jelly (that does not contain alcohol and is unscented) to the skin on your feet and to dry, brittle toenails. Do not apply lotion between your toes.  Trim your toenails straight across. Do not dig under them or around the cuticle. File the edges of your nails with an emery board or nail file.  Do not cut corns or calluses or try to remove them with medicine.  Wear clean socks or stockings every day. Make sure they are not too tight. Do not wear knee-high stockings since they may decrease blood flow to your legs.  Wear shoes that fit properly and have enough cushioning. To break in new shoes, wear them for just a few hours a day. This prevents you from injuring your feet. Always look in your shoes before you put them on to be sure there are no objects inside.  Do not cross your legs. This may decrease the blood flow to your feet.  If you find a minor scrape,  cut, or break in the skin on your feet, keep it and the skin around it clean and dry. These areas may be cleansed with mild soap and water. Do not cleanse the area with peroxide, alcohol, or iodine.  When you remove an adhesive bandage, be sure not to damage the skin around it.  If you have a wound, look at it several times a day to make sure it is healing.  Do not use heating pads or hot water bottles. They may burn your skin. If you have lost feeling in your feet or legs, you may not know it is happening until it is too late.  Make sure your health care provider performs a complete foot exam at least annually or more often if you have foot problems. Report any cuts, sores, or bruises to your health care provider immediately. SEEK MEDICAL CARE IF:   You have an injury that is not healing.  You have cuts or breaks in the skin.  You have an ingrown nail.  You notice redness on your legs or feet.  You feel burning or tingling in your legs or feet.  You have pain or cramps in your legs and feet.  Your legs or feet are numb.  Your feet always feel cold. SEEK IMMEDIATE MEDICAL CARE IF:   There is increasing redness,   swelling, or pain in or around a wound.  There is a red line that goes up your leg.  Pus is coming from a wound.  You develop a fever or as directed by your health care provider.  You notice a bad smell coming from an ulcer or wound.   This information is not intended to replace advice given to you by your health care provider. Make sure you discuss any questions you have with your health care provider.   Document Released: 08/27/2000 Document Revised: 05/02/2013 Document Reviewed: 02/06/2013 Elsevier Interactive Patient Education 2016 [employer]  

## 2015-08-10 IMAGING — CT CT HEAD W/O CM
1 series · 15 of 30 positions shown, 19 images · non-contrast
Comparison: October 09, 2006

CLINICAL DATA: Progressive pain; Herpes zoster over left face

EXAM:
CT HEAD WITHOUT CONTRAST
TECHNIQUE: Contiguous axial images were obtained from the base of the skull
through the vertex without intravenous contrast.

[Series 2: head 5.0 h30s · axial · 0.47mm/px · z∈[+1152,+1307]mm · 15 of 35 slices shown, 19 images]
[im 2/35  brain]
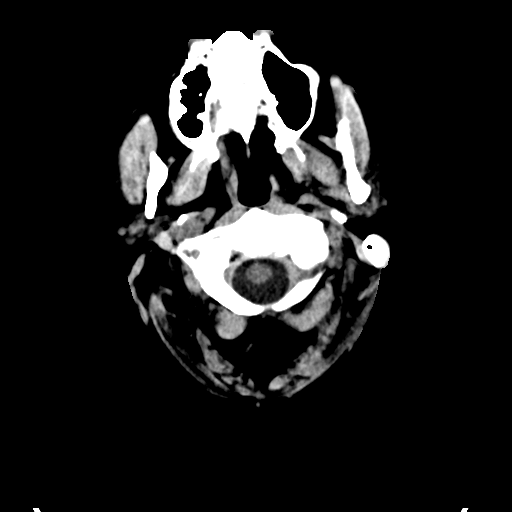
[im 2/35  bone]
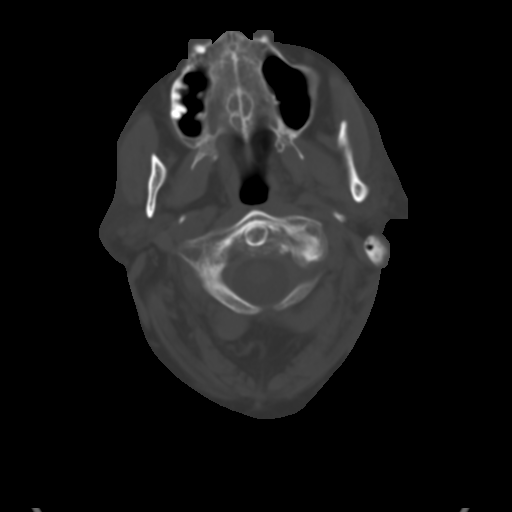
[im 4/35  brain]
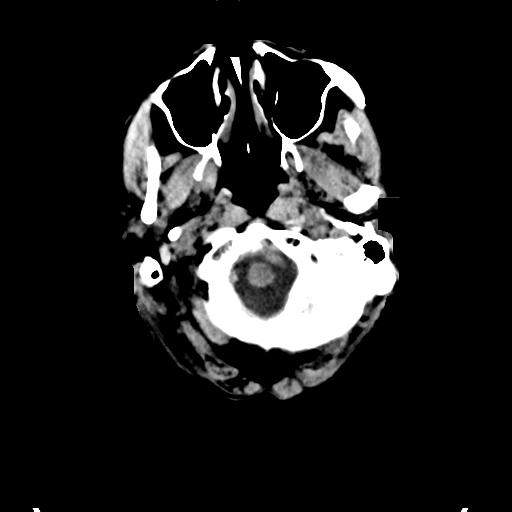
[im 6/35  brain]
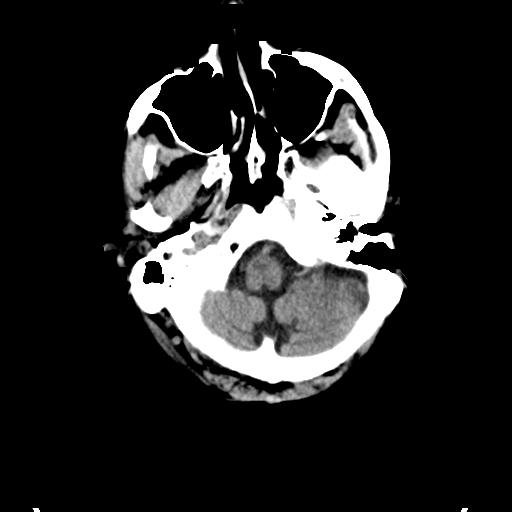
[im 9/35  brain]
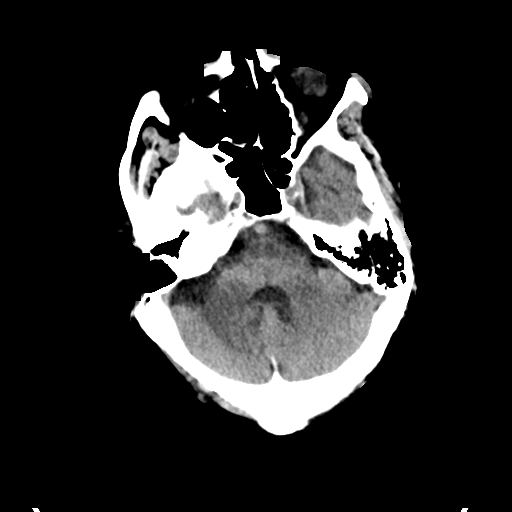
[im 11/35  brain]
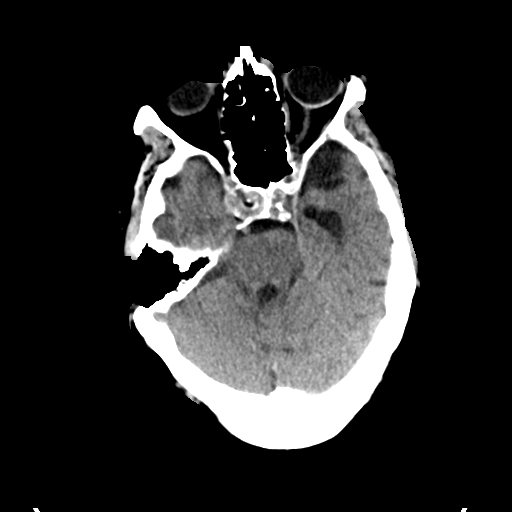
[im 11/35  bone]
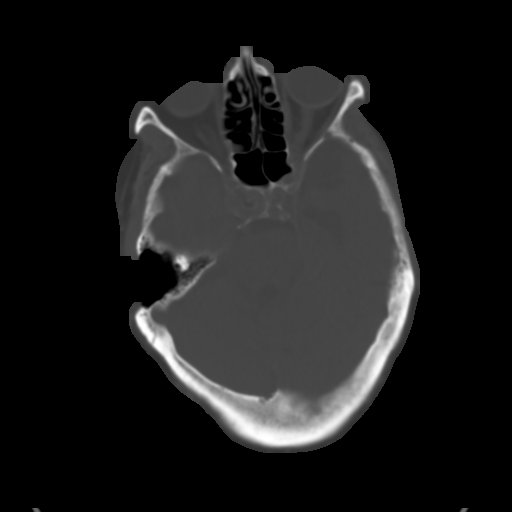
[im 13/35  brain]
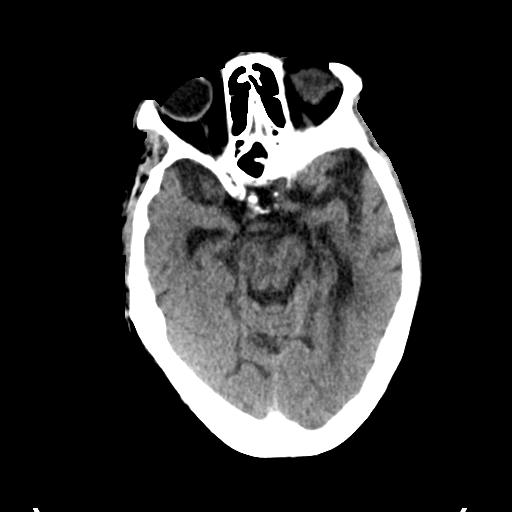
[im 16/35  brain]
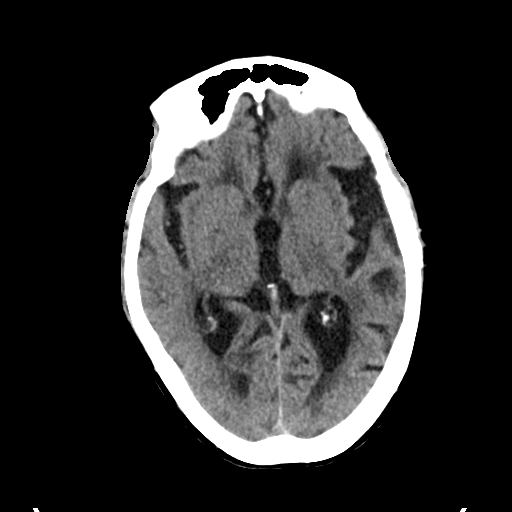
[im 18/35  brain]
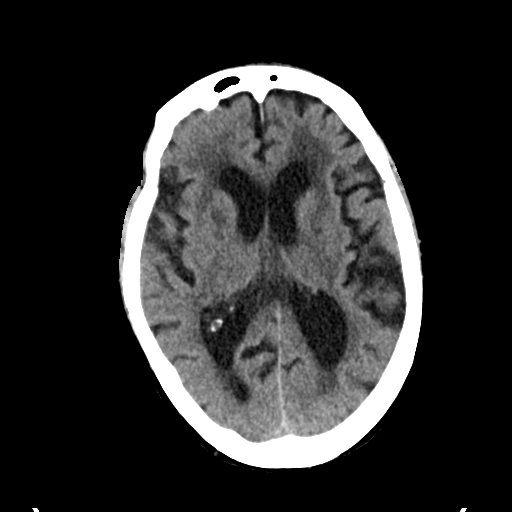
[im 19/35  brain]
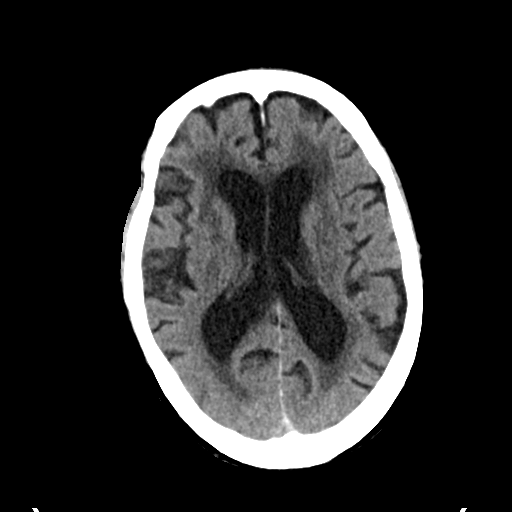
[im 19/35  bone]
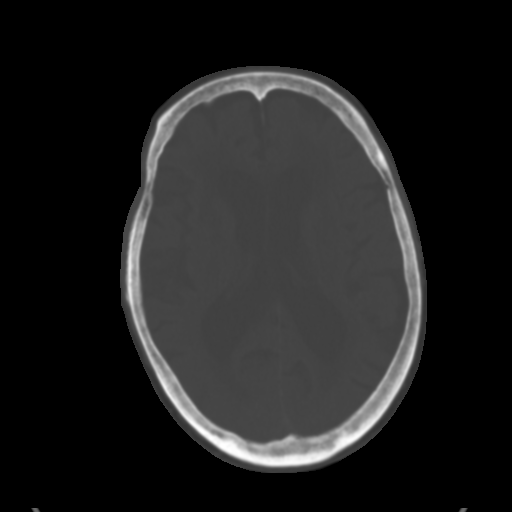
[im 22/35  brain]
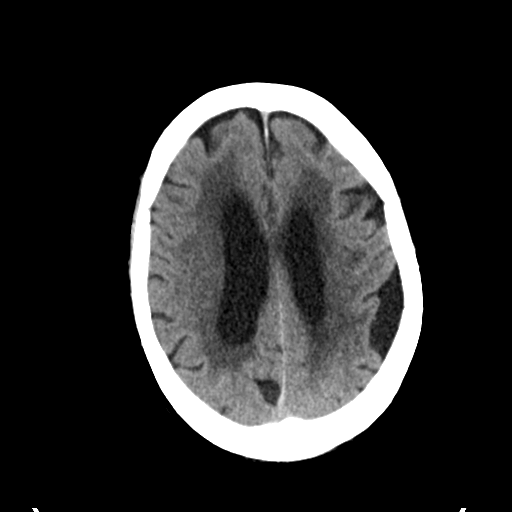
[im 24/35  brain]
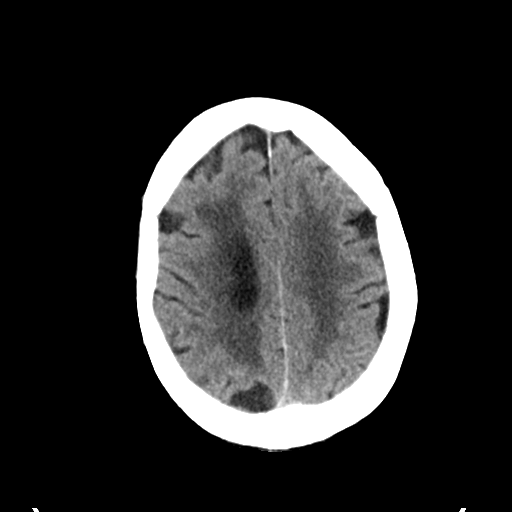
[im 26/35  brain]
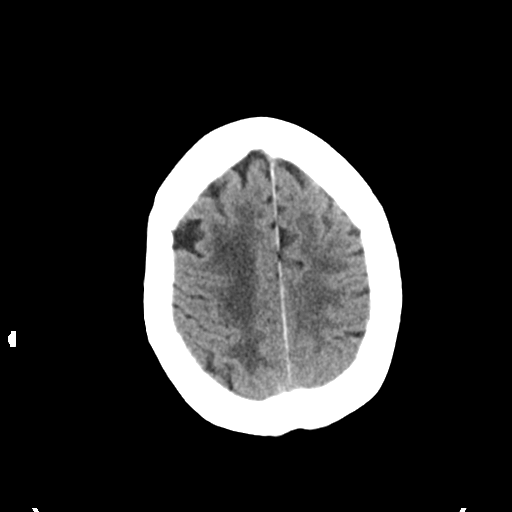
[im 29/35  brain]
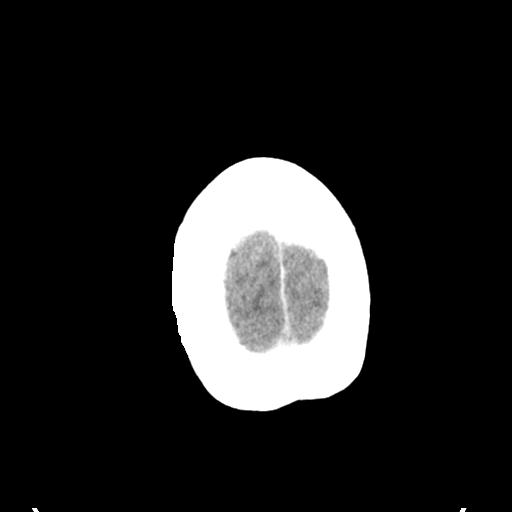
[im 29/35  bone]
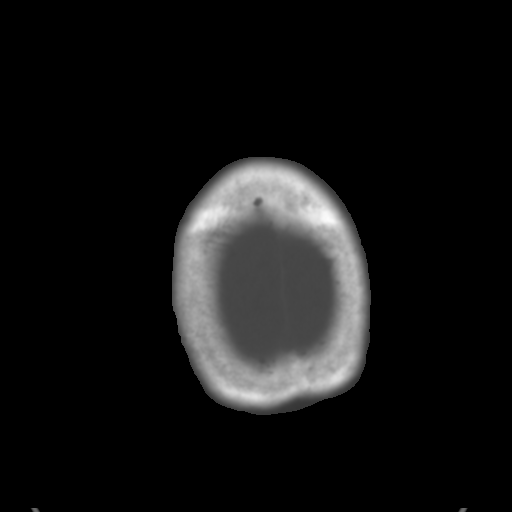
[im 31/35  brain]
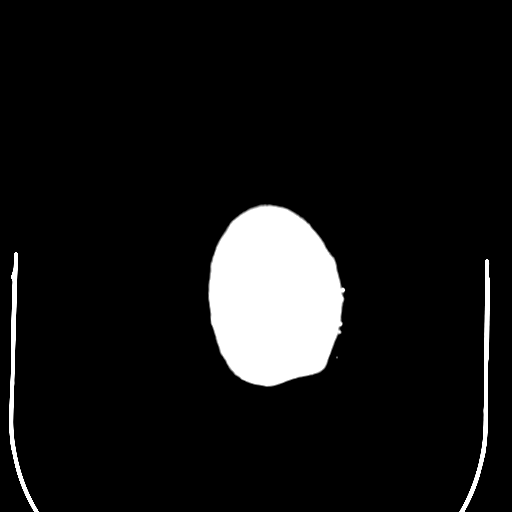
[im 33/35  brain]
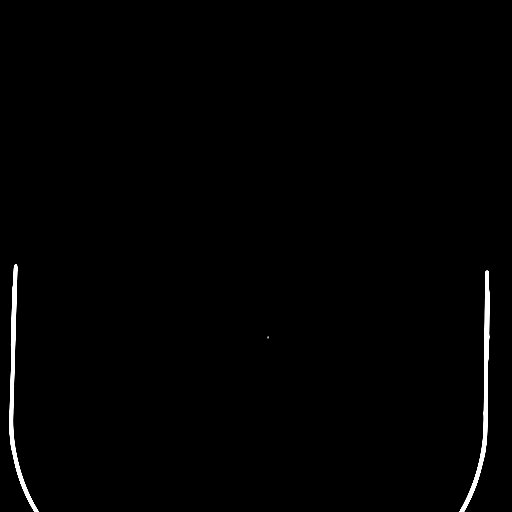

[15 of 30 positions shown; findings below may reference images not displayed]

FINDINGS: Moderate diffuse atrophy is stable. There is no mass, hemorrhage,
extra-axial fluid collection, or midline shift. There is small
vessel disease throughout the centra semiovale bilaterally as well
as lacunar type infarcts in both lentiform nuclei and internal
capsules, stable. There is small vessel disease in the thalami as
well. There is no acute appearing infarct.

. Patient appears to have had partial mastoidectomies bilaterally.
Aerated mastoids appear clear bilaterally. There is mild posterior
right ethmoid sinus disease. Incidental note is made of a torus
palatinus.
IMPRESSION: Atrophy with supratentorial small vessel disease, stable. No
intracranial mass, hemorrhage, or acute appearing infarct. Mild
right ethmoid sinus disease. Evidence of prior mastoid surgery
without focal change in the appearance of mastoids compared to prior
study. Torus palatinus noted incidentally.

## 2015-08-18 ENCOUNTER — Ambulatory Visit (INDEPENDENT_AMBULATORY_CARE_PROVIDER_SITE_OTHER): Payer: Medicare Other | Admitting: *Deleted

## 2015-08-18 DIAGNOSIS — I495 Sick sinus syndrome: Secondary | ICD-10-CM | POA: Diagnosis not present

## 2015-08-18 NOTE — Progress Notes (Signed)
Remote pacemaker transmission.   

## 2015-08-27 LAB — CUP PACEART REMOTE DEVICE CHECK
Battery Impedance: 472 Ohm
Date Time Interrogation Session: 20161205151233
Implantable Lead Implant Date: 20040923
Lead Channel Impedance Value: 0 Ohm
Lead Channel Impedance Value: 633 Ohm
Lead Channel Pacing Threshold Amplitude: 0.625 V
Lead Channel Pacing Threshold Pulse Width: 0.4 ms
Lead Channel Sensing Intrinsic Amplitude: 16 mV
Lead Channel Setting Pacing Pulse Width: 0.4 ms
MDC IDC LEAD LOCATION: 753860
MDC IDC MSMT BATTERY REMAINING LONGEVITY: 98 mo
MDC IDC MSMT BATTERY VOLTAGE: 2.77 V
MDC IDC SET LEADCHNL RV PACING AMPLITUDE: 2 V
MDC IDC SET LEADCHNL RV SENSING SENSITIVITY: 5.6 mV
MDC IDC STAT BRADY RV PERCENT PACED: 1 %

## 2015-08-29 ENCOUNTER — Encounter: Payer: Self-pay | Admitting: Cardiology

## 2015-10-19 ENCOUNTER — Inpatient Hospital Stay (HOSPITAL_COMMUNITY): Payer: Medicare Other

## 2015-10-19 ENCOUNTER — Inpatient Hospital Stay (HOSPITAL_COMMUNITY)
Admission: EM | Admit: 2015-10-19 | Discharge: 2015-10-22 | DRG: 481 | Disposition: A | Discharge status: Skilled Nursing Facility | Payer: Medicare Other | Attending: Family Medicine | Admitting: Family Medicine

## 2015-10-19 ENCOUNTER — Emergency Department (HOSPITAL_COMMUNITY): Payer: Medicare Other

## 2015-10-19 ENCOUNTER — Encounter (HOSPITAL_COMMUNITY): Payer: Self-pay | Admitting: *Deleted

## 2015-10-19 DIAGNOSIS — Z885 Allergy status to narcotic agent status: Secondary | ICD-10-CM

## 2015-10-19 DIAGNOSIS — Z79899 Other long term (current) drug therapy: Secondary | ICD-10-CM

## 2015-10-19 DIAGNOSIS — E039 Hypothyroidism, unspecified: Secondary | ICD-10-CM | POA: Diagnosis present

## 2015-10-19 DIAGNOSIS — Z8673 Personal history of transient ischemic attack (TIA), and cerebral infarction without residual deficits: Secondary | ICD-10-CM | POA: Diagnosis not present

## 2015-10-19 DIAGNOSIS — Z91018 Allergy to other foods: Secondary | ICD-10-CM | POA: Diagnosis not present

## 2015-10-19 DIAGNOSIS — N179 Acute kidney failure, unspecified: Secondary | ICD-10-CM | POA: Diagnosis present

## 2015-10-19 DIAGNOSIS — E1165 Type 2 diabetes mellitus with hyperglycemia: Secondary | ICD-10-CM | POA: Diagnosis present

## 2015-10-19 DIAGNOSIS — I5030 Unspecified diastolic (congestive) heart failure: Secondary | ICD-10-CM | POA: Diagnosis present

## 2015-10-19 DIAGNOSIS — Z66 Do not resuscitate: Secondary | ICD-10-CM | POA: Diagnosis present

## 2015-10-19 DIAGNOSIS — Z886 Allergy status to analgesic agent status: Secondary | ICD-10-CM

## 2015-10-19 DIAGNOSIS — F0280 Dementia in other diseases classified elsewhere without behavioral disturbance: Secondary | ICD-10-CM | POA: Diagnosis present

## 2015-10-19 DIAGNOSIS — G8929 Other chronic pain: Secondary | ICD-10-CM | POA: Diagnosis present

## 2015-10-19 DIAGNOSIS — M4806 Spinal stenosis, lumbar region: Secondary | ICD-10-CM | POA: Diagnosis present

## 2015-10-19 DIAGNOSIS — Z853 Personal history of malignant neoplasm of breast: Secondary | ICD-10-CM | POA: Diagnosis not present

## 2015-10-19 DIAGNOSIS — I130 Hypertensive heart and chronic kidney disease with heart failure and stage 1 through stage 4 chronic kidney disease, or unspecified chronic kidney disease: Secondary | ICD-10-CM | POA: Diagnosis present

## 2015-10-19 DIAGNOSIS — Z888 Allergy status to other drugs, medicaments and biological substances status: Secondary | ICD-10-CM

## 2015-10-19 DIAGNOSIS — Z887 Allergy status to serum and vaccine status: Secondary | ICD-10-CM

## 2015-10-19 DIAGNOSIS — I739 Peripheral vascular disease, unspecified: Secondary | ICD-10-CM | POA: Diagnosis present

## 2015-10-19 DIAGNOSIS — K219 Gastro-esophageal reflux disease without esophagitis: Secondary | ICD-10-CM | POA: Diagnosis present

## 2015-10-19 DIAGNOSIS — K5900 Constipation, unspecified: Secondary | ICD-10-CM | POA: Diagnosis present

## 2015-10-19 DIAGNOSIS — S72141A Displaced intertrochanteric fracture of right femur, initial encounter for closed fracture: Principal | ICD-10-CM | POA: Diagnosis present

## 2015-10-19 DIAGNOSIS — M545 Low back pain: Secondary | ICD-10-CM | POA: Diagnosis present

## 2015-10-19 DIAGNOSIS — S72009A Fracture of unspecified part of neck of unspecified femur, initial encounter for closed fracture: Secondary | ICD-10-CM | POA: Diagnosis present

## 2015-10-19 DIAGNOSIS — I482 Chronic atrial fibrillation: Secondary | ICD-10-CM | POA: Diagnosis present

## 2015-10-19 DIAGNOSIS — I5032 Chronic diastolic (congestive) heart failure: Secondary | ICD-10-CM | POA: Diagnosis not present

## 2015-10-19 DIAGNOSIS — Z881 Allergy status to other antibiotic agents status: Secondary | ICD-10-CM | POA: Diagnosis not present

## 2015-10-19 DIAGNOSIS — Z950 Presence of cardiac pacemaker: Secondary | ICD-10-CM | POA: Diagnosis not present

## 2015-10-19 DIAGNOSIS — D509 Iron deficiency anemia, unspecified: Secondary | ICD-10-CM | POA: Diagnosis present

## 2015-10-19 DIAGNOSIS — I4891 Unspecified atrial fibrillation: Secondary | ICD-10-CM | POA: Diagnosis present

## 2015-10-19 DIAGNOSIS — M810 Age-related osteoporosis without current pathological fracture: Secondary | ICD-10-CM | POA: Diagnosis present

## 2015-10-19 DIAGNOSIS — K449 Diaphragmatic hernia without obstruction or gangrene: Secondary | ICD-10-CM | POA: Diagnosis present

## 2015-10-19 DIAGNOSIS — I10 Essential (primary) hypertension: Secondary | ICD-10-CM | POA: Diagnosis not present

## 2015-10-19 DIAGNOSIS — N183 Chronic kidney disease, stage 3 (moderate): Secondary | ICD-10-CM | POA: Diagnosis present

## 2015-10-19 DIAGNOSIS — G309 Alzheimer's disease, unspecified: Secondary | ICD-10-CM | POA: Diagnosis present

## 2015-10-19 DIAGNOSIS — I872 Venous insufficiency (chronic) (peripheral): Secondary | ICD-10-CM | POA: Diagnosis present

## 2015-10-19 DIAGNOSIS — W19XXXA Unspecified fall, initial encounter: Secondary | ICD-10-CM | POA: Diagnosis present

## 2015-10-19 DIAGNOSIS — E1122 Type 2 diabetes mellitus with diabetic chronic kidney disease: Secondary | ICD-10-CM | POA: Diagnosis present

## 2015-10-19 DIAGNOSIS — N189 Chronic kidney disease, unspecified: Secondary | ICD-10-CM

## 2015-10-19 DIAGNOSIS — I495 Sick sinus syndrome: Secondary | ICD-10-CM | POA: Diagnosis present

## 2015-10-19 DIAGNOSIS — T148XXA Other injury of unspecified body region, initial encounter: Secondary | ICD-10-CM

## 2015-10-19 DIAGNOSIS — D62 Acute posthemorrhagic anemia: Secondary | ICD-10-CM | POA: Diagnosis not present

## 2015-10-19 DIAGNOSIS — W1830XA Fall on same level, unspecified, initial encounter: Secondary | ICD-10-CM | POA: Diagnosis present

## 2015-10-19 DIAGNOSIS — Z7901 Long term (current) use of anticoagulants: Secondary | ICD-10-CM

## 2015-10-19 DIAGNOSIS — Z91048 Other nonmedicinal substance allergy status: Secondary | ICD-10-CM

## 2015-10-19 DIAGNOSIS — W19XXXD Unspecified fall, subsequent encounter: Secondary | ICD-10-CM | POA: Diagnosis not present

## 2015-10-19 DIAGNOSIS — Z86718 Personal history of other venous thrombosis and embolism: Secondary | ICD-10-CM | POA: Diagnosis not present

## 2015-10-19 DIAGNOSIS — Z880 Allergy status to penicillin: Secondary | ICD-10-CM | POA: Diagnosis not present

## 2015-10-19 LAB — BASIC METABOLIC PANEL
ANION GAP: 12 (ref 5–15)
BUN: 28 mg/dL — AB (ref 6–20)
CALCIUM: 8.8 mg/dL — AB (ref 8.9–10.3)
CO2: 28 mmol/L (ref 22–32)
CREATININE: 1.43 mg/dL — AB (ref 0.44–1.00)
Chloride: 101 mmol/L (ref 101–111)
GFR calc Af Amer: 35 mL/min — ABNORMAL LOW (ref >60)
GFR, EST NON AFRICAN AMERICAN: 31 mL/min — AB (ref >60)
GLUCOSE: 154 mg/dL — AB (ref 65–99)
Potassium: 4.1 mmol/L (ref 3.5–5.1)
Sodium: 141 mmol/L (ref 135–145)

## 2015-10-19 LAB — URINE MICROSCOPIC-ADD ON

## 2015-10-19 LAB — CBC
HCT: 39.7 % (ref 36.0–46.0)
Hemoglobin: 12.8 g/dL (ref 12.0–15.0)
MCH: 31.4 pg (ref 26.0–34.0)
MCHC: 32.2 g/dL (ref 30.0–36.0)
MCV: 97.5 fL (ref 78.0–100.0)
Platelets: 148 K/uL — ABNORMAL LOW (ref 150–400)
RBC: 4.07 MIL/uL (ref 3.87–5.11)
RDW: 13.3 % (ref 11.5–15.5)
WBC: 8 K/uL (ref 4.0–10.5)

## 2015-10-19 LAB — URINALYSIS, ROUTINE W REFLEX MICROSCOPIC
Bilirubin Urine: NEGATIVE
Glucose, UA: NEGATIVE mg/dL
Ketones, ur: NEGATIVE mg/dL
Nitrite: NEGATIVE
Protein, ur: NEGATIVE mg/dL
Specific Gravity, Urine: 1.01 (ref 1.005–1.030)
pH: 5 (ref 5.0–8.0)

## 2015-10-19 LAB — TROPONIN I: Troponin I: 0.03 ng/mL (ref <0.031)

## 2015-10-19 LAB — PROTIME-INR
INR: 2.96 — ABNORMAL HIGH (ref 0.00–1.49)
Prothrombin Time: 30.3 seconds — ABNORMAL HIGH (ref 11.6–15.2)

## 2015-10-19 MED ORDER — OXYCODONE HCL 10 MG PO TABS: 5.0000 mg | ORAL_TABLET | ORAL | Status: DC | PRN

## 2015-10-19 MED ORDER — DILTIAZEM HCL ER COATED BEADS 180 MG PO CP24
180.0000 mg | ORAL_CAPSULE | Freq: Every day | ORAL | Status: DC
  Administered 2015-10-20: 180 mg via ORAL
  Filled 2015-10-19: qty 1

## 2015-10-19 MED ORDER — POLYETHYLENE GLYCOL 3350 17 G PO PACK: 17.0000 g | PACK | Freq: Every day | ORAL | Status: DC

## 2015-10-19 MED ORDER — DILTIAZEM HCL 25 MG/5ML IV SOLN
5.0000 mg | Freq: Once | INTRAVENOUS | Status: AC
  Administered 2015-10-19: 5 mg via INTRAVENOUS
  Filled 2015-10-19 (×2): qty 5

## 2015-10-19 MED ORDER — TRAZODONE HCL 50 MG PO TABS
25.0000 mg | ORAL_TABLET | Freq: Every day | ORAL | Status: DC
  Administered 2015-10-19: 25 mg via ORAL
  Filled 2015-10-19: qty 1

## 2015-10-19 MED ORDER — METHOCARBAMOL 500 MG PO TABS
500.0000 mg | ORAL_TABLET | Freq: Four times a day (QID) | ORAL | Status: DC | PRN
  Administered 2015-10-19 – 2015-10-20 (×2): 500 mg via ORAL
  Filled 2015-10-19 (×2): qty 1

## 2015-10-19 MED ORDER — FENTANYL CITRATE (PF) 100 MCG/2ML IJ SOLN
50.0000 ug | Freq: Once | INTRAMUSCULAR | Status: AC
  Administered 2015-10-19: 50 ug via INTRAVENOUS
  Filled 2015-10-19: qty 2

## 2015-10-19 MED ORDER — BISACODYL 10 MG RE SUPP: 10.0000 mg | Freq: Every day | RECTAL | Status: DC | PRN

## 2015-10-19 MED ORDER — ALBUTEROL SULFATE HFA 108 (90 BASE) MCG/ACT IN AERS: 1.0000 | INHALATION_SPRAY | RESPIRATORY_TRACT | Status: DC | PRN

## 2015-10-19 MED ORDER — FERROUS SULFATE 325 (65 FE) MG PO TABS
325.0000 mg | ORAL_TABLET | Freq: Every day | ORAL | Status: DC
  Administered 2015-10-20: 325 mg via ORAL
  Filled 2015-10-19: qty 1

## 2015-10-19 MED ORDER — LEVOTHYROXINE SODIUM 75 MCG PO TABS
37.5000 ug | ORAL_TABLET | Freq: Every day | ORAL | Status: DC
  Administered 2015-10-20: 37.5 ug via ORAL
  Filled 2015-10-19: qty 1

## 2015-10-19 MED ORDER — OXYCODONE HCL 5 MG PO TABS
5.0000 mg | ORAL_TABLET | ORAL | Status: DC | PRN
  Administered 2015-10-19: 10 mg via ORAL
  Administered 2015-10-19: 5 mg via ORAL
  Administered 2015-10-20 (×2): 10 mg via ORAL
  Filled 2015-10-19: qty 2
  Filled 2015-10-19: qty 1
  Filled 2015-10-19 (×2): qty 2

## 2015-10-19 MED ORDER — PANTOPRAZOLE SODIUM 40 MG PO TBEC: 40.0000 mg | DELAYED_RELEASE_TABLET | Freq: Every day | ORAL | Status: DC

## 2015-10-19 MED ORDER — ALBUTEROL SULFATE (2.5 MG/3ML) 0.083% IN NEBU: 3.0000 mL | INHALATION_SOLUTION | RESPIRATORY_TRACT | Status: DC | PRN

## 2015-10-19 MED ORDER — SERTRALINE HCL 25 MG PO TABS
12.5000 mg | ORAL_TABLET | Freq: Two times a day (BID) | ORAL | Status: DC
  Administered 2015-10-19: 12.5 mg via ORAL
  Filled 2015-10-19: qty 1

## 2015-10-19 MED ORDER — VITAMIN K1 10 MG/ML IJ SOLN
10.0000 mg | Freq: Once | INTRAMUSCULAR | Status: AC
  Administered 2015-10-19: 10 mg via INTRAVENOUS
  Filled 2015-10-19: qty 1

## 2015-10-19 MED ORDER — DEXTROSE 5 % IV SOLN
500.0000 mg | Freq: Four times a day (QID) | INTRAVENOUS | Status: DC | PRN
  Filled 2015-10-19: qty 5

## 2015-10-19 MED ORDER — TRANDOLAPRIL 1 MG PO TABS
1.0000 mg | ORAL_TABLET | Freq: Every day | ORAL | Status: DC
  Administered 2015-10-20: 1 mg via ORAL
  Filled 2015-10-19: qty 1

## 2015-10-19 MED ORDER — SODIUM CHLORIDE 0.9 % IV BOLUS (SEPSIS)
500.0000 mL | Freq: Once | INTRAVENOUS | Status: AC
  Administered 2015-10-19: 500 mL via INTRAVENOUS

## 2015-10-19 MED ORDER — FUROSEMIDE 20 MG PO TABS
40.0000 mg | ORAL_TABLET | Freq: Two times a day (BID) | ORAL | Status: DC
Start: 2015-10-19 — End: 2015-10-19

## 2015-10-19 MED ORDER — DOCUSATE SODIUM 100 MG PO CAPS
100.0000 mg | ORAL_CAPSULE | Freq: Two times a day (BID) | ORAL | Status: DC
  Administered 2015-10-19: 100 mg via ORAL
  Filled 2015-10-19: qty 1

## 2015-10-19 NOTE — ED Notes (Signed)
Off unit with xry

## 2015-10-19 NOTE — Progress Notes (Signed)
RN notified Hospitalists via page that patient is sinus tachy >140 as notified from central telemetry. Nursing will continue to monitor.

## 2015-10-19 NOTE — Consult Note (Signed)
Reason for Consult:  Right hip fracture Referring Physician: EDP  [name] is an 82 y.o. female.  HPI:   82 yo female nursing home resident with dementia who sustained some type of mechanical fall.  Was brought to the Ed and found to have a complex proximal right hip fracture.  Both Ortho and [facility] are consulted.  She does not have family, but does have a care giver at the bedside who stays with her.  Past Medical History  Diagnosis Date  . Arthritis   . Chronic atrial fibrillation (HCC)       chronic coumadin anticoagulation  . Hypertension   . Esophageal reflux     with paraesophageal hernia  . Diverticulosis   . Spinal stenosis     with chronic LBP  . Anemia   . Vitamin D deficiency   . Osteoporosis   . UTI (lower urinary tract infection)   . Tachy-brady syndrome ([place])   . Varicose veins   . Fatigue   . History of RIND (reversible ischemic neurological deficit)   . Hypothyroidism   . Complication of anesthesia     "very sensitive to it; hard for me to wakeup"  . Breast cancer ([place])     right  . Kidney stones   . Angina   . Pacemaker -[employer]     DOI 2003/ Change out 2013  . UTI (lower urinary tract infection)   . Stage III chronic kidney disease   . Alzheimer's disease   . Diabetes mellitus without complication [facility])     Past Surgical History  Procedure Laterality Date  . Eye surgery      cataract bilateral  . Breast lumpectomy      right  . Tonsillectomy      "as a child"  . Appendectomy  1939  . Oophorectomy  1951    right; "had tumor there"  . Insert / replace / remove pacemaker Left     Dr Caryl Comes is cardiologist  . Pacemaker generator change N/A 11/01/2011    Procedure: PACEMAKER GENERATOR CHANGE;  Surgeon: Deboraha Sprang, MD;  Location: [facility] CATH LAB;  Service: Cardiovascular;  Laterality: N/A;    Family History  Problem Relation Age of Onset  . Cancer Sister     Social History:  reports that she has never smoked. She has  never used smokeless tobacco. She reports that she does not drink alcohol or use illicit drugs.  Allergies:  Allergies  Allergen Reactions  . Aspirin Shortness Of Breath  . Ceftin [Cefuroxime Axetil] Shortness Of Breath and Itching  . Amiodarone Other (See Comments)    dizziness  . Micardis [Telmisartan] Other (See Comments)    Fatigue and back pain  . Plavix [Clopidogrel Bisulfate] Other (See Comments)    dizziness  . Brompheniramine Other (See Comments)    Unknown reaction  . Benzyl Alcohol Rash    Derivatives also   . Caffeine Rash  . Chlordiazepoxide-Clidinium Rash  . Chocolate Rash  . Codeine Phosphate Rash  . Conjugated Estrogens Rash  . Darvon Rash  . Dilaudid [Hydromorphone Hcl] Rash  . Dimetapp Cold-Allergy Rash  . Dyazide [Hydrochlorothiazide W-Triamterene] Rash  . Erythromycin Rash  . Estrogens Rash  . Fungizone [Amphotericin B] Rash    Unsure if fungi allergy or amphotericin B allergy?  . Guaifenesin & Derivatives Rash  . Ibuprofen Rash  . Influenza Vaccines Swelling  . Iodine Rash  . Milk [Dairy] Diarrhea  . Nitroglycerin Other (See Comments)  Head ache   . Other Other (See Comments)    dog and cat dander, mold, mildew, fungi, dust, causes sneezing and runny nose   . Penicillins Rash    Childhood allergy? No other information available at this time. Pt was given amoxicillin on 09/22/15 with no reaction   . Petroleum Jelly [Petrolatum] Swelling    Lipsticks also (lip swelling)  . Pineapple Rash  . Prednisone Rash  . Strawberry Extract Rash  . Sulfamethoxazole Rash  . Tagamet [Cimetidine] Rash       . Talwin [Pentazocine] Rash  . Tape Rash    Paper tape irritates her skin  . Zithromax [Azithromycin Dihydrate] Rash    Medications: I have reviewed the patient's current medications.  Results for orders placed or performed during the hospital encounter of 10/19/15 (from the past 48 hour(s))  Basic metabolic panel     Status: Abnormal   Collection  Time: 10/19/15  2:06 PM  Result Value Ref Range   Sodium 141 135 - 145 mmol/L   Potassium 4.1 3.5 - 5.1 mmol/L   Chloride 101 101 - 111 mmol/L   CO2 28 22 - 32 mmol/L   Glucose, Bld 154 (H) 65 - 99 mg/dL   BUN 28 (H) 6 - 20 mg/dL   Creatinine, Ser 1.43 (H) 0.44 - 1.00 mg/dL   Calcium 8.8 (L) 8.9 - 10.3 mg/dL   GFR calc non Af Amer 31 (L) >60 mL/min   GFR calc Af Amer 35 (L) >60 mL/min    Comment: (NOTE) The eGFR has been calculated using the CKD EPI equation. This calculation has not been validated in all clinical situations. eGFR's persistently <60 mL/min signify possible Chronic Kidney Disease.    Anion gap 12 5 - 15  Protime-INR     Status: Abnormal   Collection Time: 10/19/15  2:06 PM  Result Value Ref Range   Prothrombin Time 30.3 (H) 11.6 - 15.2 seconds   INR 2.96 (H) 0.00 - 1.49  CBC     Status: Abnormal   Collection Time: 10/19/15  2:06 PM  Result Value Ref Range   WBC 8.0 4.0 - 10.5 K/uL   RBC 4.07 3.87 - 5.11 MIL/uL   Hemoglobin 12.8 12.0 - 15.0 g/dL   HCT 39.7 36.0 - 46.0 %   MCV 97.5 78.0 - 100.0 fL   MCH 31.4 26.0 - 34.0 pg   MCHC 32.2 30.0 - 36.0 g/dL   RDW 13.3 11.5 - 15.5 %   Platelets 148 (L) 150 - 400 K/uL  Urinalysis, Routine w reflex microscopic (not at [facility])     Status: Abnormal   Collection Time: 10/19/15  4:30 PM  Result Value Ref Range   Color, Urine YELLOW YELLOW   APPearance CLEAR CLEAR   Specific Gravity, Urine 1.010 1.005 - 1.030   pH 5.0 5.0 - 8.0   Glucose, UA NEGATIVE NEGATIVE mg/dL   Hgb urine dipstick TRACE (A) NEGATIVE   Bilirubin Urine NEGATIVE NEGATIVE   Ketones, ur NEGATIVE NEGATIVE mg/dL   Protein, ur NEGATIVE NEGATIVE mg/dL   Nitrite NEGATIVE NEGATIVE   Leukocytes, UA MODERATE (A) NEGATIVE  Urine microscopic-add on     Status: Abnormal   Collection Time: 10/19/15  4:30 PM  Result Value Ref Range   Squamous Epithelial / LPF 0-5 (A) NONE SEEN   WBC, UA 6-30 0 - 5 WBC/hpf    Comment: SOME IN CLUMPS.   RBC / HPF 0-5 0 - 5  RBC/hpf   Bacteria,  UA FEW (A) NONE SEEN   Casts HYALINE CASTS (A) NEGATIVE   Urine-Other MUCOUS PRESENT   Troponin I     Status: None   Collection Time: 10/19/15  6:32 PM  Result Value Ref Range   Troponin I 0.03 <0.031 ng/mL    Comment:        NO INDICATION OF MYOCARDIAL INJURY.     Ct Head Wo Contrast  10/19/2015  CLINICAL DATA:  Fall.  Head trauma.  Anticoagulation. EXAM: CT HEAD WITHOUT CONTRAST CT CERVICAL SPINE WITHOUT CONTRAST TECHNIQUE: Multidetector CT imaging of the head and cervical spine was performed following the standard protocol without intravenous contrast. Multiplanar CT image reconstructions of the cervical spine were also generated. COMPARISON:  CT head 06/19/2014 FINDINGS: CT HEAD FINDINGS Advanced atrophy with extensive chronic microvascular ischemic change in the white matter. Negative for acute infarct.  Negative for hemorrhage or mass No skull fracture.  Bilateral mastoidectomy defects. CT CERVICAL SPINE FINDINGS Mild anterior slip C4-5 and C5-6 related to disc and facet degeneration. Mild disc and facet degeneration at L3-4. Negative for fracture or mass in the cervical spine. Apical emphysema. IMPRESSION: Advanced atrophy and chronic microvascular ischemia. No acute intracranial abnormality. Negative for cervical spine fracture Electronically Signed   By: Franchot Gallo M.D.   On: 10/19/2015 14:52   Ct Cervical Spine Wo Contrast  10/19/2015  CLINICAL DATA:  Fall.  Head trauma.  Anticoagulation. EXAM: CT HEAD WITHOUT CONTRAST CT CERVICAL SPINE WITHOUT CONTRAST TECHNIQUE: Multidetector CT imaging of the head and cervical spine was performed following the standard protocol without intravenous contrast. Multiplanar CT image reconstructions of the cervical spine were also generated. COMPARISON:  CT head 06/19/2014 FINDINGS: CT HEAD FINDINGS Advanced atrophy with extensive chronic microvascular ischemic change in the white matter. Negative for acute infarct.  Negative for  hemorrhage or mass No skull fracture.  Bilateral mastoidectomy defects. CT CERVICAL SPINE FINDINGS Mild anterior slip C4-5 and C5-6 related to disc and facet degeneration. Mild disc and facet degeneration at L3-4. Negative for fracture or mass in the cervical spine. Apical emphysema. IMPRESSION: Advanced atrophy and chronic microvascular ischemia. No acute intracranial abnormality. Negative for cervical spine fracture Electronically Signed   By: Franchot Gallo M.D.   On: 10/19/2015 14:52   Chest Portable 1 View  10/19/2015  CLINICAL DATA:  Hip fracture EXAM: PORTABLE CHEST 1 VIEW COMPARISON:  03/30/2015 FINDINGS: Linear scarring/ atelectasis in the right lower lobe. Mild patchy left lower lobe opacity, likely atelectasis. Eventration of the right hemidiaphragm, chronic. No pleural effusion or pneumothorax. Cardiomegaly.  Left subclavian pacemaker. IMPRESSION: No evidence of acute cardiopulmonary disease. Electronically Signed   By: Julian Hy M.D.   On: 10/19/2015 19:13   Dg Hip Unilat With Pelvis 2-3 Views Right  10/19/2015  CLINICAL DATA:  Fall.  Hip pain EXAM: DG HIP (WITH OR WITHOUT PELVIS) 2-3V RIGHT COMPARISON:  None. FINDINGS: Intertrochanteric fracture right femur with angulation and mild displacement. Hip joint is normal. No other pelvic fracture identified. IMPRESSION: Angulated intertrochanteric femur fracture on the right. Electronically Signed   By: Franchot Gallo M.D.   On: 10/19/2015 14:54    ROS Blood pressure 124/64, pulse 127, temperature 98.4 F (36.9 C), temperature source Oral, resp. rate 20, height 5' 5" (1.651 m), weight 76.204 kg (168 lb), SpO2 95 %. Physical Exam  Constitutional: She appears well-developed and well-nourished.  Cardiovascular: Normal rate.   Respiratory: Effort normal and breath sounds normal.  GI: Soft. Bowel sounds are normal.  Musculoskeletal:  Right hip: She exhibits decreased range of motion, decreased strength, tenderness, bony tenderness and  deformity.   She is normal alert or oriented at the moment, but does respond to pain. Her right leg is shortened and externally rotated. Her right foot is well perfused   Assessment/Plan: Right complex hip/proximal femur fracture 1)  I spoke with her care giver at the bedside who also spoke with the healthcare power of attorney and informed consent is obtained.  Surgery will be for pain control/quality of life purposes, and this is understood by the care giver.  Surgery will be planned for tomorrow late afternoon once INR is lower.  Mcarthur Rossetti 10/19/2015, 9:37 PM

## 2015-10-19 NOTE — H&P (Signed)
[facility] History and Physical  [name] [id number] DOB: 01-May-1920 DOA: 10/19/2015  Referring physician: Dolly Rias PCP: Abigail Miyamoto, MD   Chief Complaint: fall  HPI: [name] is a pleasant 82 y.o. female past medical history that includes A. fib she is on Coumadin, hypertension, osteoporosis, tachybradycardia syndrome, since emergency department from facility with the chief complaint of fall. Initial evaluation in the emergency department reveals right femur fracture.  Information is obtained from the caregiver is at the bedside as well as the chart patient has dimension information is unreliable. Patient suffered a mechanical fall at the facility this morning. Patient was a no living with walker went to change direction and she lost her balance. Reportedly she slid down onto her right side and hit her head. No report of loss of consciousness. EMS was called and they noted no deformity the patient complained of right hip pain. Caregiver reports no recent illness. No fever chills cough nausea vomiting diarrhea. No complaints of chest pain palpitations headache dizziness syncope or near-syncope.   In the emergency department she is afebrile hemodynamically stable and not hypoxic.    Review of Systems:  10 point review of systems complete with caregiver and chart review as information from patient is unreliable and all systems are negative except as indicated in the history of present illness  Past Medical History  Diagnosis Date  . Arthritis   . Chronic atrial fibrillation (HCC)       chronic coumadin anticoagulation  . Hypertension   . Esophageal reflux     with paraesophageal hernia  . Diverticulosis   . Spinal stenosis     with chronic LBP  . Anemia   . Vitamin D deficiency   . Osteoporosis   . UTI (lower urinary tract infection)   . Tachy-brady syndrome ([place])   . Varicose veins   . Fatigue   . History of RIND (reversible ischemic  neurological deficit)   . Hypothyroidism   . Complication of anesthesia     "very sensitive to it; hard for me to wakeup"  . Breast cancer ([place])     right  . Kidney stones   . Angina   . Pacemaker -[employer]     DOI 2003/ Change out 2013  . UTI (lower urinary tract infection)   . Stage III chronic kidney disease   . Alzheimer's disease   . Diabetes mellitus without complication [facility])    Past Surgical History  Procedure Laterality Date  . Eye surgery      cataract bilateral  . Breast lumpectomy      right  . Tonsillectomy      "as a child"  . Appendectomy  1939  . Oophorectomy  1951    right; "had tumor there"  . Insert / replace / remove pacemaker Left     Dr Caryl Comes is cardiologist  . Pacemaker generator change N/A 11/01/2011    Procedure: PACEMAKER GENERATOR CHANGE;  Surgeon: Deboraha Sprang, MD;  Location[facility] CATH LAB;  Service: Cardiovascular;  Laterality: N/A;   Social History:  reports that she has never smoked. She has never used smokeless tobacco. She reports that she does not drink alcohol or use illicit drugs. She lives at independent living facility she has caregiver at the facility she uses a walker minimal assist she's able to make her wants and needs known Allergies  Allergen Reactions  . Aspirin Shortness Of Breath  . Ceftin Cefuroxime Axetil] Shortness Of Breath and Itching  .  Amiodarone Other (See Comments)    dizziness  . Micardis Telmisartan Other (See Comments)    Fatigue and back pain  . Plavix Clopidogrel Bisulfate Other (See Comments)    dizziness  . Brompheniramine Other (See Comments)    Unknown reaction  . Benzyl Alcohol Rash    Derivatives also   . Caffeine Rash  . Chlordiazepoxide-Clidinium Rash  . Chocolate Rash  . Codeine Phosphate Rash  . Conjugated Estrogens Rash  . Darvon Rash  . Dilaudid Hydromorphone Hcl Rash  . Dimetapp Cold-Allergy Rash  . Dyazide Hydrochlorothiazide W-Triamterene Rash  . Erythromycin Rash  . Estrogens Rash  .  Fungizone Amphotericin B Rash    Unsure if fungi allergy or amphotericin B allergy?  . Guaifenesin & Derivatives Rash  . Ibuprofen Rash  . Influenza Vaccines Swelling  . Iodine Rash  . Milk Dairy Diarrhea  . Nitroglycerin Other (See Comments)    Head ache   . Other Other (See Comments)    dog and cat dander, mold, mildew, fungi, dust, causes sneezing and runny nose   . Penicillins Rash    Childhood allergy? No other information available at this time. Pt was given amoxicillin on 09/22/15 with no reaction   . Petroleum Jelly Petrolatum Swelling    Lipsticks also (lip swelling)  . Pineapple Rash  . Prednisone Rash  . Strawberry Extract Rash  . Sulfamethoxazole Rash  . Tagamet Cimetidine Rash       . Talwin Pentazocine Rash  . Tape Rash    Paper tape irritates her skin  . Zithromax Azithromycin Dihydrate Rash    Family History  Problem Relation Age of Onset  . Cancer Sister     Prior to Admission medications   Medication Sig Start Date End Date Taking? Authorizing Provider  albuterol (PROAIR HFA) 108 (90 BASE) MCG/ACT inhaler Inhale 1 puff into the lungs every hour as needed for wheezing or shortness of breath.   Yes Historical Provider, MD  albuterol (PROVENTIL) (2.5 MG/3ML) 0.083% nebulizer solution Take 3 mLs (2.5 mg total) by nebulization every 2 (two) hours as needed for wheezing. 12/22/14  Yes Venetia Maxon Rama, MD  Cholecalciferol (VITAMIN D3) 5000 units TABS Take 5,000 Units by mouth daily.   Yes Historical Provider, MD  diclofenac sodium (VOLTAREN) 1 % GEL Apply 1 application topically 4 (four) times daily as needed (pain).   Yes Historical Provider, MD  diltiazem (CARDIZEM CD) 180 MG 24 hr capsule Take 180 mg by mouth daily before breakfast. Take on an empty stomach 11/29/14  Yes Historical Provider, MD  famotidine-calcium carbonate-magnesium hydroxide (PEPCID COMPLETE) 10-800-165 MG CHEW chewable tablet Chew 1 tablet by mouth 2 (two) times daily as needed (hearburn/ acid  reflux).   Yes Historical Provider, MD  ferrous sulfate 325 (65 FE) MG tablet Take 1 tablet (325 mg total) by mouth daily with breakfast. 12/22/14  Yes Christina P Rama, MD  furosemide (LASIX) 40 MG tablet Take 40 mg by mouth 2 (two) times daily.  03/18/15  Yes Historical Provider, MD  levothyroxine (SYNTHROID, LEVOTHROID) 75 MCG tablet Take 37.5 mcg by mouth daily before breakfast. Take 1 hour after diltiazem   Yes Historical Provider, MD  loratadine (CLARITIN) 10 MG tablet Take 10 mg by mouth daily.   Yes Historical Provider, MD  Methylfol-Methylcob-Acetylcyst (METAFOLBIC PLUS PO) Take 1 tablet by mouth daily with supper.    Yes Historical Provider, MD  Multiple Vitamins-Minerals (ICAPS AREDS FORMULA PO) Take 1 capsule by mouth daily with supper.  Yes Historical Provider, MD  omeprazole (PRILOSEC) 20 MG capsule Take 20 mg by mouth 2 (two) times daily before a meal.    Yes Historical Provider, MD  OVER THE COUNTER MEDICATION Place 1 drop into both eyes daily as needed (dry eyes). OTC lubricating eye drops   Yes Historical Provider, MD  Oxycodone HCl 10 MG TABS Take 5-10 mg by mouth every 4 (four) hours as needed (pain).  11/19/14  Yes Historical Provider, MD  OXYGEN Inhale into the lungs as needed (shortness of breath). 2 L   Yes Historical Provider, MD  Polyethyl Glycol-Propyl Glycol (SYSTANE OP) Place 1 drop into both eyes 2 (two) times daily.   Yes Historical Provider, MD  polyethylene glycol (MIRALAX / GLYCOLAX) packet Take 17 g by mouth daily. Mix in 8 oz of liquid and drink   Yes Historical Provider, MD  sertraline (ZOLOFT) 25 MG tablet Take 12.5 mg by mouth 2 (two) times daily.    Yes Historical Provider, MD  trandolapril (MAVIK) 1 MG tablet Take 1 mg by mouth daily.   Yes Historical Provider, MD  traZODone (DESYREL) 50 MG tablet Take 25 mg by mouth at bedtime.   Yes Historical Provider, MD  warfarin (COUMADIN) 2 MG tablet Take 2 mg by mouth See admin instructions. Take 1 tablet (2 mg) by mouth  every other day at 4pm (alternate with 3 mg tablets)   Yes Historical Provider, MD  warfarin (COUMADIN) 3 MG tablet Take 3 mg by mouth See admin instructions. Take 1 tablet (3 mg) by mouth every other day at 4pm   Yes Historical Provider, MD  potassium chloride SA (K-DUR,KLOR-CON) 20 MEQ tablet Take 1 tablet (20 mEq total) by mouth 2 (two) times daily. Patient not taking: Reported on 10/19/2015 12/22/14   Venetia Maxon Rama, MD  warfarin (COUMADIN) 1 MG tablet Take 2 mg of coumadin on 7-22 and 7-23.  Take 1 mg of coumadin on 7-24.  Need INR on 7-25. Patient not taking: Reported on 10/19/2015 04/04/15   Elmarie Shiley, MD   Physical Exam: Filed Vitals:   10/19/15 1345 10/19/15 1545 10/19/15 1600 10/19/15 1615  BP: 137/79 106/61 120/67 131/97  Pulse: 101 56 82 102  Temp:      TempSrc:      Resp: 18 16 18 19   SpO2: 96% 95% 97% 97%    Wt Readings from Last 3 Encounters:  04/04/15 78.9 kg (173 lb 15.1 oz)  12/23/14 77.474 kg (170 lb 12.8 oz)  08/15/14 77.111 kg (170 lb)    General:  Appears calm and somewhat uncomfortable slightly pale and frail Eyes: PERRL, normal lids, irises & conjunctiva ENT: grossly normal hearing, lips & tongue does membranes of her mouth are pale dry Neck: no LAD, masses or thyromegaly Cardiovascular: Tachycardic but regular, no m/r/g. No LE edema. Respiratory: CTA bilaterally, sounds somewhat distant no w/r/r. Normal respiratory effort. Abdomen: soft, ntnd  Skin: no rash or induration seen on limited exam Musculoskeletal: grossly normal tone BUE/BLE Right hip tender to palpation  Psychiatric: grossly normal mood and affect, speech fluent and appropriate Neurologic: grossly non-focal. Oriented to self only attempts to follow commands           Labs on Admission:  Basic Metabolic Panel:  Recent Labs Lab 10/19/15 1406  NA 141  K 4.1  CL 101  CO2 28  GLUCOSE 154*  BUN 28*  CREATININE 1.43*  CALCIUM 8.8*   Liver Function Tests: No results for  input(s): AST, ALT,  ALKPHOS, BILITOT, PROT, ALBUMIN in the last 168 hours. No results for input(s): LIPASE, AMYLASE in the last 168 hours. No results for input(s): AMMONIA in the last 168 hours. CBC:  Recent Labs Lab 10/19/15 1406  WBC 8.0  HGB 12.8  HCT 39.7  MCV 97.5  PLT 148*   Cardiac Enzymes: No results for input(s): CKTOTAL, CKMB, CKMBINDEX, TROPONINI in the last 168 hours.  BNP (last 3 results)  Recent Labs  12/17/14 0656 12/18/14 1250  BNP 328.8* 691.4*    ProBNP (last 3 results) No results for input(s): PROBNP in the last 8760 hours.  CBG: No results for input(s): GLUCAP in the last 168 hours.  Radiological Exams on Admission: Ct Head Wo Contrast  10/19/2015  CLINICAL DATA:  Fall.  Head trauma.  Anticoagulation. EXAM: CT HEAD WITHOUT CONTRAST CT CERVICAL SPINE WITHOUT CONTRAST TECHNIQUE: Multidetector CT imaging of the head and cervical spine was performed following the standard protocol without intravenous contrast. Multiplanar CT image reconstructions of the cervical spine were also generated. COMPARISON:  CT head 06/19/2014 FINDINGS: CT HEAD FINDINGS Advanced atrophy with extensive chronic microvascular ischemic change in the white matter. Negative for acute infarct.  Negative for hemorrhage or mass No skull fracture.  Bilateral mastoidectomy defects. CT CERVICAL SPINE FINDINGS Mild anterior slip C4-5 and C5-6 related to disc and facet degeneration. Mild disc and facet degeneration at L3-4. Negative for fracture or mass in the cervical spine. Apical emphysema. IMPRESSION: Advanced atrophy and chronic microvascular ischemia. No acute intracranial abnormality. Negative for cervical spine fracture Electronically Signed   By: Franchot Gallo M.D.   On: 10/19/2015 14:52   Ct Cervical Spine Wo Contrast  10/19/2015  CLINICAL DATA:  Fall.  Head trauma.  Anticoagulation. EXAM: CT HEAD WITHOUT CONTRAST CT CERVICAL SPINE WITHOUT CONTRAST TECHNIQUE: Multidetector CT imaging of the  head and cervical spine was performed following the standard protocol without intravenous contrast. Multiplanar CT image reconstructions of the cervical spine were also generated. COMPARISON:  CT head 06/19/2014 FINDINGS: CT HEAD FINDINGS Advanced atrophy with extensive chronic microvascular ischemic change in the white matter. Negative for acute infarct.  Negative for hemorrhage or mass No skull fracture.  Bilateral mastoidectomy defects. CT CERVICAL SPINE FINDINGS Mild anterior slip C4-5 and C5-6 related to disc and facet degeneration. Mild disc and facet degeneration at L3-4. Negative for fracture or mass in the cervical spine. Apical emphysema. IMPRESSION: Advanced atrophy and chronic microvascular ischemia. No acute intracranial abnormality. Negative for cervical spine fracture Electronically Signed   By: Franchot Gallo M.D.   On: 10/19/2015 14:52   Dg Hip Unilat With Pelvis 2-3 Views Right  10/19/2015  CLINICAL DATA:  Fall.  Hip pain EXAM: DG HIP (WITH OR WITHOUT PELVIS) 2-3V RIGHT COMPARISON:  None. FINDINGS: Intertrochanteric fracture right femur with angulation and mild displacement. Hip joint is normal. No other pelvic fracture identified. IMPRESSION: Angulated intertrochanteric femur fracture on the right. Electronically Signed   By: Franchot Gallo M.D.   On: 10/19/2015 14:54    EKG: Independently reviewed. Atrial fibrillation Ventricular premature complex Borderline right axis deviation Borderline T abnormalities, inferior leads Prolonged QT interval  Assessment/Plan Principal Problem:   Hip fracture (HCC) Active Problems:   Chronic venous insufficiency   Atrial fibrillation (HCC)   Pacemaker-Mdt dual   Diastolic HF (heart failure) (HCC)   Tachy-brady syndrome (HCC)   Hypertension   Hypothyroidism   Anemia, iron deficiency   Renal failure (ARF), acute on chronic ([place])   Fall  #1. Right hip  fracture. Related to mechanical fall. X-ray with angulated intertrochanteric femur fracture on  the right. Evaluated by orthopedic surgery opined per chart patient will need reduction/internal fixation of right hip. Indicates will plan surgery tomorrow afternoon. Moderate cardiac risk. Recommend no further workup at this time -Admit to telemetry -Chest x-ray -Urinalysis -INR in a.m. -Pain management -Foley catheter -Nothing by mouth past midnight  #2. Acute on chronic renal failure. Stage II. Likely related to decreased oral intake in the setting of diuresis Creatinine 1.43 on admission chart review indicates baseline creatinine 1.05-1.1 -Gentle IV fluids -Hold nephrotoxins -Monitor urine  #3. Fall. Reportedly mechanical. She did hit her head but did not lose consciousness. CT of her head and C-spine negative for mass or fracture. -See #1 -Will need physical therapy -Likely need stress  4. A. Fib with tachybradycardia syndrome status post PPM. Patient's on Coumadin. INR 2.96.chadvasc score4. Device interrogated last mild -We'll hold Coumadin -Continue diltiazem  #5. Hypothyroidism. -Obtain TSH -Continue home medicines  Dr Ninfa Linden ortho  Code Status: DNR DVT Prophylaxis: Family Communication: care giver at bedside. POA [name] [phone] Disposition Plan: will need snf  Time spent: 75 minutes  [facility]

## 2015-10-19 NOTE — ED Notes (Signed)
[facility] brought pt from home/assisted living with witnessed fall to left side of body.  Denies hitting head. Pt unable to straighten leg due to pain in rt hip 10/10 no deformity noted.  [facility] did pelvic binding with some relief.  200 fentnl.  88% RA, 97% 2L, 150/80, 80 AFIB.  Pt A&O x4

## 2015-10-19 NOTE — ED Provider Notes (Signed)
Medical screening examination/treatment/procedure(s) were conducted as a shared visit with non-physician practitioner(s) and myself.  I personally evaluated the patient during the encounter.  Fall with hip pain. On my exam has pain with ROM, no obvious deformity. Pain with weight bearing, will XR for fx.    EKG Interpretation   Date/Time:  Sunday October 19 2015 13:27:21 EST Ventricular Rate:  99 PR Interval:    QRS Duration: 91 QT Interval:  461 QTC Calculation: 592 R Axis:   82 Text Interpretation:  Atrial fibrillation Ventricular premature complex  Borderline right axis deviation Borderline T abnormalities, inferior leads  Prolonged QT interval Confirmed by [facility] MD, Dedra Matsuo [phone]) on 10/19/2015  1:41:52 PM        Merrily Pew, MD 10/22/15 1450
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
## 2015-10-19 NOTE — Progress Notes (Signed)
Patient ID: [name], female   DOB: 02/10/1920, 82 y.o.   MRN: [id number] I have reviewed the patient's x-rays.  She will need open reduction/internal fixation of her right hip.  However, given her multiple medical problems as well as her INR of 2.96, will need to delay surgery until tomorrow afternoon (2/6).  She can be allowed to eat today and should be made NPO after midnight tonight.  Unsure of her mental status, so will need to try to contact next-of-kin if she is unable to provide informed consent.  I will see her later today.

## 2015-10-20 ENCOUNTER — Encounter (HOSPITAL_COMMUNITY)
Admission: EM | Disposition: A | Discharge status: Skilled Nursing Facility | Payer: Self-pay | Source: Home / Self Care | Attending: Family Medicine

## 2015-10-20 ENCOUNTER — Inpatient Hospital Stay (HOSPITAL_COMMUNITY): Payer: Medicare Other

## 2015-10-20 ENCOUNTER — Inpatient Hospital Stay (HOSPITAL_COMMUNITY): Payer: Medicare Other | Admitting: Certified Registered Nurse Anesthetist

## 2015-10-20 ENCOUNTER — Encounter (HOSPITAL_COMMUNITY): Payer: Self-pay | Admitting: Certified Registered Nurse Anesthetist

## 2015-10-20 DIAGNOSIS — S72009A Fracture of unspecified part of neck of unspecified femur, initial encounter for closed fracture: Secondary | ICD-10-CM

## 2015-10-20 DIAGNOSIS — W19XXXD Unspecified fall, subsequent encounter: Secondary | ICD-10-CM

## 2015-10-20 DIAGNOSIS — I482 Chronic atrial fibrillation: Secondary | ICD-10-CM

## 2015-10-20 HISTORY — PX: INTRAMEDULLARY (IM) NAIL INTERTROCHANTERIC: SHX5875

## 2015-10-20 LAB — CBC
HEMATOCRIT: 33.6 % — AB (ref 36.0–46.0)
Hemoglobin: 10.9 g/dL — ABNORMAL LOW (ref 12.0–15.0)
MCH: 31.6 pg (ref 26.0–34.0)
MCHC: 32.4 g/dL (ref 30.0–36.0)
MCV: 97.4 fL (ref 78.0–100.0)
Platelets: 135 10*3/uL — ABNORMAL LOW (ref 150–400)
RBC: 3.45 MIL/uL — ABNORMAL LOW (ref 3.87–5.11)
RDW: 13.6 % (ref 11.5–15.5)
WBC: 7 10*3/uL (ref 4.0–10.5)

## 2015-10-20 LAB — SURGICAL PCR SCREEN
MRSA, PCR: NEGATIVE
STAPHYLOCOCCUS AUREUS: NEGATIVE

## 2015-10-20 LAB — BASIC METABOLIC PANEL
Anion gap: 10 (ref 5–15)
BUN: 32 mg/dL — AB (ref 6–20)
CALCIUM: 8.7 mg/dL — AB (ref 8.9–10.3)
CO2: 30 mmol/L (ref 22–32)
CREATININE: 1.52 mg/dL — AB (ref 0.44–1.00)
Chloride: 103 mmol/L (ref 101–111)
GFR calc non Af Amer: 28 mL/min — ABNORMAL LOW (ref >60)
GFR, EST AFRICAN AMERICAN: 33 mL/min — AB (ref >60)
GLUCOSE: 147 mg/dL — AB (ref 65–99)
Potassium: 4.6 mmol/L (ref 3.5–5.1)
Sodium: 143 mmol/L (ref 135–145)

## 2015-10-20 LAB — GLUCOSE, CAPILLARY
GLUCOSE-CAPILLARY: 129 mg/dL — AB (ref 65–99)
Glucose-Capillary: 108 mg/dL — ABNORMAL HIGH (ref 65–99)

## 2015-10-20 LAB — PROTIME-INR
INR: 1.4 (ref 0.00–1.49)
PROTHROMBIN TIME: 17.3 s — AB (ref 11.6–15.2)

## 2015-10-20 LAB — TROPONIN I

## 2015-10-20 SURGERY — FIXATION, FRACTURE, INTERTROCHANTERIC, WITH INTRAMEDULLARY ROD
Anesthesia: General | Site: Hip | Laterality: Right

## 2015-10-20 MED ORDER — SUGAMMADEX SODIUM 200 MG/2ML IV SOLN
INTRAVENOUS | Status: DC | PRN
  Administered 2015-10-20: 160 mg via INTRAVENOUS

## 2015-10-20 MED ORDER — CEFAZOLIN SODIUM-DEXTROSE 2-3 GM-% IV SOLR
2.0000 g | Freq: Four times a day (QID) | INTRAVENOUS | Status: AC
  Administered 2015-10-20 – 2015-10-21 (×2): 2 g via INTRAVENOUS
  Filled 2015-10-20 (×2): qty 50

## 2015-10-20 MED ORDER — METOCLOPRAMIDE HCL 5 MG PO TABS: 5.0000 mg | ORAL_TABLET | Freq: Three times a day (TID) | ORAL | Status: DC | PRN

## 2015-10-20 MED ORDER — WARFARIN SODIUM 4 MG PO TABS
4.0000 mg | ORAL_TABLET | Freq: Once | ORAL | Status: AC
  Administered 2015-10-20: 4 mg via ORAL
  Filled 2015-10-20: qty 1

## 2015-10-20 MED ORDER — FENTANYL CITRATE (PF) 100 MCG/2ML IJ SOLN
INTRAMUSCULAR | Status: DC | PRN
  Administered 2015-10-20 (×2): 25 ug via INTRAVENOUS
  Administered 2015-10-20: 50 ug via INTRAVENOUS
  Administered 2015-10-20 (×2): 25 ug via INTRAVENOUS

## 2015-10-20 MED ORDER — HYDROCODONE-ACETAMINOPHEN 5-325 MG PO TABS
1.0000 | ORAL_TABLET | Freq: Four times a day (QID) | ORAL | Status: DC | PRN
  Administered 2015-10-20 – 2015-10-21 (×3): 2 via ORAL
  Administered 2015-10-22: 1 via ORAL
  Administered 2015-10-22: 2 via ORAL
  Filled 2015-10-20 (×5): qty 2

## 2015-10-20 MED ORDER — MORPHINE SULFATE (PF) 2 MG/ML IV SOLN
0.5000 mg | INTRAVENOUS | Status: DC | PRN
  Administered 2015-10-21 – 2015-10-22 (×3): 0.5 mg via INTRAVENOUS
  Filled 2015-10-20 (×4): qty 1

## 2015-10-20 MED ORDER — ONDANSETRON HCL 4 MG/2ML IJ SOLN: 4.0000 mg | Freq: Four times a day (QID) | INTRAMUSCULAR | Status: DC | PRN

## 2015-10-20 MED ORDER — METHOCARBAMOL 1000 MG/10ML IJ SOLN: 500.0000 mg | Freq: Four times a day (QID) | INTRAVENOUS | Status: DC | PRN

## 2015-10-20 MED ORDER — ONDANSETRON HCL 4 MG PO TABS: 4.0000 mg | ORAL_TABLET | Freq: Four times a day (QID) | ORAL | Status: DC | PRN

## 2015-10-20 MED ORDER — FENTANYL CITRATE (PF) 100 MCG/2ML IJ SOLN
INTRAMUSCULAR | Status: AC
  Filled 2015-10-20: qty 2

## 2015-10-20 MED ORDER — 0.9 % SODIUM CHLORIDE (POUR BTL) OPTIME
TOPICAL | Status: DC | PRN
  Administered 2015-10-20: 1000 mL

## 2015-10-20 MED ORDER — LACTATED RINGERS IV SOLN
INTRAVENOUS | Status: DC
  Administered 2015-10-20: 15:00:00 via INTRAVENOUS

## 2015-10-20 MED ORDER — WARFARIN - PHARMACIST DOSING INPATIENT: Freq: Every day | Status: DC

## 2015-10-20 MED ORDER — FENTANYL CITRATE (PF) 250 MCG/5ML IJ SOLN
INTRAMUSCULAR | Status: AC
  Filled 2015-10-20: qty 5

## 2015-10-20 MED ORDER — CEFAZOLIN SODIUM-DEXTROSE 2-3 GM-% IV SOLR
INTRAVENOUS | Status: DC | PRN
  Administered 2015-10-20: 2 g via INTRAVENOUS

## 2015-10-20 MED ORDER — ESMOLOL HCL 100 MG/10ML IV SOLN
INTRAVENOUS | Status: DC | PRN
  Administered 2015-10-20: 30 mg via INTRAVENOUS
  Administered 2015-10-20: 20 mg via INTRAVENOUS

## 2015-10-20 MED ORDER — FENTANYL CITRATE (PF) 100 MCG/2ML IJ SOLN
25.0000 ug | INTRAMUSCULAR | Status: DC | PRN
  Administered 2015-10-20: 25 ug via INTRAVENOUS

## 2015-10-20 MED ORDER — ACETAMINOPHEN 325 MG PO TABS: 650.0000 mg | ORAL_TABLET | Freq: Four times a day (QID) | ORAL | Status: DC | PRN

## 2015-10-20 MED ORDER — PHENYLEPHRINE HCL 10 MG/ML IJ SOLN
INTRAMUSCULAR | Status: DC | PRN
  Administered 2015-10-20: 240 ug via INTRAVENOUS
  Administered 2015-10-20: 160 ug via INTRAVENOUS

## 2015-10-20 MED ORDER — METHOCARBAMOL 500 MG PO TABS: 500.0000 mg | ORAL_TABLET | Freq: Four times a day (QID) | ORAL | Status: DC | PRN

## 2015-10-20 MED ORDER — DEXTROSE 5 % IV SOLN
10.0000 mg | INTRAVENOUS | Status: DC | PRN
  Administered 2015-10-20: 20 ug/min via INTRAVENOUS

## 2015-10-20 MED ORDER — PHENOL 1.4 % MT LIQD
1.0000 | OROMUCOSAL | Status: DC | PRN
Start: 2015-10-20 — End: 2015-10-22

## 2015-10-20 MED ORDER — LIDOCAINE HCL (CARDIAC) 20 MG/ML IV SOLN
INTRAVENOUS | Status: DC | PRN
  Administered 2015-10-20: 20 mg via INTRAVENOUS

## 2015-10-20 MED ORDER — PROPOFOL 10 MG/ML IV BOLUS
INTRAVENOUS | Status: DC | PRN
  Administered 2015-10-20: 90 mg via INTRAVENOUS

## 2015-10-20 MED ORDER — ROCURONIUM BROMIDE 100 MG/10ML IV SOLN
INTRAVENOUS | Status: DC | PRN
  Administered 2015-10-20: 30 mg via INTRAVENOUS

## 2015-10-20 MED ORDER — MENTHOL 3 MG MT LOZG: 1.0000 | LOZENGE | OROMUCOSAL | Status: DC | PRN

## 2015-10-20 MED ORDER — ONDANSETRON HCL 4 MG/2ML IJ SOLN: 4.0000 mg | Freq: Once | INTRAMUSCULAR | Status: DC | PRN

## 2015-10-20 MED ORDER — ONDANSETRON HCL 4 MG/2ML IJ SOLN
INTRAMUSCULAR | Status: DC | PRN
  Administered 2015-10-20: 4 mg via INTRAVENOUS

## 2015-10-20 MED ORDER — ENSURE ENLIVE PO LIQD: 237.0000 mL | Freq: Every day | ORAL | Status: DC

## 2015-10-20 MED ORDER — METOCLOPRAMIDE HCL 5 MG/ML IJ SOLN: 5.0000 mg | Freq: Three times a day (TID) | INTRAMUSCULAR | Status: DC | PRN

## 2015-10-20 MED ORDER — LACTATED RINGERS IV SOLN
INTRAVENOUS | Status: DC | PRN
Start: 2015-10-20 — End: 2015-10-20
  Administered 2015-10-20: 16:00:00 via INTRAVENOUS

## 2015-10-20 MED ORDER — ACETAMINOPHEN 650 MG RE SUPP: 650.0000 mg | Freq: Four times a day (QID) | RECTAL | Status: DC | PRN

## 2015-10-20 MED ORDER — SUGAMMADEX SODIUM 200 MG/2ML IV SOLN
INTRAVENOUS | Status: AC
  Filled 2015-10-20: qty 2

## 2015-10-20 SURGICAL SUPPLY — 49 items
BLADE SURG 15 STRL LF DISP TIS (BLADE) ×1 IMPLANT
BLADE SURG 15 STRL SS (BLADE) ×3
BNDG COHESIVE 4X5 TAN NS LF (GAUZE/BANDAGES/DRESSINGS) ×3 IMPLANT
BNDG GAUZE ELAST 4 BULKY (GAUZE/BANDAGES/DRESSINGS) ×3 IMPLANT
COVER PERINEAL POST (MISCELLANEOUS) ×3 IMPLANT
COVER SURGICAL LIGHT HANDLE (MISCELLANEOUS) ×3 IMPLANT
DRAPE PROXIMA HALF (DRAPES) IMPLANT
DRAPE STERI IOBAN 125X83 (DRAPES) ×3 IMPLANT
DRSG MEPILEX BORDER 4X4 (GAUZE/BANDAGES/DRESSINGS) ×4 IMPLANT
DRSG MEPILEX BORDER 4X8 (GAUZE/BANDAGES/DRESSINGS) ×1 IMPLANT
DRSG PAD ABDOMINAL 8X10 ST (GAUZE/BANDAGES/DRESSINGS) ×6 IMPLANT
DURAPREP 26ML APPLICATOR (WOUND CARE) ×3 IMPLANT
ELECT REM PT RETURN 9FT ADLT (ELECTROSURGICAL) ×3
ELECTRODE REM PT RTRN 9FT ADLT (ELECTROSURGICAL) ×1 IMPLANT
FACESHIELD WRAPAROUND (MASK) ×3 IMPLANT
FACESHIELD WRAPAROUND OR TEAM (MASK) ×1 IMPLANT
GAUZE XEROFORM 5X9 LF (GAUZE/BANDAGES/DRESSINGS) ×3 IMPLANT
GLOVE BIO SURGEON STRL SZ8 (GLOVE) ×7 IMPLANT
GLOVE BIOGEL PI IND STRL 8 (GLOVE) ×1 IMPLANT
GLOVE BIOGEL PI INDICATOR 8 (GLOVE) ×2
GLOVE ORTHO TXT STRL SZ7.5 (GLOVE) ×7 IMPLANT
GLOVE SURG ORTHO 8.0 STRL STRW (GLOVE) ×4 IMPLANT
GOWN STRL REUS W/ TWL LRG LVL3 (GOWN DISPOSABLE) ×1 IMPLANT
GOWN STRL REUS W/ TWL XL LVL3 (GOWN DISPOSABLE) ×2 IMPLANT
GOWN STRL REUS W/TWL LRG LVL3 (GOWN DISPOSABLE) ×3
GOWN STRL REUS W/TWL XL LVL3 (GOWN DISPOSABLE) ×6
GUIDE PIN 3.2MM (MISCELLANEOUS) ×3
GUIDE PIN ORTH 343X3.2XBRAD (MISCELLANEOUS) IMPLANT
KIT BASIN OR (CUSTOM PROCEDURE TRAY) ×3 IMPLANT
KIT ROOM TURNOVER OR (KITS) ×3 IMPLANT
LINER BOOT UNIVERSAL DISP (MISCELLANEOUS) ×3 IMPLANT
MANIFOLD NEPTUNE II (INSTRUMENTS) ×3 IMPLANT
NAIL TRIGEN RT 10MMX36CM-125 (Nail) ×2 IMPLANT
NS IRRIG 1000ML POUR BTL (IV SOLUTION) ×3 IMPLANT
PACK GENERAL/GYN (CUSTOM PROCEDURE TRAY) ×3 IMPLANT
PAD ARMBOARD 7.5X6 YLW CONV (MISCELLANEOUS) ×6 IMPLANT
PAD CAST 4YDX4 CTTN HI CHSV (CAST SUPPLIES) ×2 IMPLANT
PADDING CAST COTTON 4X4 STRL (CAST SUPPLIES) ×6
SCREW LAG COMPR KIT 90/85 (Screw) ×2 IMPLANT
STAPLER VISISTAT 35W (STAPLE) ×3 IMPLANT
SUT VIC AB 0 CT1 27 (SUTURE) ×6
SUT VIC AB 0 CT1 27XBRD ANBCTR (SUTURE) ×2 IMPLANT
SUT VIC AB 1 CT1 27 (SUTURE) ×3
SUT VIC AB 1 CT1 27XBRD ANBCTR (SUTURE) IMPLANT
SUT VIC AB 2-0 CT1 27 (SUTURE) ×6
SUT VIC AB 2-0 CT1 TAPERPNT 27 (SUTURE) ×2 IMPLANT
TOWEL OR 17X24 6PK STRL BLUE (TOWEL DISPOSABLE) ×3 IMPLANT
TOWEL OR 17X26 10 PK STRL BLUE (TOWEL DISPOSABLE) ×3 IMPLANT
WATER STERILE IRR 1000ML POUR (IV SOLUTION) ×3 IMPLANT

## 2015-10-20 NOTE — Anesthesia Postprocedure Evaluation (Signed)
Anesthesia Post Note  Patient: [name]  Procedure(s) Performed: Procedure(s) (LRB): INTRAMEDULLARY (IM) NAIL INTERTROCHANTRIC (Right)  Patient location during evaluation: PACU Anesthesia Type: General Level of consciousness: awake, patient cooperative and confused Pain management: pain level controlled Vital Signs Assessment: post-procedure vital signs reviewed and stable Anesthetic complications: no    Last Vitals:  Filed Vitals:   10/20/15 1810 10/20/15 1815  BP:  88/73  Pulse: 132 119  Temp:  37.2 C  Resp: 16 25    Last Pain:  Filed Vitals:   10/20/15 1817  PainSc: 0-No pain                 Dakotah Orrego COKER

## 2015-10-20 NOTE — Progress Notes (Signed)
Initial Nutrition Assessment  DOCUMENTATION CODES:   Not applicable  INTERVENTION:  Once diet advances, provide Ensure Enlive po once daily, each supplement provides 350 kcal and 20 grams of protein.  NUTRITION DIAGNOSIS:   Increased nutrient needs related to  (surgery) as evidenced by estimated needs.  GOAL:   Patient will meet greater than or equal to 90% of their needs  MONITOR:   Diet advancement, Supplement acceptance, Weight trends, I & O's, Labs, Skin  REASON FOR ASSESSMENT:   Consult Assessment of nutrition requirement/status  ASSESSMENT:   82 y.o. female past medical history that includes A. fib she is on Coumadin, hypertension, osteoporosis, tachybradycardia syndrome, since emergency department from facility with the chief complaint of fall. Initial evaluation in the emergency department reveals right femur fracture.  Pt is currently NPO for surgery today. Pt was asleep and did not wake during time of visit. Family and caregivers at bedside. They report pt has been eating well PTA with at least 2 meals a day. Weight has been stable with usual body weight at ~168-170 lbs. Pt with no observed significant fat or muscle mass loss. RD to order Ensure to aid in healing once diet advances.   Labs and medications reviewed.   Diet Order:  Diet NPO time specified  Skin:  Reviewed, no issues  Last BM:  2/4  Height:   Ht Readings from Last 1 Encounters:  10/19/15 5\' 5"  (1.651 m)    Weight:   Wt Readings from Last 1 Encounters:  10/19/15 168 lb (76.204 kg)    Ideal Body Weight:  56.8 kg  BMI:  Body mass index is 27.96 kg/(m^2).  Estimated Nutritional Needs:   Kcal:  1600-1850  Protein:  70-80 grams  Fluid:  1.6 - 1.8 L/day  EDUCATION NEEDS:   No education needs identified at this time  Corrin Parker, MS, RD, LDN Pager # [phone] After hours/ weekend pager # [phone]

## 2015-10-20 NOTE — Consult Note (Signed)
ANTICOAGULATION CONSULT NOTE - Initial Consult  Pharmacy Consult for Coumadin Indication: atrial fibrillation, s/p IM nail to hip  Allergies  Allergen Reactions  . Aspirin Shortness Of Breath  . Ceftin [Cefuroxime Axetil] Shortness Of Breath and Itching  . Amiodarone Other (See Comments)    dizziness  . Micardis [Telmisartan] Other (See Comments)    Fatigue and back pain  . Plavix [Clopidogrel Bisulfate] Other (See Comments)    dizziness  . Brompheniramine Other (See Comments)    Unknown reaction  . Benzyl Alcohol Rash    Derivatives also   . Caffeine Rash  . Chlordiazepoxide-Clidinium Rash  . Chocolate Rash  . Codeine Phosphate Rash  . Conjugated Estrogens Rash  . Darvon Rash  . Dilaudid [Hydromorphone Hcl] Rash  . Dimetapp Cold-Allergy Rash  . Dyazide [Hydrochlorothiazide W-Triamterene] Rash  . Erythromycin Rash  . Estrogens Rash  . Fungizone [Amphotericin B] Rash    Unsure if fungi allergy or amphotericin B allergy?  . Guaifenesin & Derivatives Rash  . Ibuprofen Rash  . Influenza Vaccines Swelling  . Iodine Rash  . Milk [Dairy] Diarrhea  . Nitroglycerin Other (See Comments)    Head ache   . Other Other (See Comments)    dog and cat dander, mold, mildew, fungi, dust, causes sneezing and runny nose   . Penicillins Rash    Childhood allergy? No other information available at this time. Pt was given amoxicillin on 09/22/15 with no reaction   . Petroleum Jelly [Petrolatum] Swelling    Lipsticks also (lip swelling)  . Pineapple Rash  . Prednisone Rash  . Strawberry Extract Rash  . Sulfamethoxazole Rash  . Tagamet [Cimetidine] Rash       . Talwin [Pentazocine] Rash  . Tape Rash    Paper tape irritates her skin  . Zithromax [Azithromycin Dihydrate] Rash    Patient Measurements: Height: 5\' 5"  (165.1 cm) Weight: 168 lb (76.204 kg) IBW/kg (Calculated) : 57  Vital Signs: Temp: 98.8 F (37.1 C) (02/06 1838) BP: 94/64 mmHg (02/06 1838) Pulse Rate: 115 (02/06  1838)  Labs:  Recent Labs  10/19/15 1406 10/19/15 1832 10/19/15 2200 10/20/15 0519  HGB 12.8  --   --  10.9*  HCT 39.7  --   --  33.6*  PLT 148*  --   --  135*  LABPROT 30.3*  --   --  17.3*  INR 2.96*  --   --  1.40  CREATININE 1.43*  --   --  1.52*  TROPONINI  --  0.03 <0.03 <0.03    Estimated Creatinine Clearance: 23.6 mL/min (by C-G formula based on Cr of 1.52).   Medical History: Past Medical History  Diagnosis Date  . Arthritis   . Chronic atrial fibrillation (HCC)       chronic coumadin anticoagulation  . Hypertension   . Esophageal reflux     with paraesophageal hernia  . Diverticulosis   . Spinal stenosis     with chronic LBP  . Anemia   . Vitamin D deficiency   . Osteoporosis   . UTI (lower urinary tract infection)   . Tachy-brady syndrome ([place])   . Varicose veins   . Fatigue   . History of RIND (reversible ischemic neurological deficit)   . Hypothyroidism   . Complication of anesthesia     "very sensitive to it; hard for me to wakeup"  . Breast cancer ([place])     right  . Kidney stones   . Angina   .  Pacemaker -[employer]tr[employer]     DOI 2003/ Change out 2013  . UTI (lower urinary tract infection)   . Stage III chronic kidney disease   . Alzheimer's disease   . Diabetes mellitus without complication ([place])    Assessment: 89yof on coumadin pta for afib, now s/p IM nail to hip, to resume coumadin. INR 2.96 on admit and she received 10mg  vitamin k to reverse for OR. INR 1.4 today. May require higher doses of coumadin initially to overcome vitamin k resistance.  Home dose: 2mg  alternating with 3mg  - last dose 2/4  Goal of Therapy:  INR 2-3 Monitor platelets by anticoagulation protocol: Yes   Plan:  1) Coumadin 4mg  x 1 2) Daily INR  Deboraha Sprang 10/20/2015,6:39 PM

## 2015-10-20 NOTE — Progress Notes (Signed)
[name] [id number] DOB: [birth year] DOA: 10/19/2015 PCP: Abigail Miyamoto, MD  Brief narrative:  82 year old female  atrial fibrillation+ tachybradycardia syndrome CHad2Vasc2 score=on Coumadin status post pacemaker2000 4 initially, replacement 2014 Lexiscan 11/2011 showed no evidence for ischemia and EF 66% Hypothyroidism Anemia Chronic kidney disease Hyperglycemia breast cancer status post lumpectomy 06/2006 Lumbar spinal stenosis TIAs  Remote history of DVT  Presented to Hospital from independent living facility 10/18/14 with history of fall and hip fracture Moderate risk about 3.6% for perioperative complications cleared for surgery2/5/70   Past medical history-As per Problem list Chart reviewed as below- ortho  Consultants:    Procedures:    Antibiotics:     Subjective  Alert but disoriented Talking non-sensically Aide at bedside   Objective    Interim History:   Telemetry:    Objective: Filed Vitals:   10/19/15 1745 10/19/15 1834 10/19/15 2009 10/20/15 0420  BP: 126/97 121/57 124/64 126/69  Pulse: 99 110 127 84  Temp:  97.2 F (36.2 C) 98.4 F (36.9 C) 97.5 F (36.4 C)  TempSrc:  Oral Oral Oral  Resp: 22 20 20 20   Height:  5\' 5"  (1.651 m)    Weight:  76.204 kg (168 lb)    SpO2: 98% 91% 95% 95%    Intake/Output Summary (Last 24 hours) at 10/20/15 0742 Last data filed at 10/20/15 0431  Gross per 24 hour  Intake      0 ml  Output    300 ml  Net   -300 ml    Exam:  General: eomi ncat no hjavd Cardiovascular: s1 s 2no m/r/g Respiratory: clear Abdomen:  Soft nt nd no rebound Skin no le edema Neuro intact  Data Reviewed: Basic Metabolic Panel:  Recent Labs Lab 10/19/15 1406 10/20/15 0519  NA 141 143  K 4.1 4.6  CL 101 103  CO2 28 30  GLUCOSE 154* 147*  BUN 28* 32*  CREATININE 1.43* 1.52*  CALCIUM 8.8* 8.7*   Liver Function Tests: No results for input(s): AST, ALT, ALKPHOS, BILITOT, PROT, ALBUMIN in the last  168 hours. No results for input(s): LIPASE, AMYLASE in the last 168 hours. No results for input(s): AMMONIA in the last 168 hours. CBC:  Recent Labs Lab 10/19/15 1406 10/20/15 0519  WBC 8.0 7.0  HGB 12.8 10.9*  HCT 39.7 33.6*  MCV 97.5 97.4  PLT 148* 135*   Cardiac Enzymes:  Recent Labs Lab 10/19/15 1832 10/19/15 2200 10/20/15 0519  TROPONINI 0.03 <0.03 <0.03   BNP: Invalid input(s): POCBNP CBG: No results for input(s): GLUCAP in the last 168 hours.  No results found for this or any previous visit (from the past 240 hour(s)).   Studies:              All Imaging reviewed and is as per above notation   Scheduled Meds: . diltiazem  180 mg Oral QAC breakfast  . docusate sodium  100 mg Oral BID  . ferrous sulfate  325 mg Oral Q breakfast  . levothyroxine  37.5 mcg Oral QAC breakfast  . pantoprazole  40 mg Oral Daily  . polyethylene glycol  17 g Oral Daily  . sertraline  12.5 mg Oral BID  . trandolapril  1 mg Oral Daily  . traZODone  25 mg Oral QHS   Continuous Infusions:    Assessment/Plan:   HIp #-for surgery   per discretion Ortho services-appreciate their input into  Anticoagulation post-op Weight bearing tolerance for therapy services Wound care  Pain management Follow-up services required 1. Afib CHad2Vasc2 score=5 or 6 withTachybrady syndrome s/p popm-Continue Cardizem 180 CD.  No contraindication for sx.  Resume AC as per ORtho 2. Prior TIA-monitor-likely 2/2 to #1.  No role dual Rx with anti-platelet 3. Hypothyroid-cotninue Synthroid 37.5 mcg daily. 4. AKI-IVF to continue 5. Dementia-mod-severe-will continue Sertraline 25 bid, Trazadone 25 qhs and re-orient as possible 6. Iron def anemia-monitor am labs.     D/w caregiver bedside Inpatient pending resolution Probably will need SNF on d/c    Verneita Griffes, MD  [facility] Pager [phone] 10/20/2015, 7:42 AM    LOS: 1 day

## 2015-10-20 NOTE — Anesthesia Procedure Notes (Signed)
Procedure Name: Intubation Date/Time: 10/20/2015 4:10 PM Performed by: Salli Quarry Velicia Dejager Pre-anesthesia Checklist: Patient identified, Emergency Drugs available, Suction available and Patient being monitored Patient Re-evaluated:Patient Re-evaluated prior to inductionOxygen Delivery Method: Circle system utilized Preoxygenation: Pre-oxygenation with 100% oxygen Intubation Type: IV induction Ventilation: Mask ventilation without difficulty Laryngoscope Size: Mac and 3 Grade View: Grade III Tube type: Oral Tube size: 7.5 mm Number of attempts: 1 Airway Equipment and Method: Stylet Placement Confirmation: ETT inserted through vocal cords under direct vision,  positive ETCO2 and breath sounds checked- equal and bilateral Secured at: 21 cm Tube secured with: Tape Dental Injury: Teeth and Oropharynx as per pre-operative assessment

## 2015-10-20 NOTE — Brief Op Note (Signed)
10/19/2015 - 10/20/2015  5:12 PM  PATIENT:  [name]  82 y.o. female  PRE-OPERATIVE DIAGNOSIS:  right intertrochanteric hip fracture  POST-OPERATIVE DIAGNOSIS:  right intertrochanteric hip fracture  PROCEDURE:  Procedure(s): INTRAMEDULLARY (IM) NAIL INTERTROCHANTRIC (Right)  SURGEON:  Surgeon(s) and Role:    * Mcarthur Rossetti, MD - Primary  PHYSICIAN ASSISTANT: Benita Stabile, PA-C  ANESTHESIA:   general  EBL:   < 200 cc  COUNTS:  YES  DICTATION: .Other Dictation: Dictation Number [id number]  PLAN OF CARE: Admit to inpatient   PATIENT DISPOSITION:  PACU - hemodynamically stable.   Delay start of Pharmacological VTE agent (>24hrs) due to surgical blood loss or risk of bleeding: no

## 2015-10-20 NOTE — Anesthesia Preprocedure Evaluation (Addendum)
Anesthesia Evaluation  Patient identified by MRN, date of birth, ID band Patient awake    Reviewed: Allergy & Precautions, NPO status , Patient's Chart, lab work & pertinent test results  History of Anesthesia Complications (+) history of anesthetic complications (slow to wake up)  Airway Mallampati: II  TM Distance: >3 FB     Dental  (+) Teeth Intact, Dental Advisory Given   Pulmonary     + decreased breath sounds      Cardiovascular hypertension, Pt. on medications + Peripheral Vascular Disease and +CHF  + pacemaker  Rhythm:Irregular Rate:Normal     Neuro/Psych PSYCHIATRIC DISORDERS (alzheimer's)    GI/Hepatic GERD  ,  Endo/Other  diabetesHypothyroidism   Renal/GU Renal disease     Musculoskeletal   Abdominal   Peds  Hematology   Anesthesia Other Findings   Reproductive/Obstetrics                            Anesthesia Physical Anesthesia Plan  ASA: III  Anesthesia Plan: General   Post-op Pain Management:    Induction: Intravenous  Airway Management Planned: Oral ETT  Additional Equipment:   Intra-op Plan:   Post-operative Plan: Extubation in OR  Informed Consent: I have reviewed the patients History and Physical, chart, labs and discussed the procedure including the risks, benefits and alternatives for the proposed anesthesia with the patient or authorized representative who has indicated his/her understanding and acceptance.   Dental advisory given  Plan Discussed with: CRNA and Anesthesiologist  Anesthesia Plan Comments: (R. Intertrochanteric fracture Chronic atrial fibrillation EF 60-65% by cariolite 2013, no ischemia noted Dementia  Renal insufficiency Cr 1.53  Plan GA with oral ETT  Roberts Gaudy  )        Anesthesia Quick Evaluation

## 2015-10-20 NOTE — Transfer of Care (Signed)
Immediate Anesthesia Transfer of Care Note  Patient: [name]  Procedure(s) Performed: Procedure(s): INTRAMEDULLARY (IM) NAIL INTERTROCHANTRIC (Right)  Patient Location: PACU  Anesthesia Type:General  Level of Consciousness: awake, alert  and patient cooperative  Airway & Oxygen Therapy: Patient Spontanous Breathing and Patient connected to nasal cannula oxygen  Post-op Assessment: Report given to RN and Post -op Vital signs reviewed and stable  Post vital signs: Reviewed and stable  Last Vitals:  Filed Vitals:   10/19/15 2009 10/20/15 0420  BP: 124/64 126/69  Pulse: 127 84  Temp: 36.9 C 36.4 C  Resp: 20 20    Complications: No apparent anesthesia complications

## 2015-10-21 ENCOUNTER — Encounter (HOSPITAL_COMMUNITY): Payer: Self-pay | Admitting: Orthopaedic Surgery

## 2015-10-21 DIAGNOSIS — I5032 Chronic diastolic (congestive) heart failure: Secondary | ICD-10-CM

## 2015-10-21 DIAGNOSIS — E039 Hypothyroidism, unspecified: Secondary | ICD-10-CM

## 2015-10-21 LAB — CBC
HEMATOCRIT: 28.1 % — AB (ref 36.0–46.0)
Hemoglobin: 8.7 g/dL — ABNORMAL LOW (ref 12.0–15.0)
MCH: 30.5 pg (ref 26.0–34.0)
MCHC: 31 g/dL (ref 30.0–36.0)
MCV: 98.6 fL (ref 78.0–100.0)
PLATELETS: 124 10*3/uL — AB (ref 150–400)
RBC: 2.85 MIL/uL — AB (ref 3.87–5.11)
RDW: 13.7 % (ref 11.5–15.5)
WBC: 7.9 10*3/uL (ref 4.0–10.5)

## 2015-10-21 LAB — BASIC METABOLIC PANEL
ANION GAP: 10 (ref 5–15)
BUN: 39 mg/dL — AB (ref 6–20)
CO2: 29 mmol/L (ref 22–32)
Calcium: 8.3 mg/dL — ABNORMAL LOW (ref 8.9–10.3)
Chloride: 102 mmol/L (ref 101–111)
Creatinine, Ser: 1.93 mg/dL — ABNORMAL HIGH (ref 0.44–1.00)
GFR calc Af Amer: 25 mL/min — ABNORMAL LOW (ref >60)
GFR calc non Af Amer: 21 mL/min — ABNORMAL LOW (ref >60)
GLUCOSE: 136 mg/dL — AB (ref 65–99)
POTASSIUM: 4.6 mmol/L (ref 3.5–5.1)
Sodium: 141 mmol/L (ref 135–145)

## 2015-10-21 LAB — PROTIME-INR
INR: 1.26 (ref 0.00–1.49)
Prothrombin Time: 16 seconds — ABNORMAL HIGH (ref 11.6–15.2)

## 2015-10-21 MED ORDER — POLYETHYLENE GLYCOL 3350 17 G PO PACK
17.0000 g | PACK | Freq: Every day | ORAL | Status: DC
  Administered 2015-10-22: 17 g via ORAL
  Filled 2015-10-21: qty 1

## 2015-10-21 MED ORDER — SENNOSIDES 8.8 MG/5ML PO SYRP
5.0000 mL | ORAL_SOLUTION | Freq: Every day | ORAL | Status: DC
  Administered 2015-10-21: 5 mL via ORAL
  Filled 2015-10-21 (×2): qty 5

## 2015-10-21 MED ORDER — ACETAMINOPHEN 325 MG PO TABS: 650.0000 mg | ORAL_TABLET | Freq: Four times a day (QID) | ORAL | Status: DC | PRN

## 2015-10-21 MED ORDER — WARFARIN SODIUM 4 MG PO TABS
4.0000 mg | ORAL_TABLET | Freq: Once | ORAL | Status: AC
  Administered 2015-10-21: 4 mg via ORAL
  Filled 2015-10-21: qty 1

## 2015-10-21 MED ORDER — SODIUM CHLORIDE 0.9 % IV SOLN
INTRAVENOUS | Status: DC
  Administered 2015-10-21 – 2015-10-22 (×2): via INTRAVENOUS

## 2015-10-21 MED ORDER — METHOCARBAMOL 500 MG PO TABS
500.0000 mg | ORAL_TABLET | Freq: Four times a day (QID) | ORAL | Status: DC | PRN
  Administered 2015-10-21 – 2015-10-22 (×3): 500 mg via ORAL
  Filled 2015-10-21 (×4): qty 1

## 2015-10-21 MED ORDER — MORPHINE SULFATE (PF) 2 MG/ML IV SOLN
2.0000 mg | Freq: Once | INTRAVENOUS | Status: AC
  Administered 2015-10-21: 2 mg via INTRAVENOUS
  Filled 2015-10-21: qty 1

## 2015-10-21 NOTE — NC FL2 (Signed)
[place] LEVEL OF CARE SCREENING TOOL     IDENTIFICATION  Patient Name: [name] Birthdate: [birth year] Sex: female Admission Date (Current Location): 10/19/2015  [facility] and [place] Number:  Herbalist and Address:  [facility] [place], [place] [address] [place], [place] [address]      Provider Number: [id number]  Attending Physician Name and Address:  Nita Sells, MD  Relative Name and Phone Number:       Current Level of Care: Hospital Recommended Level of Care: [place] Prior Approval Number:    Date Approved/Denied:   PASRR Number: [id number]  Discharge Plan: SNF    Current Diagnoses: Patient Active Problem List   Diagnosis Date Noted  . Hip fracture ([place]) 10/19/2015  . Fall 10/19/2015  . Hypotension 03/31/2015  . Supratherapeutic INR 03/31/2015  . Renal failure (ARF), acute on chronic (HCC) 03/31/2015  . Sepsis ([place]) 03/30/2015  . Syncope 03/30/2015  . Nausea and vomiting with suspected aspiration 12/22/2014  . E coli bacteremia 12/22/2014  . E. coli sepsis ([place]) 12/22/2014  . Acute respiratory failure ([place]) 12/22/2014  . New onset type 2 diabetes mellitus ([place]) 12/21/2014  . Stage III chronic kidney disease 12/21/2014  . Constipation 12/21/2014  . Weakness 12/20/2014  . Anemia, iron deficiency 12/19/2014  . Acute on chronic diastolic CHF (congestive heart failure) ([place]) 12/19/2014  . Hypothyroidism 12/18/2014  . Hyperglycemia 12/18/2014  . Acute respiratory failure with hypoxia ([place]) 12/17/2014  . UTI (lower urinary tract infection) 12/17/2014  . Hypertension 01/04/2012  . Tachy-brady syndrome ([place]) 11/01/2011  . Diastolic HF (heart failure) ([place]) 10/28/2011  . Atrial fibrillation ([place]) 10/12/2011  . Pacemaker-Mdt dual 10/12/2011  . Pain in limb 10/05/2011  . Varicose veins of lower extremities with other complications [id number]  . Chronic venous insufficiency 06/25/2011     Orientation RESPIRATION BLADDER Height & Weight     Self  O2 (2L) Continent Weight: 168 lb (76.204 kg) Height:  5\' 5"  (165.1 cm)  BEHAVIORAL SYMPTOMS/MOOD NEUROLOGICAL BOWEL NUTRITION STATUS      Continent  (please check discharge summary for dietary needs)  AMBULATORY STATUS COMMUNICATION OF NEEDS Skin   Extensive Assist   Surgical wounds                       Personal Care Assistance Level of Assistance  Bathing, Dressing Bathing Assistance: Limited assistance   Dressing Assistance: Limited assistance     Functional Limitations Info             SPECIAL CARE FACTORS FREQUENCY  PT (By licensed PT), OT (By licensed OT)     PT Frequency: daily OT Frequency: daily            Contractures Contractures Info: Not present    Additional Factors Info  Allergies   Allergies Info: Aspirin, Ceftin, Amiodarone, Micardis, Plavix, Brompheniramine, Benzyl Alcohol, Caffeine, Chlordiazepoxide-clidinium, Chocolate, Codeine Phosphate, Conjugated Estrogens, Darvon, Dilaudid, Dimetapp Cold-allergy, Dyazide, Erythromycin, Estrogens, Fungizone, Guaifenesin & Derivatives, Ibuprofen, Influenza Vaccines, Iodine, Milk, Nitroglycerin, Other, Penicillins, Petroleum Jelly, Pineapple, Prednisone, Strawberry Extract, Sulfamethoxazole, Tagamet, Talwin, Tape, zithromax           Current Medications (10/21/2015):  This is the current hospital active medication list Current Facility-Administered Medications  Medication Dose Route Frequency Provider Last Rate Last Dose  . 0.9 %  sodium chloride infusion   Intravenous Continuous Nita Sells, MD 50 mL/hr at 10/21/15 1100    . acetaminophen (TYLENOL) tablet  650 mg  650 mg Oral Q6H PRN Mcarthur Rossetti, MD       Or  . acetaminophen (TYLENOL) suppository 650 mg  650 mg Rectal Q6H PRN Mcarthur Rossetti, MD      . HYDROcodone-acetaminophen (NORCO/VICODIN) 5-325 MG per tablet 1-2 tablet  1-2 tablet Oral Q6H PRN Mcarthur Rossetti, MD   2 tablet at 10/21/15 0316  . menthol-cetylpyridinium (CEPACOL) lozenge 3 mg  1 lozenge Oral PRN Mcarthur Rossetti, MD       Or  . phenol (CHLORASEPTIC) mouth spray 1 spray  1 spray Mouth/Throat PRN Mcarthur Rossetti, MD      . methocarbamol (ROBAXIN) tablet 500 mg  500 mg Oral Q6H PRN Rhetta Mura Schorr, NP   500 mg at 10/21/15 0456  . morphine 2 MG/ML injection 0.5 mg  0.5 mg Intravenous Q2H PRN Mcarthur Rossetti, MD   0.5 mg at 10/21/15 1109  . ondansetron (ZOFRAN) tablet 4 mg  4 mg Oral Q6H PRN Mcarthur Rossetti, MD       Or  . ondansetron [facility]) injection 4 mg  4 mg Intravenous Q6H PRN Mcarthur Rossetti, MD      . polyethylene glycol (MIRALAX / GLYCOLAX) packet 17 g  17 g Oral Daily Nita Sells, MD   17 g at 10/21/15 1109  . sennosides (SENOKOT) 8.8 MG/5ML syrup 5 mL  5 mL Oral QHS Nita Sells, MD      . warfarin (COUMADIN) tablet 4 mg  4 mg Oral ONCE-1800 Skeet Simmer, [facility]      . Warfarin - Pharmacist Dosing Inpatient   Does not apply Gonzalez, RPH   0  at 10/21/15 1800     Discharge Medications: Please see discharge summary for a list of discharge medications.  Relevant Imaging Results:  Relevant Lab Results:   Additional Information SSN: [id number]  [name], LCSW

## 2015-10-21 NOTE — Progress Notes (Signed)
Patient ID: [name], female   DOB: Mar 16, 1920, 82 y.o.   MRN: [id number] Tolerated surgery.  Stable this am.  H/H low - acute blood loss anemia from her fracture and surgery.  Dressing clean.  Will need to be only touch down weight bearing on her right leg for now.  Will need SNF placement.

## 2015-10-21 NOTE — Evaluation (Signed)
Physical Therapy Evaluation Patient Details Name: [name] MRN: [id number] DOB: February 27, 1920 Today's Date: 10/21/2015   History of Present Illness  82 yo admitted from independent living with caregiver after fall and righ hip fx s/p ORIF. PMHx: dementia, Afib, HTN, pacemaker, breast CA  Clinical Impression  Pt on arrival able to state her name, caregivers presence, her likelihood to scream and her desire to move slowly. Pt able to tolerate pivoting to EOB and back with occasional calling out with movement but no expression of pain or discomfort in any static position. Pt was in Independent living with 24hr caregiver but requires total assist of 2 at this time for all mobility. Pt with decreased transfers, activity and pain limiting all mobility who will benefit from acute therapy to maximize mobility and function to decrease burden of care.     Follow Up Recommendations SNF;Supervision/Assistance - 24 hour    Equipment Recommendations  None recommended by PT    Recommendations for Other Services       Precautions / Restrictions Precautions Precautions: Fall Restrictions Weight Bearing Restrictions: Yes RLE Weight Bearing: Touchdown weight bearing RLE Partial Weight Bearing Percentage or Pounds:  (TDWB)      Mobility  Bed Mobility Overal bed mobility: Needs Assistance;+2 for physical assistance Bed Mobility: Supine to Sit;Sit to Supine     Supine to sit: Total assist;+2 for physical assistance Sit to supine: Total assist;+2 for physical assistance   General bed mobility comments: pt tolerating slow movement of bil LE to EOB with use of pad and helicopter technique to pivot to EOB. Pt able to sit EOB grossly 4 min with assist for anterior translation secondary to maintained posterior lean. Total assist to pivot back to supine as pt unable to follow commands and maintain balance for attempt at standing  Transfers                    Ambulation/Gait                 Stairs            Wheelchair Mobility    Modified Rankin (Stroke Patients Only)       Balance Overall balance assessment: Needs assistance;History of Falls   Sitting balance-Leahy Scale: Poor   Postural control: Posterior lean Standing balance support: Bilateral upper extremity supported                                 Pertinent Vitals/Pain Pain Assessment:  (PAINAD= 6)    Home Living Family/patient expects to be discharged to:: Private residence Living Arrangements: Non-relatives/Friends Available Help at Discharge: Personal care attendant;Available 24 hours/day Type of Home: Apartment Home Access: Ramped entrance     Home Layout: One level Home Equipment: Walker - 2 wheels;Walker - 4 wheels;Bedside commode;Grab bars - tub/shower;Wheelchair - manual;Adaptive equipment;Hand held shower head;Tub bench      Prior Function Level of Independence: Needs assistance   Gait / Transfers Assistance Needed: pt was walking grossly 15' with RW and min assist as caregiver reports pt would periodically buckle  ADL's / Homemaking Assistance Needed: total assist for bathing, dressing, homemaking by caregiver        Hand Dominance        Extremity/Trunk Assessment   Upper Extremity Assessment: Generalized weakness           Lower Extremity Assessment: Generalized weakness (decreased tolerance for bil LE movement due  to pain)      Cervical / Trunk Assessment: Kyphotic  Communication   Communication: HOH  Cognition Arousal/Alertness: Awake/alert Behavior During Therapy: Flat affect Overall Cognitive Status: History of cognitive impairments - at baseline       Memory: Decreased short-term memory              General Comments      Exercises        Assessment/Plan    PT Assessment Patient needs continued PT services  PT Diagnosis Difficulty walking;Generalized weakness;Acute pain;Altered mental status   PT Problem List  Decreased strength;Decreased range of motion;Decreased activity tolerance;Decreased balance;Decreased mobility;Decreased coordination;Pain;Decreased safety awareness;Decreased knowledge of use of DME;Decreased cognition  PT Treatment Interventions DME instruction;Functional mobility training;Therapeutic activities;Therapeutic exercise;Balance training;Patient/family education   PT Goals (Current goals can be found in the Care Plan section) Acute Rehab PT Goals Patient Stated Goal: return to standing and moving- per caregiver Time For Goal Achievement: 11/04/15 Potential to Achieve Goals: Fair    Frequency Min 3X/week   Barriers to discharge Decreased caregiver support      Co-evaluation               End of Session   Activity Tolerance: Patient tolerated treatment well Patient left: in bed;with call bell/phone within reach;with family/visitor present;with bed alarm set Nurse Communication: Mobility status;Weight bearing status;Precautions         Time: [id number] PT Time Calculation (min) (ACUTE ONLY): 18 min   Charges:   PT Evaluation $PT Eval Moderate Complexity: 1 Procedure     PT G CodesMelford Aase 10/21/2015, 10:35 AM Elwyn Reach, [place]

## 2015-10-21 NOTE — Progress Notes (Signed)
OT Cancellation Note  Patient Details Name: [name] MRN: [id number] DOB: [birth year]   Cancelled Treatment:    Reason Eval/Treat Not Completed:  (OT screened) Pt's current D/C plan is SNF. No apparent immediate acute care OT needs, therefore will defer OT to SNF. If OT eval is needed please call Acute Rehab Dept. at [phone] or text page OT at [phone].     Benito Mccreedy OTR/L [id number]  10/21/2015, 12:39 PM

## 2015-10-21 NOTE — Progress Notes (Signed)
ANTICOAGULATION CONSULT NOTE - Follow Up Consult  Pharmacy Consult for Coumadin Indication: atrial fibrillation and s/p IM nail to hip  Allergies  Allergen Reactions  . Aspirin Shortness Of Breath  . Ceftin [Cefuroxime Axetil] Shortness Of Breath and Itching  . Amiodarone Other (See Comments)    dizziness  . Micardis [Telmisartan] Other (See Comments)    Fatigue and back pain  . Plavix [Clopidogrel Bisulfate] Other (See Comments)    dizziness  . Brompheniramine Other (See Comments)    Unknown reaction  . Benzyl Alcohol Rash    Derivatives also   . Caffeine Rash  . Chlordiazepoxide-Clidinium Rash  . Chocolate Rash  . Codeine Phosphate Rash  . Conjugated Estrogens Rash  . Darvon Rash  . Dilaudid [Hydromorphone Hcl] Rash  . Dimetapp Cold-Allergy Rash  . Dyazide [Hydrochlorothiazide W-Triamterene] Rash  . Erythromycin Rash  . Estrogens Rash  . Fungizone [Amphotericin B] Rash    Unsure if fungi allergy or amphotericin B allergy?  . Guaifenesin & Derivatives Rash  . Ibuprofen Rash  . Influenza Vaccines Swelling  . Iodine Rash  . Milk [Dairy] Diarrhea  . Nitroglycerin Other (See Comments)    Head ache   . Other Other (See Comments)    dog and cat dander, mold, mildew, fungi, dust, causes sneezing and runny nose   . Penicillins Rash    Childhood allergy? No other information available at this time. Pt was given amoxicillin on 09/22/15 with no reaction   . Petroleum Jelly [Petrolatum] Swelling    Lipsticks also (lip swelling)  . Pineapple Rash  . Prednisone Rash  . Strawberry Extract Rash  . Sulfamethoxazole Rash  . Tagamet [Cimetidine] Rash       . Talwin [Pentazocine] Rash  . Tape Rash    Paper tape irritates her skin  . Zithromax [Azithromycin Dihydrate] Rash    Patient Measurements: Height: 5\' 5"  (165.1 cm) Weight: 168 lb (76.204 kg) IBW/kg (Calculated) : 57  Vital Signs: Temp: 98.1 F (36.7 C) (02/07 1415) Temp Source: Oral (02/07 1415) BP: 92/54 mmHg  (02/07 1415) Pulse Rate: 89 (02/07 1415)  Labs:  Recent Labs  10/19/15 1406 10/19/15 1832 10/19/15 2200 10/20/15 0519 10/21/15 0228  HGB 12.8  --   --  10.9* 8.7*  HCT 39.7  --   --  33.6* 28.1*  PLT 148*  --   --  135* 124*  LABPROT 30.3*  --   --  17.3* 16.0*  INR 2.96*  --   --  1.40 1.26  CREATININE 1.43*  --   --  1.52* 1.93*  TROPONINI  --  0.03 <0.03 <0.03  --     Estimated Creatinine Clearance: 18.6 mL/min (by C-G formula based on Cr of 1.93).  Assessment:   82 yr old female on Coumadin prior to admission for afib.  INR 2.96 on admit, reversed with Vitamin K 10 mg IV on 2/5 pm.  Coumadin resumed with 4 mg x 1 on 2/6 pm and INR is down further to 1.26 today.  Post-op anemia, dressing noted clean. On daily iron as prior to admission.    Home Coumadin regimen:  2 mg alternating with 3 mg.  Goal of Therapy:  INR 2-3 Monitor platelets by anticoagulation protocol: Yes   Plan:   Coumadin 4 mg x 1 again today.  Daily PT/INR.  Arty Baumgartner, [place] Pager: [phone] 10/21/2015,2:46 PM

## 2015-10-21 NOTE — Op Note (Signed)
[name], [name] NO.:  [id number]  MEDICAL RECORD NO.:  [id number]  LOCATION:  5N09C                        FACILITY:  [facility]  PHYSICIAN:  Lind Guest. Ninfa Linden, M.D.DATE OF BIRTH:  [birth year]  DATE OF PROCEDURE:  10/20/2015 DATE OF DISCHARGE:                              OPERATIVE REPORT   PREOPERATIVE DIAGNOSIS:  Right comminuted intertrochanteric femur/hip fracture.  POSTOPERATIVE DIAGNOSIS:  Right comminuted intertrochanteric femur/hip fracture.  PROCEDURE:  Open reduction and internal fixation of right intertrochanteric hip fracture using intramedullary rod and hip screw.  IMPLANTS:  [employer]am[employer] INTERTAN nail with size 10 x 34 with a 90/85 lag screw, compression screw.  SURGEON:  Lind Guest. Ninfa Linden, MD  ASSISTANT:  Erskine Emery, PA-C  ANESTHESIA:  General.  ANTIBIOTICS:  2 g IV Ancef.  BLOOD LOSS:  Less than 200 mL.  COMPLICATIONS:  None.  INDICATIONS:  Ms. Fialkowski is a 82 year old elderly female with dementia who does live independent living, but has a caregiver.  She had some type of mechanical fall over the weekend and was found to have a comminuted intertrochanteric hip fracture.  She was found to have a severe amount of pain with this hip.  The family understands the risks and benefits of these fractures and do collectively wishes to proceed with surgery given the pain that she is having, they understand this is purely quality of life issue.  PROCEDURE DESCRIPTION:  After informed consent was obtained, appropriate right hip was marked.  She was brought to the operating room.  General anesthesia was obtained while she was on her stretcher.  She was then placed supine on the fracture table.  The perineal post in place.  Her left nonoperative hip flexed and abducted out of the field with an abduction stirrup with appropriate padding in the popliteal area and then the right operative leg in inline skeletal traction with  traction applied and rotation as well, her fracture has reduced as possible preoperatively.  Before, we prepped and draped, we did reduce the fracture.  We then prepped the right leg with DuraPrep and sterile drapes.  Time-out was called and she was identified as correct patient, correct right hip.  We then made incision proximal to the greater trochanter and dissected down the greater trochanter.  Under direct fluoroscopy, we then placed temporary guide pin and used initiating reamer up in the femoral canal.  We then placed our rod 10 x 34 down the femoral canal that we had already pre-chosen preoperatively using fluoroscopy with the rod sterile box.  Once we passed it down the canal, we used outrigger guide to make a separate thigh incision on the lateral aspect of the thigh and we were able to dissect down and then placed temporary guide pin through the nail and into the femoral head in the center-center position.  We took measurement of all of this and then drilled for the depth of 90 and 85 compression screw.  We did place the compression component and locked the screw with the rod from the top. We then removed this all the outrigger guide and removed all instrumentation.  We put the hip through internal and external rotation, looking fluoroscopy from AP lateral views  to make sure that we were pleased with our implant positioning.  We then irrigated 2 small wounds with normal saline solution closing the deep tissue with #1 Vicryl followed by 0 Vicryl, 2-0 Vicryl, and then interrupted staples on the skin.  Well-padded sterile dressing was applied.  She was taken off the fracture table, awakened, extubated, and taken to recovery room in fair condition.  Of note, Erskine Emery, PA-C assisted in the entire case. His assistance was crucial for facilitating all aspects of this case.     Lind Guest. Ninfa Linden, M.D.     CYB/MEDQ  D:  10/20/2015  T:  10/21/2015  Job:  [id number]

## 2015-10-21 NOTE — Clinical Social Work Note (Signed)
Patient is alert and oriented to person.  CSW attempted to contact [name] (contact in chart) to complete assessment.  CSW unable to reach- left message.  PT is recommending SNF at this time.  CSW will continue to follow and assist with placement.  Nonnie Done, LCSW [phone]  5N1-9; 2S 15-16 and [place] Licensed Clinical Social Worker

## 2015-10-21 NOTE — Progress Notes (Signed)
[name] [id number] DOB: April 06, 1920 DOA: 10/19/2015 PCP: Abigail Miyamoto, MD  Brief narrative:  82 year old female  atrial fibrillation+ tachybradycardia syndrome CHad2Vasc2 score=on Coumadin status post pacemaker 2004 initially, replacement 2014 Welcome 11/2011 showed no evidence for ischemia and EF 66% Hypothyroidism Anemia Chronic kidney disease Hyperglycemia breast cancer status post lumpectomy 06/2006 Lumbar spinal stenosis TIAs  Remote history of DVT  Presented to Hospital from independent living facility 10/18/14 with history of fall and hip fracture Moderate risk about 3.6% for perioperative complications cleared for surgery2/5/70   Past medical history-As per Problem list Chart reviewed as below- ortho  Consultants:    Procedures:    Antibiotics:     Subjective   Fair Easily agitated No overt pain No n/v Cannot get ROS 2/2 to dementia Caregivers at the bedside   Objective    Interim History:   Telemetry:    Objective: Filed Vitals:   10/20/15 1838 10/20/15 2118 10/21/15 0147 10/21/15 0706  BP: 94/64 91/42 115/55 102/61  Pulse: 115 108 67 110  Temp: 98.8 F (37.1 C) 98.9 F (37.2 C) 98.2 F (36.8 C) 98 F (36.7 C)  TempSrc:  Oral Oral Oral  Resp:  22 20 20   Height:      Weight:      SpO2: 97% 95% 95% 98%    Intake/Output Summary (Last 24 hours) at 10/21/15 1216 Last data filed at 10/21/15 0520  Gross per 24 hour  Intake    850 ml  Output    165 ml  Net    685 ml    Exam:  General: eomi ncat no jvd Cardiovascular: s1 s 2no m/r/g Respiratory: clear Abdomen:  Soft nt nd no rebound Skin no le edema Neuro intact  Data Reviewed: Basic Metabolic Panel:  Recent Labs Lab 10/19/15 1406 10/20/15 0519 10/21/15 0228  NA 141 143 141  K 4.1 4.6 4.6  CL 101 103 102  CO2 28 30 29   GLUCOSE 154* 147* 136*  BUN 28* 32* 39*  CREATININE 1.43* 1.52* 1.93*  CALCIUM 8.8* 8.7* 8.3*   Liver Function Tests: No  results for input(s): AST, ALT, ALKPHOS, BILITOT, PROT, ALBUMIN in the last 168 hours. No results for input(s): LIPASE, AMYLASE in the last 168 hours. No results for input(s): AMMONIA in the last 168 hours. CBC:  Recent Labs Lab 10/19/15 1406 10/20/15 0519 10/21/15 0228  WBC 8.0 7.0 7.9  HGB 12.8 10.9* 8.7*  HCT 39.7 33.6* 28.1*  MCV 97.5 97.4 98.6  PLT 148* 135* 124*   Cardiac Enzymes:  Recent Labs Lab 10/19/15 1832 10/19/15 2200 10/20/15 0519  TROPONINI 0.03 <0.03 <0.03   BNP: Invalid input(s): POCBNP CBG:  Recent Labs Lab 10/20/15 1550 10/20/15 1738  GLUCAP 108* 129*    Recent Results (from the past 240 hour(s))  Surgical pcr screen     Status: None   Collection Time: 10/20/15  2:44 PM  Result Value Ref Range Status   MRSA, PCR NEGATIVE NEGATIVE Final   Staphylococcus aureus NEGATIVE NEGATIVE Final    Comment:        The Xpert SA Assay (FDA approved for NASAL specimens in patients over 60 years of age), is one component of a comprehensive surveillance program.  Test performance has been validated by [facility] for patients greater than or equal to 9 year old. It is not intended to diagnose infection nor to guide or monitor treatment.      Studies:  All Imaging reviewed and is as per above notation   Scheduled Meds: . polyethylene glycol  17 g Oral Daily  . sennosides  5 mL Oral QHS  . Warfarin - Pharmacist Dosing Inpatient   Does not apply q1800   Continuous Infusions: . sodium chloride       Assessment/Plan:   HIp #-for surgery   per discretion Ortho services-appreciate their input into  Anticoagulation post-op-resumed coumadin 2/6 pm Weight bearing tolerance for therapy services-touch down WB only Wound care Pain management Follow-up services required 1. Afib CHad2Vasc2 score=5 or 6 withTachybrady syndrome s/p popm-Continue Cardizem 180 CD. Marland Kitchen  Resume AC as per Ortho 2. Prior TIA-monitor-likely 2/2 to #1.  No role dual  Rx with anti-platelet.  INR 1.26 3. Hypothyroid-cotninue Synthroid 37.5 mcg daily. 4. AKI-IVF to continue-has worsened to continue 50 cc/hr overnight.  ABLA could casue poor renal perfusion.  Will recheck in am 5. Dementia-mod-severe-will continue Sertraline 25 bid, Trazadone 25 qhs and re-orient as possible 6. Anemia of acute blood loss expected superimposed on Iron def anemia-hemoglobin reasonably stable.  follow hemoglobin in the am    D/w caregiver bedside Inpatient  Probably will need SNF on d/c-await therapy eval    Verneita Griffes, MD  [facility] Pager [phone] 10/21/2015, 12:16 PM    LOS: 2 days

## 2015-10-21 NOTE — Care Management (Signed)
Utilization review completed. Imran Nuon, RN Case Manager [phone]. 

## 2015-10-22 DIAGNOSIS — I10 Essential (primary) hypertension: Secondary | ICD-10-CM

## 2015-10-22 LAB — CBC
HEMATOCRIT: 25 % — AB (ref 36.0–46.0)
HEMOGLOBIN: 7.8 g/dL — AB (ref 12.0–15.0)
MCH: 31 pg (ref 26.0–34.0)
MCHC: 31.2 g/dL (ref 30.0–36.0)
MCV: 99.2 fL (ref 78.0–100.0)
PLATELETS: 113 10*3/uL — AB (ref 150–400)
RBC: 2.52 MIL/uL — AB (ref 3.87–5.11)
RDW: 13.8 % (ref 11.5–15.5)
WBC: 6.8 10*3/uL (ref 4.0–10.5)

## 2015-10-22 LAB — BASIC METABOLIC PANEL
ANION GAP: 7 (ref 5–15)
BUN: 34 mg/dL — AB (ref 6–20)
CHLORIDE: 106 mmol/L (ref 101–111)
CO2: 29 mmol/L (ref 22–32)
Calcium: 8 mg/dL — ABNORMAL LOW (ref 8.9–10.3)
Creatinine, Ser: 1.43 mg/dL — ABNORMAL HIGH (ref 0.44–1.00)
GFR calc Af Amer: 35 mL/min — ABNORMAL LOW (ref >60)
GFR, EST NON AFRICAN AMERICAN: 31 mL/min — AB (ref >60)
GLUCOSE: 127 mg/dL — AB (ref 65–99)
POTASSIUM: 4.1 mmol/L (ref 3.5–5.1)
Sodium: 142 mmol/L (ref 135–145)

## 2015-10-22 LAB — PROTIME-INR
INR: 1.22 (ref 0.00–1.49)
Prothrombin Time: 15.6 seconds — ABNORMAL HIGH (ref 11.6–15.2)

## 2015-10-22 MED ORDER — OXYCODONE HCL 10 MG PO TABS: 5.0000 mg | ORAL_TABLET | ORAL | Status: AC | PRN

## 2015-10-22 MED ORDER — SENNOSIDES 8.8 MG/5ML PO SYRP: 5.0000 mL | ORAL_SOLUTION | Freq: Every day | ORAL | Status: AC

## 2015-10-22 MED ORDER — WARFARIN SODIUM 4 MG PO TABS
4.0000 mg | ORAL_TABLET | Freq: Once | ORAL | Status: DC
  Filled 2015-10-22: qty 1

## 2015-10-22 NOTE — Care Management Important Message (Signed)
Important Message  Patient Details  Name: [name] MRN: [id number] Date of Birth: [birth year]   Medicare Important Message Given:  Yes    [name] 10/22/2015, 1:44 PMImportant Message  Patient Details  Name: [name] MRN: [id number] Date of Birth: [birth year]   Medicare Important Message Given:  Yes    [name] G 10/22/2015, 1:44 PM

## 2015-10-22 NOTE — Progress Notes (Signed)
Patient ID: [name], female   DOB: [birth year], 82 y.o.   MRN: [id number] Right hip stable.  Dressings clean.  Can discharge to SNF from Ortho standpoint if medically stable.

## 2015-10-22 NOTE — Progress Notes (Signed)
Report given to [facility], all questions were answered. Unit number provided for further questions.

## 2015-10-22 NOTE — Clinical Social Work Note (Signed)
Clinical Social Work Assessment  Patient Details  Name: [name] MRN: [id number] Date of Birth: 19-Jul-1920  Date of referral:  10/21/15               Reason for consult:  Facility Placement                Permission sought to share information with:    Permission granted to share information::  No (patient alert and only oriented to self.  patient has POA)  Name::      [name]-financial POA; [name]- HCPOA; [name]- IT sales professional)  Agency::   Chief of Staff )  Relationship::     Contact Information:     Housing/Transportation Living arrangements for the past 2 months:  [place] of Information:  Power of [place], Other (Comment Required) Wellsite geologist) Patient Interpreter Needed:  None Criminal Activity/Legal Involvement Pertinent to Current Situation/Hospitalization:  No - Comment as needed Significant Relationships:   (POA, IT sales professional) Lives with:  Facility Resident Do you feel safe going back to the place where you live?  No Need for family participation in patient care:  Yes (Comment) (patient oriented to self only)  Care giving concerns:  No caregiving concerns at this time.   Social Worker assessment / plan:  CSW received consult for SNF placement.  Patient is from [facility] and will be discharged to [facility] at time of discharge.  Patient has no family of record, but has financial POA- [name] [phone] and [name] [phone].  CSW spoke with [name] to review disposition plans.  [name] is aware and agreeable to these plans at time of discharge.  [name] states that patient will need PTAR transportation.  CSW contacted [facility] and spoke with [name] regarding possible admission today.  [name] will be at bedside today to complete paperwork with patient's caregiver who is currently at bedside.  Of note, orthopedics have signed off of patient stating she may dc to SNF if medical  clears her.  CSW will contact MD to inform that SNF bed is available today if patient is to dc.    Employment status:  Retired Forensic scientist:  [employer] PT Recommendations:  [place] / Referral to community resources:  [place]  Patient/Family's Response to care:  Patient response unable to assess; Caregivers and POAs are aware and agreeable to SNF placement   Patient/Family's Understanding of and Emotional Response to Diagnosis, Current Treatment, and Prognosis: All parties are aware and realistic regarding patient's level of care needed at time of discharge.  Emotional Assessment Appearance:  Appears older than stated age Attitude/Demeanor/Rapport:   (appropriate) Affect (typically observed):  Accepting Orientation:  Oriented to Self Alcohol / Substance use:  Not Applicable Psych involvement (Current and /or in the community):  No (Comment)  Discharge Needs  Concerns to be addressed:  No discharge needs identified Readmission within the last 30 days:  No Current discharge risk:  None Barriers to Discharge:  No Barriers Identified   [name], LCSW 10/22/2015, 9:58 AM

## 2015-10-22 NOTE — Discharge Summary (Signed)
Physician Discharge Summary  [name] [id number] DOB: [birth year] DOA: 10/19/2015  PCP: Abigail Miyamoto, MD  Admit date: 10/19/2015 Discharge date: 10/22/2015  Time spent: 45 minutes  Recommendations for Outpatient Follow-up:  1. Patient will need placement at skilled nursing facility on discharge 2. Would recommend complete blood count as well as basic metabolic panel as an outpatient in about one week 3. Repeat INR in about 4-5 days-skilled nursing facility physician to determine if good long going ongoing candidate as had a fall on Coumadin with hip fracture-I've continued for the time being 4. Please keep on good bowel regimen senna and MiraLAX  Discharge Diagnoses:  Principal Problem:   Hip fracture ([place]) Active Problems:   Chronic venous insufficiency   Atrial fibrillation (HCC)   Pacemaker-Mdt dual   Diastolic HF (heart failure) (HCC)   Tachy-brady syndrome ([place])   Hypertension   Hypothyroidism   Anemia, iron deficiency   Renal failure (ARF), acute on chronic ([place])   Fall   Discharge Condition: Stable  Diet recommendation: Heart healthy low-salt  Filed Weights   10/19/15 1834  Weight: 76.204 kg (168 lb)    History of present illness:  82 year old female atrial fibrillation+ tachybradycardia syndrome CHad2Vasc2 score=on Coumadin status post pacemaker 2004 initially, replacement 2014 [place] 11/2011 showed no evidence for ischemia and EF 66% Hypothyroidism Anemia Chronic kidney disease Hyperglycemia breast cancer status post lumpectomy 06/2006 Lumbar spinal stenosis TIAs  Remote history of DVT  Presented to Hospital from independent living facility 10/18/14 with history of fall and hip fracture Moderate risk about 3.6% for perioperative complications cleared for surgery2/5/70  Hospital Course:  HIp #-for surgery  per discretion Ortho services-appreciate their input into  Anticoagulation post-op-resumed coumadin 2/6 pm Weight bearing  tolerance for therapy services-touch down WB only Pain management-have increased her oxycodone from half to 1 tablet daily Follow-up services required- will have follow-up as per AVS instructions with Dr. Erlinda Hong 1. Afib CHad2Vasc2 score=5 or 6 withTachybrady syndrome s/p ppm -Continue Cardizem 180 CD.  2. Prior TIA-monitor-likely 2/2 to #1. No role dual Rx with anti-platelet. INR 1.26 3. Hypothyroid-cotninue Synthroid 37.5 mcg daily. 4. AKI-IVF to continue-has worsened to continue 50 cc/hr overnight.Patient did have some resolution of kidney function should have repeat labs as an outpatient.  Repeat blood work in about one week  5. Dementia-mod-severe-will continue Sertraline 25 bid, Trazadone 25 qhs and re-orient as possible 6. Anemia of acute blood loss expected superimposed on Iron def anemia-hemoglobin reasonably stable. follow hemoglobin in the am-hemoglobin was stable on day of discharge report 7 7. Constipation-probably secondary to pain as well as pain medications. Have increased her regimen to MiraLAX daily 17 g in addition to senna  Procedures: Hip fracture status post repair 10/21/15  Consultations: Orthopedics  Discharge Exam: Filed Vitals:   10/21/15 1953 10/22/15 0548  BP: 102/45 116/51  Pulse: 102 99  Temp: 98.3 F (36.8 C) 98.3 F (36.8 C)  Resp: 18 18    General: EOMI NCAT  Cardiovascular: S1-S2 no murmur rub or gallop  Respiratory: Clinically clear no added sound  Discharge Instructions   Discharge Instructions    Diet - low sodium heart healthy    Complete by:  As directed      Increase activity slowly    Complete by:  As directed      Touch down weight bearing    Complete by:  As directed           Current Discharge Medication List  START taking these medications   Details  sennosides (SENOKOT) 8.8 MG/5ML syrup Take 5 mLs by mouth at bedtime. Qty: 240 mL, Refills: 0      CONTINUE these medications which have CHANGED   Details  Oxycodone HCl 10  MG TABS Take 0.5-1 tablets (5-10 mg total) by mouth every 4 (four) hours as needed (pain). Qty: 2 tablet, Refills: 0      CONTINUE these medications which have NOT CHANGED   Details  albuterol (PROAIR HFA) 108 (90 BASE) MCG/ACT inhaler Inhale 1 puff into the lungs every hour as needed for wheezing or shortness of breath.    albuterol (PROVENTIL) (2.5 MG/3ML) 0.083% nebulizer solution Take 3 mLs (2.5 mg total) by nebulization every 2 (two) hours as needed for wheezing. Qty: 75 mL, Refills: 12    Cholecalciferol (VITAMIN D3) 5000 units TABS Take 5,000 Units by mouth daily.    diclofenac sodium (VOLTAREN) 1 % GEL Apply 1 application topically 4 (four) times daily as needed (pain).    diltiazem (CARDIZEM CD) 180 MG 24 hr capsule Take 180 mg by mouth daily before breakfast. Take on an empty stomach    famotidine-calcium carbonate-magnesium hydroxide (PEPCID COMPLETE) 10-800-165 MG CHEW chewable tablet Chew 1 tablet by mouth 2 (two) times daily as needed (hearburn/ acid reflux).    ferrous sulfate 325 (65 FE) MG tablet Take 1 tablet (325 mg total) by mouth daily with breakfast. Qty: 30 tablet, Refills: 3    levothyroxine (SYNTHROID, LEVOTHROID) 75 MCG tablet Take 37.5 mcg by mouth daily before breakfast. Take 1 hour after diltiazem    loratadine (CLARITIN) 10 MG tablet Take 10 mg by mouth daily.    Methylfol-Methylcob-Acetylcyst (METAFOLBIC PLUS PO) Take 1 tablet by mouth daily with supper.     Multiple Vitamins-Minerals (ICAPS AREDS FORMULA PO) Take 1 capsule by mouth daily with supper.     omeprazole (PRILOSEC) 20 MG capsule Take 20 mg by mouth 2 (two) times daily before a meal.     OXYGEN Inhale into the lungs as needed (shortness of breath). 2 L    Polyethyl Glycol-Propyl Glycol (SYSTANE OP) Place 1 drop into both eyes 2 (two) times daily.    polyethylene glycol (MIRALAX / GLYCOLAX) packet Take 17 g by mouth daily. Mix in 8 oz of liquid and drink    sertraline (ZOLOFT) 25 MG  tablet Take 12.5 mg by mouth 2 (two) times daily.     traZODone (DESYREL) 50 MG tablet Take 25 mg by mouth at bedtime.    !! warfarin (COUMADIN) 2 MG tablet Take 2 mg by mouth See admin instructions. Take 1 tablet (2 mg) by mouth every other day at 4pm (alternate with 3 mg tablets)    !! warfarin (COUMADIN) 3 MG tablet Take 3 mg by mouth See admin instructions. Take 1 tablet (3 mg) by mouth every other day at 4pm     !! - Potential duplicate medications found. Please discuss with provider.    STOP taking these medications     furosemide (LASIX) 40 MG tablet      OVER THE COUNTER MEDICATION      trandolapril (MAVIK) 1 MG tablet      potassium chloride SA (K-DUR,KLOR-CON) 20 MEQ tablet        Allergies  Allergen Reactions  . Aspirin Shortness Of Breath  . Ceftin [Cefuroxime Axetil] Shortness Of Breath and Itching  . Amiodarone Other (See Comments)    dizziness  . Micardis [Telmisartan] Other (See Comments)  Fatigue and back pain  . Plavix [Clopidogrel Bisulfate] Other (See Comments)    dizziness  . Brompheniramine Other (See Comments)    Unknown reaction  . Benzyl Alcohol Rash    Derivatives also   . Caffeine Rash  . Chlordiazepoxide-Clidinium Rash  . Chocolate Rash  . Codeine Phosphate Rash  . Conjugated Estrogens Rash  . Darvon Rash  . Dilaudid [Hydromorphone Hcl] Rash  . Dimetapp Cold-Allergy Rash  . Dyazide [Hydrochlorothiazide W-Triamterene] Rash  . Erythromycin Rash  . Estrogens Rash  . Fungizone [Amphotericin B] Rash    Unsure if fungi allergy or amphotericin B allergy?  . Guaifenesin & Derivatives Rash  . Ibuprofen Rash  . Influenza Vaccines Swelling  . Iodine Rash  . Milk [Dairy] Diarrhea  . Nitroglycerin Other (See Comments)    Head ache   . Other Other (See Comments)    dog and cat dander, mold, mildew, fungi, dust, causes sneezing and runny nose   . Penicillins Rash    Childhood allergy? No other information available at this time. Pt was given  amoxicillin on 09/22/15 with no reaction   . Petroleum Jelly [Petrolatum] Swelling    Lipsticks also (lip swelling)  . Pineapple Rash  . Prednisone Rash  . Strawberry Extract Rash  . Sulfamethoxazole Rash  . Tagamet [Cimetidine] Rash       . Talwin [Pentazocine] Rash  . Tape Rash    Paper tape irritates her skin  . Zithromax [Azithromycin Dihydrate] Rash   Follow-up Information    Follow up with Mcarthur Rossetti, MD. Schedule an appointment as soon as possible for a visit in 2 weeks.   Specialty:  Orthopedic Surgery   Contact information:   [place] [address] [phone]       Follow up with [facility] .   Specialty:  [place] information:   [address] [place] [phone]       The results of significant diagnostics from this hospitalization (including imaging, microbiology, ancillary and laboratory) are listed below for reference.    Significant Diagnostic Studies: Ct Head Wo Contrast  10/19/2015  CLINICAL DATA:  Fall.  Head trauma.  Anticoagulation. EXAM: CT HEAD WITHOUT CONTRAST CT CERVICAL SPINE WITHOUT CONTRAST TECHNIQUE: Multidetector CT imaging of the head and cervical spine was performed following the standard protocol without intravenous contrast. Multiplanar CT image reconstructions of the cervical spine were also generated. COMPARISON:  CT head 06/19/2014 FINDINGS: CT HEAD FINDINGS Advanced atrophy with extensive chronic microvascular ischemic change in the white matter. Negative for acute infarct.  Negative for hemorrhage or mass No skull fracture.  Bilateral mastoidectomy defects. CT CERVICAL SPINE FINDINGS Mild anterior slip C4-5 and C5-6 related to disc and facet degeneration. Mild disc and facet degeneration at L3-4. Negative for fracture or mass in the cervical spine. Apical emphysema. IMPRESSION: Advanced atrophy and chronic microvascular ischemia. No acute  intracranial abnormality. Negative for cervical spine fracture Electronically Signed   By: Franchot Gallo M.D.   On: 10/19/2015 14:52   Ct Cervical Spine Wo Contrast  10/19/2015  CLINICAL DATA:  Fall.  Head trauma.  Anticoagulation. EXAM: CT HEAD WITHOUT CONTRAST CT CERVICAL SPINE WITHOUT CONTRAST TECHNIQUE: Multidetector CT imaging of the head and cervical spine was performed following the standard protocol without intravenous contrast. Multiplanar CT image reconstructions of the cervical spine were also generated. COMPARISON:  CT head 06/19/2014 FINDINGS: CT HEAD FINDINGS Advanced atrophy with extensive chronic  microvascular ischemic change in the white matter. Negative for acute infarct.  Negative for hemorrhage or mass No skull fracture.  Bilateral mastoidectomy defects. CT CERVICAL SPINE FINDINGS Mild anterior slip C4-5 and C5-6 related to disc and facet degeneration. Mild disc and facet degeneration at L3-4. Negative for fracture or mass in the cervical spine. Apical emphysema. IMPRESSION: Advanced atrophy and chronic microvascular ischemia. No acute intracranial abnormality. Negative for cervical spine fracture Electronically Signed   By: Franchot Gallo M.D.   On: 10/19/2015 14:52   Chest Portable 1 View  10/19/2015  CLINICAL DATA:  Hip fracture EXAM: PORTABLE CHEST 1 VIEW COMPARISON:  03/30/2015 FINDINGS: Linear scarring/ atelectasis in the right lower lobe. Mild patchy left lower lobe opacity, likely atelectasis. Eventration of the right hemidiaphragm, chronic. No pleural effusion or pneumothorax. Cardiomegaly.  Left subclavian pacemaker. IMPRESSION: No evidence of acute cardiopulmonary disease. Electronically Signed   By: Julian Hy M.D.   On: 10/19/2015 19:13   Dg Hip Operative Unilat W Or W/o Pelvis Right  10/20/2015  CLINICAL DATA:  Followup of fracture. EXAM: OPERATIVE RIGHT HIP (WITH PELVIS IF PERFORMED) 6 VIEWS TECHNIQUE: Fluoroscopic spot image(s) were submitted for interpretation  post-operatively. COMPARISON:  Plain films of 10/19/2015 FINDINGS: 6 intraoperative images demonstrate placement of a a dynamic hip screw across the previously described proximal right femur fracture. No acute hardware complication. IMPRESSION: Intraoperative imaging of right proximal femoral fixation. Electronically Signed   By: Abigail Miyamoto M.D.   On: 10/20/2015 18:22   Dg Hip Unilat With Pelvis 2-3 Views Right  10/19/2015  CLINICAL DATA:  Fall.  Hip pain EXAM: DG HIP (WITH OR WITHOUT PELVIS) 2-3V RIGHT COMPARISON:  None. FINDINGS: Intertrochanteric fracture right femur with angulation and mild displacement. Hip joint is normal. No other pelvic fracture identified. IMPRESSION: Angulated intertrochanteric femur fracture on the right. Electronically Signed   By: Franchot Gallo M.D.   On: 10/19/2015 14:54    Microbiology: Recent Results (from the past 240 hour(s))  Surgical pcr screen     Status: None   Collection Time: 10/20/15  2:44 PM  Result Value Ref Range Status   MRSA, PCR NEGATIVE NEGATIVE Final   Staphylococcus aureus NEGATIVE NEGATIVE Final    Comment:        The Xpert SA Assay (FDA approved for NASAL specimens in patients over 82 years of age), is one component of a comprehensive surveillance program.  Test performance has been validated by [facility] for patients greater than or equal to 42 year old. It is not intended to diagnose infection nor to guide or monitor treatment.      Labs: Basic Metabolic Panel:  Recent Labs Lab 10/19/15 1406 10/20/15 0519 10/21/15 0228 10/22/15 0647  NA 141 143 141 142  K 4.1 4.6 4.6 4.1  CL 101 103 102 106  CO2 28 30 29 29   GLUCOSE 154* 147* 136* 127*  BUN 28* 32* 39* 34*  CREATININE 1.43* 1.52* 1.93* 1.43*  CALCIUM 8.8* 8.7* 8.3* 8.0*   Liver Function Tests: No results for input(s): AST, ALT, ALKPHOS, BILITOT, PROT, ALBUMIN in the last 168 hours. No results for input(s): LIPASE, AMYLASE in the last 168 hours. No results for  input(s): AMMONIA in the last 168 hours. CBC:  Recent Labs Lab 10/19/15 1406 10/20/15 0519 10/21/15 0228 10/22/15 0647  WBC 8.0 7.0 7.9 6.8  HGB 12.8 10.9* 8.7* 7.8*  HCT 39.7 33.6* 28.1* 25.0*  MCV 97.5 97.4 98.6 99.2  PLT 148* 135* 124* 113*  Cardiac Enzymes:  Recent Labs Lab 10/19/15 1832 10/19/15 2200 10/20/15 0519  TROPONINI 0.03 <0.03 <0.03   BNP: BNP (last 3 results)  Recent Labs  12/17/14 0656 12/18/14 1250  BNP 328.8* 691.4*    ProBNP (last 3 results) No results for input(s): PROBNP in the last 8760 hours.  CBG:  Recent Labs Lab 10/20/15 1550 10/20/15 1738  GLUCAP 108* 129*       Signed:  Nita Sells MD   [facility] 10/22/2015, 10:48 AM

## 2015-10-22 NOTE — Progress Notes (Signed)
ANTICOAGULATION CONSULT NOTE - Follow Up Consult  Pharmacy Consult:  Coumadin Indication: atrial fibrillation and s/p IM nail to hip  Allergies  Allergen Reactions  . Aspirin Shortness Of Breath  . Ceftin [Cefuroxime Axetil] Shortness Of Breath and Itching  . Amiodarone Other (See Comments)    dizziness  . Micardis [Telmisartan] Other (See Comments)    Fatigue and back pain  . Plavix [Clopidogrel Bisulfate] Other (See Comments)    dizziness  . Brompheniramine Other (See Comments)    Unknown reaction  . Benzyl Alcohol Rash    Derivatives also   . Caffeine Rash  . Chlordiazepoxide-Clidinium Rash  . Chocolate Rash  . Codeine Phosphate Rash  . Conjugated Estrogens Rash  . Darvon Rash  . Dilaudid [Hydromorphone Hcl] Rash  . Dimetapp Cold-Allergy Rash  . Dyazide [Hydrochlorothiazide W-Triamterene] Rash  . Erythromycin Rash  . Estrogens Rash  . Fungizone [Amphotericin B] Rash    Unsure if fungi allergy or amphotericin B allergy?  . Guaifenesin & Derivatives Rash  . Ibuprofen Rash  . Influenza Vaccines Swelling  . Iodine Rash  . Milk [Dairy] Diarrhea  . Nitroglycerin Other (See Comments)    Head ache   . Other Other (See Comments)    dog and cat dander, mold, mildew, fungi, dust, causes sneezing and runny nose   . Penicillins Rash    Childhood allergy? No other information available at this time. Pt was given amoxicillin on 09/22/15 with no reaction   . Petroleum Jelly [Petrolatum] Swelling    Lipsticks also (lip swelling)  . Pineapple Rash  . Prednisone Rash  . Strawberry Extract Rash  . Sulfamethoxazole Rash  . Tagamet [Cimetidine] Rash       . Talwin [Pentazocine] Rash  . Tape Rash    Paper tape irritates her skin  . Zithromax [Azithromycin Dihydrate] Rash    Patient Measurements: Height: 5\' 5"  (165.1 cm) Weight: 168 lb (76.204 kg) IBW/kg (Calculated) : 57  Vital Signs: Temp: 98.3 F (36.8 C) (02/08 0548) Temp Source: Oral (02/08 0548) BP: 116/51 mmHg  (02/08 0548) Pulse Rate: 99 (02/08 0548)  Labs:  Recent Labs  10/19/15 1832 10/19/15 2200 10/20/15 0519 10/21/15 0228 10/22/15 0647  HGB  --   --  10.9* 8.7* 7.8*  HCT  --   --  33.6* 28.1* 25.0*  PLT  --   --  135* 124* 113*  LABPROT  --   --  17.3* 16.0* 15.6*  INR  --   --  1.40 1.26 1.22  CREATININE  --   --  1.52* 1.Dawn* 1.43*  TROPONINI 0.03 <0.03 <0.03  --   --     Estimated Creatinine Clearance: 25.1 mL/min (by C-G formula based on Cr of 1.43).    Assessment: [name] on Coumadin PTA for history of Afib.  S/p IM nail to hip and Coumadin resumed on 10/20/15, also for VTE prophylaxis.  She received Vitamin K 10mg  on 10/19/15, so expect a slow rise in INR.  INR remains sub-therapeutic.  No bleeding reported.  Home dose: 2 mg alternating with 3 mg    Goal of Therapy:  INR 2-3    Plan:  - Coumadin 4mg  PO today - Daily PT / INR - F/U resume home meds   Cissy Galbreath D. Mina Marble, PharmD, BCPS Pager:  [phone] 10/22/2015, 10:30 AM

## 2015-10-22 NOTE — Discharge Instructions (Signed)
Dry dressing daily to right hip incisions as needed. Can get incisions wet. Only tough down weigh bearing on right hip   Information on my medicine - Coumadin   (Warfarin)  This medication education was reviewed with me or my healthcare representative as part of my discharge preparation.  The pharmacist that spoke with me during my hospital stay was:  Saundra Shelling, [facility]  Why was Coumadin prescribed for you? Coumadin was prescribed for you because you have a blood clot or a medical condition that can cause an increased risk of forming blood clots. Blood clots can cause serious health problems by blocking the flow of blood to the heart, lung, or brain. Coumadin can prevent harmful blood clots from forming. As a reminder your indication for Coumadin is:   Stroke Prevention Because Of Atrial Fibrillation  What test will check on my response to Coumadin? While on Coumadin (warfarin) you will need to have an INR test regularly to ensure that your dose is keeping you in the desired range. The INR (international normalized ratio) number is calculated from the result of the laboratory test called prothrombin time (PT).  If an INR APPOINTMENT HAS NOT ALREADY BEEN MADE FOR YOU please schedule an appointment to have this lab work done by your health care provider within 7 days. Your INR goal is usually a number between:  2 to 3 or your provider may give you a more narrow range like 2-2.5.  Ask your health care provider during an office visit what your goal INR is.  What  do you need to  know  About  COUMADIN? Take Coumadin (warfarin) exactly as prescribed by your healthcare provider about the same time each day.  DO NOT stop taking without talking to the doctor who prescribed the medication.  Stopping without other blood clot prevention medication to take the place of Coumadin may increase your risk of developing a new clot or stroke.  Get refills before you run out.  What do you do if you miss a  dose? If you miss a dose, take it as soon as you remember on the same day then continue your regularly scheduled regimen the next day.  Do not take two doses of Coumadin at the same time.  Important Safety Information A possible side effect of Coumadin (Warfarin) is an increased risk of bleeding. You should call your healthcare provider right away if you experience any of the following: ? Bleeding from an injury or your nose that does not stop. ? Unusual colored urine (red or dark brown) or unusual colored stools (red or black). ? Unusual bruising for unknown reasons. ? A serious fall or if you hit your head (even if there is no bleeding).  Some foods or medicines interact with Coumadin (warfarin) and might alter your response to warfarin. To help avoid this: ? Eat a balanced diet, maintaining a consistent amount of Vitamin K. ? Notify your provider about major diet changes you plan to make. ? Avoid alcohol or limit your intake to 1 drink for women and 2 drinks for men per day. (1 drink is 5 oz. wine, 12 oz. beer, or 1.5 oz. liquor.)  Make sure that ANY health care provider who prescribes medication for you knows that you are taking Coumadin (warfarin).  Also make sure the healthcare provider who is monitoring your Coumadin knows when you have started a new medication including herbals and non-prescription products.  Coumadin (Warfarin)  Major Drug Interactions  Increased Warfarin  Effect Decreased Warfarin Effect  Alcohol (large quantities) Antibiotics (esp. Septra/Bactrim, Flagyl, Cipro) Amiodarone (Cordarone) Aspirin (ASA) Cimetidine (Tagamet) Megestrol (Megace) NSAIDs (ibuprofen, naproxen, etc.) Piroxicam (Feldene) Propafenone (Rythmol SR) Propranolol (Inderal) Isoniazid (INH) Posaconazole (Noxafil) Barbiturates (Phenobarbital) Carbamazepine (Tegretol) Chlordiazepoxide (Librium) Cholestyramine (Questran) Griseofulvin Oral Contraceptives Rifampin Sucralfate  (Carafate) Vitamin K   Coumadin (Warfarin) Major Herbal Interactions  Increased Warfarin Effect Decreased Warfarin Effect  Garlic Ginseng Ginkgo biloba Coenzyme Q10 Green tea St. Johns wort    Coumadin (Warfarin) FOOD Interactions  Eat a consistent number of servings per week of foods HIGH in Vitamin K (1 serving =  cup)  Collards (cooked, or boiled & drained) Kale (cooked, or boiled & drained) Mustard greens (cooked, or boiled & drained) Parsley *serving size only =  cup Spinach (cooked, or boiled & drained) Swiss chard (cooked, or boiled & drained) Turnip greens (cooked, or boiled & drained)  Eat a consistent number of servings per week of foods MEDIUM-HIGH in Vitamin K (1 serving = 1 cup)  Asparagus (cooked, or boiled & drained) Broccoli (cooked, boiled & drained, or raw & chopped) Brussel sprouts (cooked, or boiled & drained) *serving size only =  cup Lettuce, raw (green leaf, endive, romaine) Spinach, raw Turnip greens, raw & chopped   These websites have more information on Coumadin (warfarin):  FailFactory.se; VeganReport.com.au;

## 2015-11-19 ENCOUNTER — Encounter: Payer: Medicare Other | Admitting: Internal Medicine

## 2015-11-21 IMAGING — CR DG THORACIC SPINE 2V
2 series · 2 of 2 positions shown · non-contrast
Comparison: Chest radiographs 02/08/2014

CLINICAL DATA: Five months of mid to lower back pain with no injury

EXAM:
THORACIC SPINE - 2 VIEW

[t t-spine a.p.]
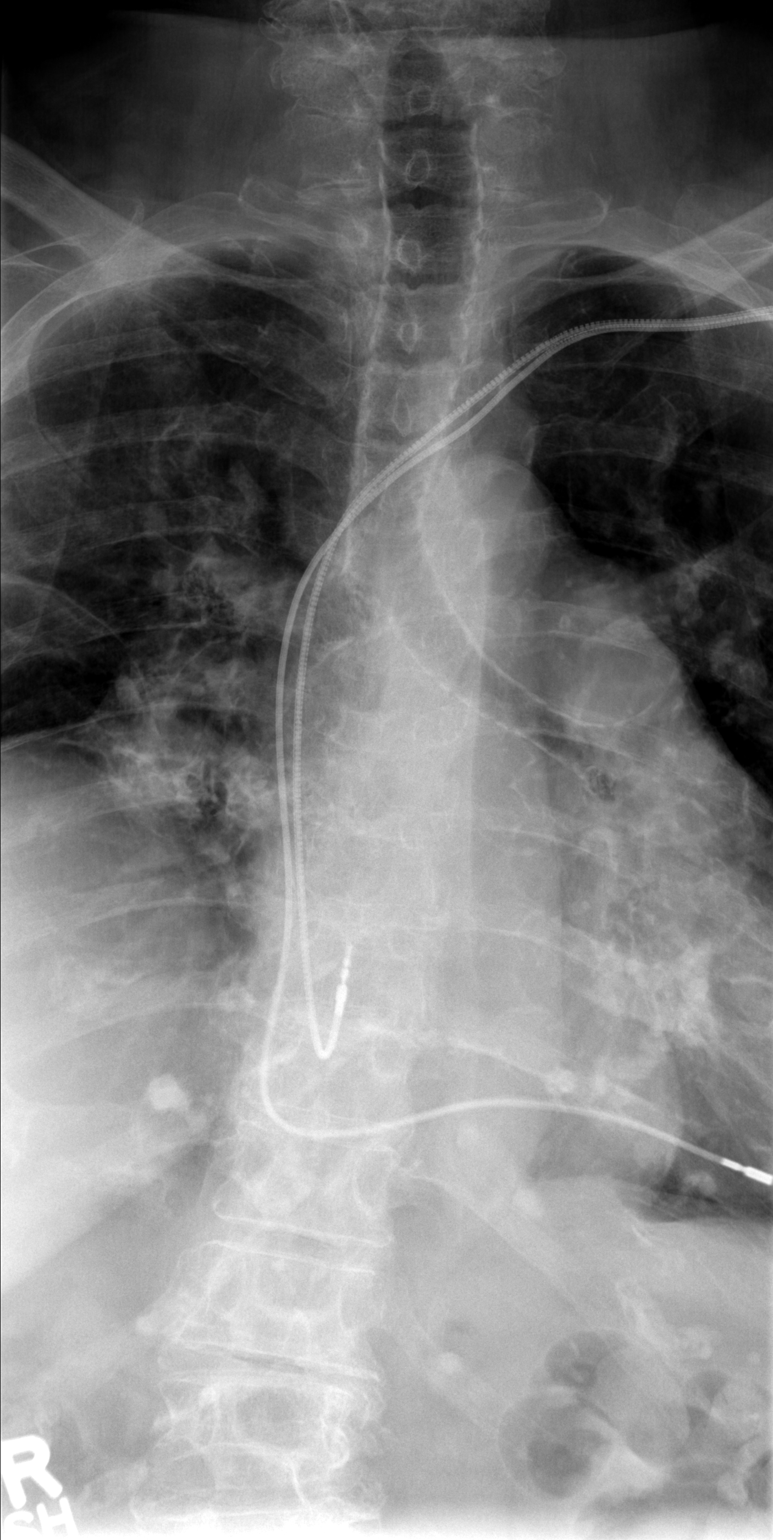

[t t-spine lat]
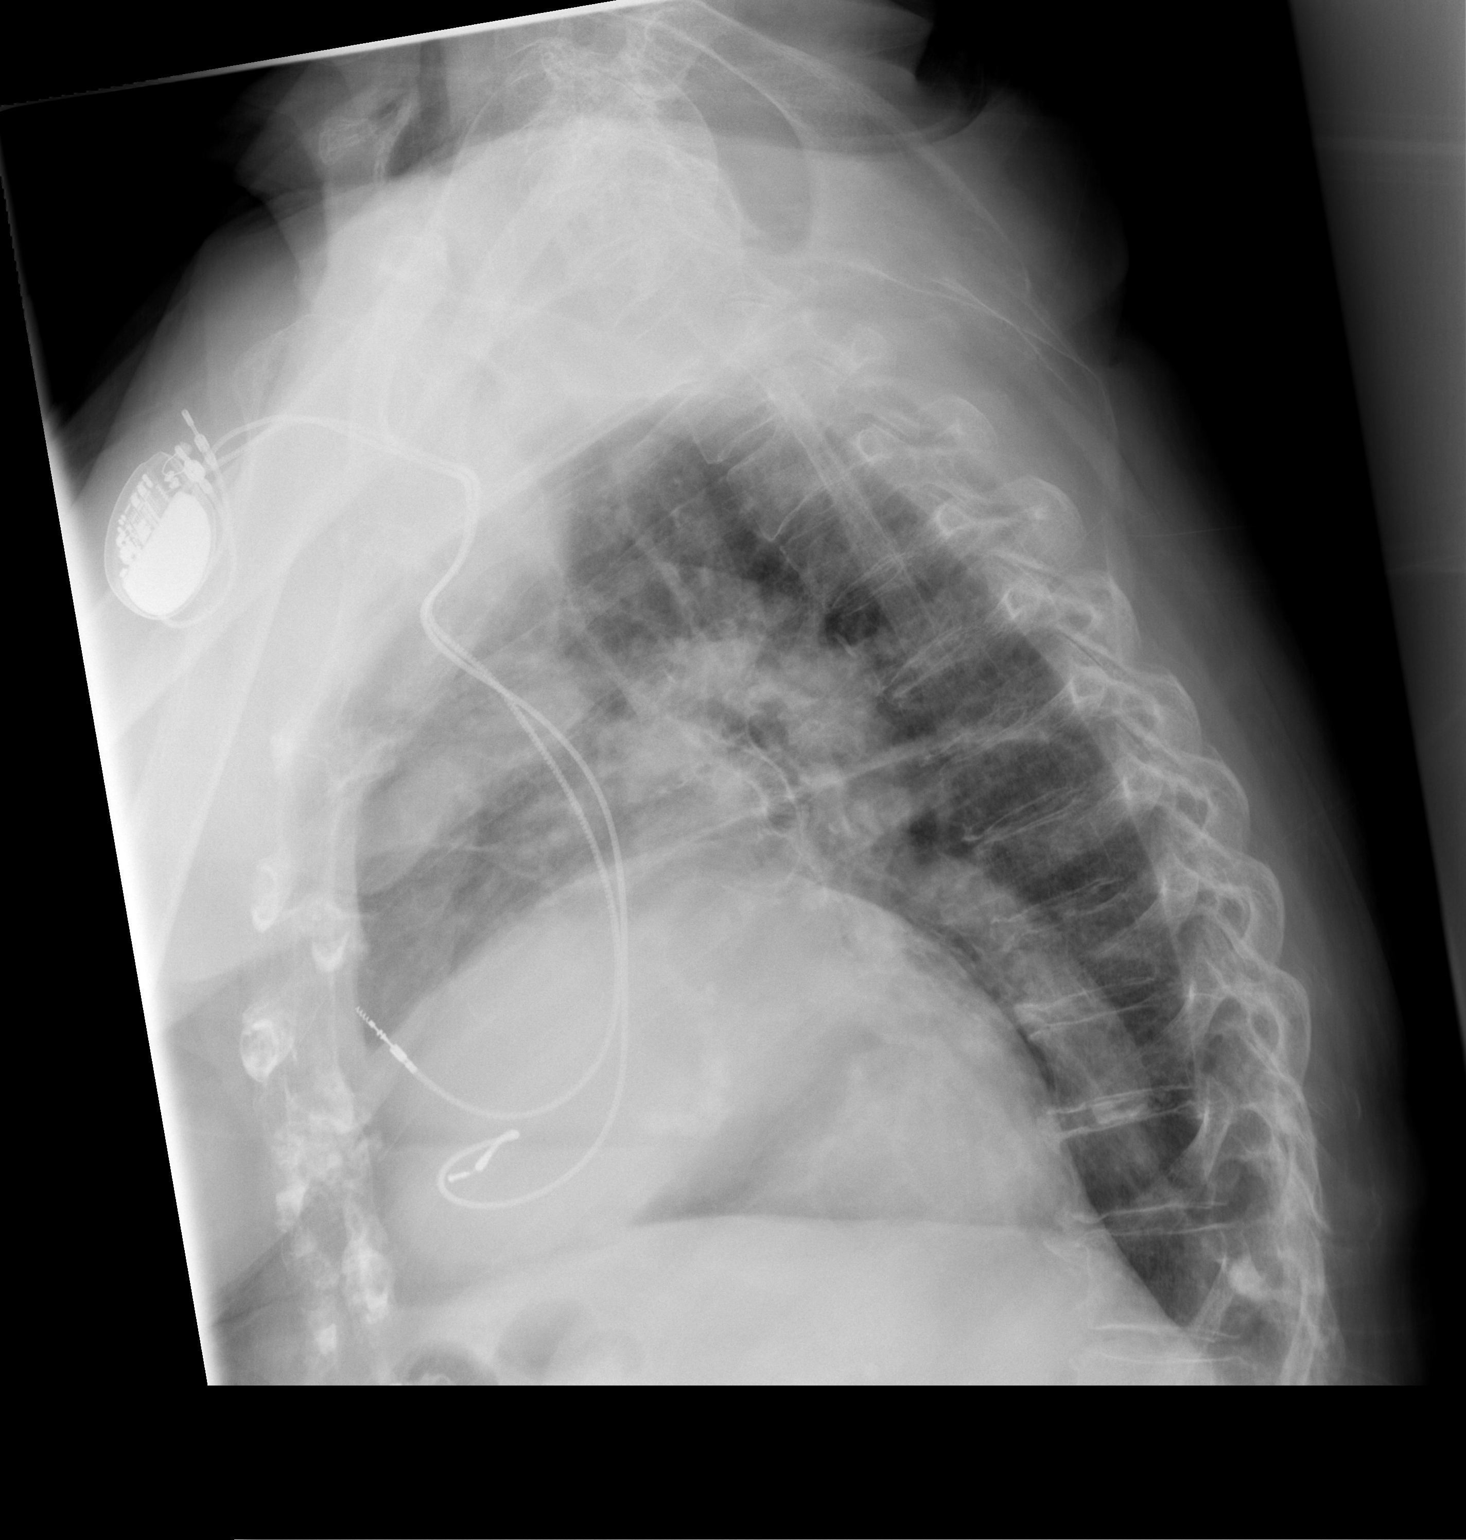

[2 of 2 positions shown; findings below may reference images not displayed]

FINDINGS: Osseous demineralization.

Twelve pairs of ribs.

Scattered disc space narrowing and minimal endplate spur formation.

Slight chronic height loss of the suspect T12 superior endplate,
stable.

Vertebral body heights otherwise maintained without fracture or
subluxation.

Visualized posterior ribs appear intact.

Old granulomatous disease.
IMPRESSION: Osseous demineralization with minimal degenerative disc disease
changes and old minimal superior endplate compression deformity of
approximately T12.

No acute abnormalities.

## 2016-01-12 DEATH — deceased

## 2016-02-04 NOTE — Progress Notes (Signed)
This encounter was created in error - please disregard.

## 2016-02-05 ENCOUNTER — Encounter: Payer: Medicare Other | Admitting: Nurse Practitioner

## 2016-02-26 ENCOUNTER — Encounter: Payer: Self-pay | Admitting: Nurse Practitioner
